# Patient Record
Sex: Female | Born: 1985 | Race: White | Hispanic: No | Smoking: Never smoker
Health system: Southern US, Community
[De-identification: ages and names within clinical notes are randomized; demographics above are authoritative.]

## PROBLEM LIST (undated history)

## (undated) DIAGNOSIS — M545 Low back pain, unspecified: Secondary | ICD-10-CM

## (undated) DIAGNOSIS — K5790 Diverticulosis of intestine, part unspecified, without perforation or abscess without bleeding: Secondary | ICD-10-CM

## (undated) DIAGNOSIS — F419 Anxiety disorder, unspecified: Secondary | ICD-10-CM

## (undated) DIAGNOSIS — E669 Obesity, unspecified: Secondary | ICD-10-CM

## (undated) DIAGNOSIS — F3281 Premenstrual dysphoric disorder: Secondary | ICD-10-CM

## (undated) DIAGNOSIS — G43909 Migraine, unspecified, not intractable, without status migrainosus: Secondary | ICD-10-CM

## (undated) DIAGNOSIS — F32A Depression, unspecified: Secondary | ICD-10-CM

## (undated) DIAGNOSIS — F329 Major depressive disorder, single episode, unspecified: Secondary | ICD-10-CM

## (undated) DIAGNOSIS — K5792 Diverticulitis of intestine, part unspecified, without perforation or abscess without bleeding: Secondary | ICD-10-CM

## (undated) HISTORY — DX: Diverticulitis of intestine, part unspecified, without perforation or abscess without bleeding: K57.92

## (undated) HISTORY — DX: Low back pain, unspecified: M54.50

## (undated) HISTORY — DX: Anxiety disorder, unspecified: F41.9

## (undated) HISTORY — DX: Premenstrual dysphoric disorder: F32.81

## (undated) HISTORY — DX: Migraine, unspecified, not intractable, without status migrainosus: G43.909

## (undated) HISTORY — DX: Low back pain: M54.5

## (undated) HISTORY — DX: Major depressive disorder, single episode, unspecified: F32.9

## (undated) HISTORY — DX: Depression, unspecified: F32.A

## (undated) HISTORY — DX: Diverticulosis of intestine, part unspecified, without perforation or abscess without bleeding: K57.90

## (undated) HISTORY — PX: OTHER SURGICAL HISTORY: SHX169

## (undated) HISTORY — DX: Obesity, unspecified: E66.9

---
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
## 2005-01-25 ENCOUNTER — Inpatient Hospital Stay (HOSPITAL_COMMUNITY): Admission: AD | Admit: 2005-01-25 | Discharge: 2005-01-27 | Payer: Self-pay | Admitting: Obstetrics and Gynecology

## 2005-01-26 ENCOUNTER — Encounter (INDEPENDENT_AMBULATORY_CARE_PROVIDER_SITE_OTHER): Payer: Self-pay | Admitting: Specialist

## 2005-05-10 ENCOUNTER — Other Ambulatory Visit: Admission: RE | Admit: 2005-05-10 | Discharge: 2005-05-10 | Payer: Self-pay | Admitting: Obstetrics and Gynecology

## 2005-05-31 ENCOUNTER — Inpatient Hospital Stay (HOSPITAL_COMMUNITY): Admission: AD | Admit: 2005-05-31 | Discharge: 2005-06-01 | Payer: Self-pay | Admitting: Obstetrics and Gynecology

## 2005-06-10 ENCOUNTER — Inpatient Hospital Stay (HOSPITAL_COMMUNITY): Admission: AD | Admit: 2005-06-10 | Discharge: 2005-06-10 | Payer: Self-pay | Admitting: Obstetrics and Gynecology

## 2006-11-15 ENCOUNTER — Emergency Department: Payer: Self-pay | Admitting: Emergency Medicine

## 2007-01-22 ENCOUNTER — Emergency Department: Payer: Self-pay | Admitting: Internal Medicine

## 2007-02-28 ENCOUNTER — Observation Stay: Payer: Self-pay | Admitting: Obstetrics & Gynecology

## 2007-05-25 ENCOUNTER — Observation Stay: Payer: Self-pay | Admitting: Emergency Medicine

## 2007-07-05 ENCOUNTER — Inpatient Hospital Stay: Payer: Self-pay

## 2007-07-05 ENCOUNTER — Observation Stay: Payer: Self-pay | Admitting: Obstetrics and Gynecology

## 2007-08-27 LAB — CONVERTED CEMR LAB: Pap Smear: NORMAL

## 2007-10-08 ENCOUNTER — Emergency Department: Payer: Self-pay | Admitting: Internal Medicine

## 2008-07-30 ENCOUNTER — Emergency Department: Payer: Self-pay | Admitting: Internal Medicine

## 2008-10-28 ENCOUNTER — Encounter: Payer: Self-pay | Admitting: Family Medicine

## 2008-10-28 LAB — CONVERTED CEMR LAB
Cholesterol: 145 mg/dL
Glucose, Bld: 90 mg/dL
HDL: 49 mg/dL
LDL Cholesterol: 81 mg/dL
Triglycerides: 79 mg/dL

## 2008-11-25 ENCOUNTER — Ambulatory Visit: Payer: Self-pay | Admitting: Family Medicine

## 2008-11-25 DIAGNOSIS — E669 Obesity, unspecified: Secondary | ICD-10-CM | POA: Insufficient documentation

## 2008-11-25 DIAGNOSIS — G43009 Migraine without aura, not intractable, without status migrainosus: Secondary | ICD-10-CM | POA: Insufficient documentation

## 2008-11-25 DIAGNOSIS — J309 Allergic rhinitis, unspecified: Secondary | ICD-10-CM | POA: Insufficient documentation

## 2008-11-25 DIAGNOSIS — L230 Allergic contact dermatitis due to metals: Secondary | ICD-10-CM | POA: Insufficient documentation

## 2009-02-01 ENCOUNTER — Encounter: Payer: Self-pay | Admitting: Family Medicine

## 2009-02-05 ENCOUNTER — Ambulatory Visit: Payer: Self-pay | Admitting: Family Medicine

## 2009-02-05 DIAGNOSIS — H8110 Benign paroxysmal vertigo, unspecified ear: Secondary | ICD-10-CM | POA: Insufficient documentation

## 2009-03-18 ENCOUNTER — Other Ambulatory Visit: Admission: RE | Admit: 2009-03-18 | Discharge: 2009-03-18 | Payer: Self-pay | Admitting: Family Medicine

## 2009-03-18 ENCOUNTER — Ambulatory Visit: Payer: Self-pay | Admitting: Family Medicine

## 2009-03-18 ENCOUNTER — Encounter: Payer: Self-pay | Admitting: Family Medicine

## 2009-03-18 LAB — CONVERTED CEMR LAB
Beta hcg, urine, semiquantitative: NEGATIVE
Bilirubin Urine: NEGATIVE
Glucose, Urine, Semiquant: NEGATIVE
Ketones, urine, test strip: NEGATIVE
Nitrite: NEGATIVE
Specific Gravity, Urine: 1.015
Urobilinogen, UA: 0.2
pH: 6

## 2009-03-22 ENCOUNTER — Encounter (INDEPENDENT_AMBULATORY_CARE_PROVIDER_SITE_OTHER): Payer: Self-pay | Admitting: *Deleted

## 2009-06-08 ENCOUNTER — Ambulatory Visit: Payer: Self-pay | Admitting: Family Medicine

## 2009-06-08 DIAGNOSIS — J069 Acute upper respiratory infection, unspecified: Secondary | ICD-10-CM | POA: Insufficient documentation

## 2009-06-08 DIAGNOSIS — H6980 Other specified disorders of Eustachian tube, unspecified ear: Secondary | ICD-10-CM | POA: Insufficient documentation

## 2009-09-21 ENCOUNTER — Ambulatory Visit: Payer: Self-pay | Admitting: Family Medicine

## 2009-09-21 DIAGNOSIS — M62838 Other muscle spasm: Secondary | ICD-10-CM | POA: Insufficient documentation

## 2009-09-22 LAB — CONVERTED CEMR LAB: TSH: 1.41 microintl units/mL (ref 0.35–5.50)

## 2010-05-12 ENCOUNTER — Ambulatory Visit: Payer: Self-pay | Admitting: Family Medicine

## 2010-05-12 DIAGNOSIS — J310 Chronic rhinitis: Secondary | ICD-10-CM | POA: Insufficient documentation

## 2010-05-24 ENCOUNTER — Ambulatory Visit: Payer: Self-pay | Admitting: Family Medicine

## 2010-05-24 ENCOUNTER — Telehealth (INDEPENDENT_AMBULATORY_CARE_PROVIDER_SITE_OTHER): Payer: Self-pay | Admitting: *Deleted

## 2010-05-27 ENCOUNTER — Ambulatory Visit: Payer: Self-pay | Admitting: Family Medicine

## 2010-05-27 LAB — CONVERTED CEMR LAB
ALT: 19 units/L (ref 0–35)
AST: 16 units/L (ref 0–37)
Albumin: 4.2 g/dL (ref 3.5–5.2)
Alkaline Phosphatase: 50 units/L (ref 39–117)
BUN: 14 mg/dL (ref 6–23)
Bilirubin, Direct: 0.1 mg/dL (ref 0.0–0.3)
CO2: 30 meq/L (ref 19–32)
Calcium: 9 mg/dL (ref 8.4–10.5)
Chloride: 104 meq/L (ref 96–112)
Cholesterol: 163 mg/dL (ref 0–200)
Creatinine, Ser: 0.6 mg/dL (ref 0.4–1.2)
GFR calc non Af Amer: 143.69 mL/min (ref >60)
Glucose, Bld: 69 mg/dL — ABNORMAL LOW (ref 70–99)
HDL: 56.1 mg/dL (ref >39.00)
LDL Cholesterol: 92 mg/dL (ref 0–99)
Potassium: 4.3 meq/L (ref 3.5–5.1)
Sodium: 141 meq/L (ref 135–145)
Total Bilirubin: 1.1 mg/dL (ref 0.3–1.2)
Total CHOL/HDL Ratio: 3
Total Protein: 6.8 g/dL (ref 6.0–8.3)
Triglycerides: 73 mg/dL (ref 0.0–149.0)
VLDL: 14.6 mg/dL (ref 0.0–40.0)

## 2010-08-25 NOTE — Assessment & Plan Note (Signed)
Summary: CPX/DLO   Vital Signs:  Patient profile:   25 year old female Height:      63 inches Weight:      203.38 pounds BMI:     36.16 Temp:     98.3 degrees F oral Pulse rate:   76 / minute Pulse rhythm:   regular BP sitting:   94 / 62  (left arm) Cuff size:   large  Vitals Entered By: Delilah Shan CMA Duncan Dull) (March 18, 2009 11:36 AM) CC: Check Up  -  Pap   History of Present Illness: Vertigo resolved. Stopped phentermine.  Migraines occuring rarely now.  Working on exercisie and healthy eating...weight watchers started at work. Working on diet...with supprot from friends.   Has not had period in 1.5 months. Sexually active. No breast achiness, no N/V, no moodyness. On mirena x 2 years  Not interested in full STD testing, monogomous.   Preventive Screening-Counseling & Management  Alcohol-Tobacco     Alcohol drinks/day: <1     Smoking Status: never  Caffeine-Diet-Exercise     Diet Counseling: to improve diet; diet is suboptimal     Does Patient Exercise: no     Exercise Counseling: not indicated; exercise is adequate  Current Medications (verified): 1)  Mirena 20 Mcg/24hr Iud (Levonorgestrel) 2)  Fluticasone Propionate 50 Mcg/act Susp (Fluticasone Propionate) .... 2 Sprays Per Nostril Daily As Needed 3)  Triamcinolone Acetonide 0.5 % Crea (Triamcinolone Acetonide) .... Aaa Two Times A Day X 2 Weeks 4)  Meclizine Hcl 25 Mg Tabs (Meclizine Hcl) .Marland Kitchen.. 1 Tab By Mouth Q 6 Hours As Needed Vertigo  Allergies (verified): No Known Drug Allergies  Past History:  Past medical, surgical, family and social histories (including risk factors) reviewed, and no changes noted (except as noted below).  Past Medical History: Reviewed history from 11/25/2008 and no changes required. Allergic rhinitis  Family History: Reviewed history from 11/25/2008 and no changes required. father: DM, kidney failure, HTN mother: healthy 2 sisters:healthy except one with migraines 1  brother: healthy PGM:CAD PGF:CAD no cancer in family  Social History: Reviewed history from 11/25/2008 and no changes required. 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  Review of Systems       no breast lumps, no vaginal discharge or itching, no dysuria.  General:  Denies fatigue and fever. CV:  Denies chest pain or discomfort. Resp:  Denies shortness of breath. GI:  Denies abdominal pain, bloody stools, constipation, and diarrhea.  Physical Exam  General:  overweight female in NAD Eyes:  No corneal or conjunctival inflammation noted. EOMI. Perrla. Funduscopic exam benign, without hemorrhages, exudates or papilledema. Vision grossly normal. Ears:  External ear exam shows no significant lesions or deformities.  Otoscopic examination reveals clear canals, tympanic membranes are intact bilaterally without bulging, retraction, inflammation or discharge. Hearing is grossly normal bilaterally. Nose:  External nasal examination shows no deformity or inflammation. Nasal mucosa are pink and moist without lesions or exudates. Mouth:  Oral mucosa and oropharynx without lesions or exudates.  Teeth in good repair. Neck:  no carotid bruit or thyromegaly no cervical or supraclavicular lymphadenopathy  Chest Wall:  No deformities, masses, or tenderness noted. Breasts:  No mass, nodules, thickening, tenderness, bulging, retraction, inflamation, nipple discharge or skin changes noted.   Lungs:  Normal respiratory effort, chest expands symmetrically. Lungs are clear to auscultation, no crackles  or wheezes. Heart:  Normal rate and regular rhythm. S1 and S2 normal without gallop, murmur, click, rub or other extra sounds. Genitalia:  Normal introitus for age, no external lesions, no vaginal discharge, mucosa pink and moist, no vaginal or  cervical lesions, no vaginal atrophy, no friaility or hemorrhage, normal uterus size and position, no adnexal masses or tenderness Msk:  No deformity or scoliosis noted of thoracic or lumbar spine.   Pulses:  R and L posterior tibial pulses are full and equal bilaterally  Extremities:  No clubbing, cyanosis, edema, or deformity noted with normal full range of motion of all joints.   Neurologic:  No cranial nerve deficits noted. Station and gait are normal. Plantar reflexes are down-going bilaterally. DTRs are symmetrical throughout. Sensory, motor and coordinative functions appear intact. Skin:  erythematous papules and excoriations central abdomen right at belt buckle...nowhere else..improved with steroids, but returned...never changed belt or jeans closure  Psych:  Cognition and judgment appear intact. Alert and cooperative with normal attention span and concentration. No apparent delusions, illusions, hallucinations   Impression & Recommendations:  Problem # 1:  Preventive Health Care (ICD-V70.0) Reviewed preventive care protocols, scheduled due services, and updated immunizations. Encouraged exercise, weight loss, healthy eating habits.   Problem # 2:  Gynecological examination-routine (ICD-V72.31) PAP pending. Routine GC/chlam.   Complete Medication List: 1)  Mirena 20 Mcg/24hr Iud (Levonorgestrel) 2)  Fluticasone Propionate 50 Mcg/act Susp (Fluticasone propionate) .... 2 sprays per nostril daily as needed 3)  Triamcinolone Acetonide 0.5 % Crea (Triamcinolone acetonide) .... Aaa two times a day x 2 weeks 4)  Meclizine Hcl 25 Mg Tabs (Meclizine hcl) .Marland Kitchen.. 1 tab by mouth q 6 hours as needed vertigo  Current Allergies (reviewed today): No known allergies     Past Medical History:    Reviewed history from 11/25/2008 and no changes required:       Allergic rhinitis   Laboratory Results   Urine Tests  Date/Time Received: March 18, 2009 12:14 PM   Routine Urinalysis   Color:  yellow Appearance: Hazy Glucose: negative   (Normal Range: Negative) Bilirubin: negative   (Normal Range: Negative) Ketone: negative   (Normal Range: Negative) Spec. Gravity: 1.015   (Normal Range: 1.003-1.035) Blood: trace-lysed   (Normal Range: Negative) pH: 6.0   (Normal Range: 5.0-8.0) Protein: trace   (Normal Range: Negative) Urobilinogen: 0.2   (Normal Range: 0-1) Nitrite: negative   (Normal Range: Negative) Leukocyte Esterace: moderate   (Normal Range: Negative)    Urine HCG: negative

## 2010-08-25 NOTE — Miscellaneous (Signed)
  Clinical Lists Changes  Observations: Added new observation of BG RANDOM: 90 mg/dL (16/04/9603 54:09) Added new observation of LDL: 81 mg/dL (81/19/1478 29:56) Added new observation of HDL: 49 mg/dL (21/30/8657 84:69) Added new observation of TRIGLYC TOT: 79 mg/dL (62/95/2841 32:44) Added new observation of CHOLESTEROL: 145 mg/dL (07/26/7251 66:44) Added new observation of BP DIASTOLIC: 70 mmHg (10/28/2008 03:47) Added new observation of BP SYSTOLIC: 104 mmHg (10/28/2008 14:25)

## 2010-08-25 NOTE — Progress Notes (Signed)
----   Converted from flag ---- ---- 05/24/2010 2:21 PM, Kerby Nora MD wrote: CMEt, lipids Dx v77.91  ---- 05/23/2010 4:06 PM, Mills Koller wrote: This patient is scheduled for CPX with you, I need lab orders with dx, please. Thanks, Terri ------------------------------

## 2010-08-25 NOTE — Assessment & Plan Note (Signed)
Summary: DIZZY/CLE   Vital Signs:  Patient profile:   25 year old female Height:      63 inches Weight:      201 pounds BMI:     35.73 Temp:     98.1 degrees F oral Pulse rate:   80 / minute Pulse rhythm:   regular BP sitting:   112 / 80  (left arm) Cuff size:   large  Vitals Entered By: Delilah Shan CMA (February 05, 2009 9:38 AM) CC: Dizziness, vomiting, diarrhea   History of Present Illness: Recently stopped HCG for weight loss...stopped in 7/1 Has been on phentermine x 2 months  Acute Visit History:      The patient complains of diarrhea, nausea, and vomiting.  These symptoms began one day ago.  She denies abdominal pain, earache, fever, headache, and sinus problems.  Other comments include: Initially began with dizzyness with moving, rolling over in bed...room spinning. This AM nausea and vomited once. One episode of loose BM, watery. No sick contacts. .        Problems Prior to Update: 1)  Contact Dermatitis Due To Metals  (EAV-409.81) 2)  Morbid Obesity  (ICD-278.01) 3)  Migraine, Common  (ICD-346.10) 4)  Allergic Rhinitis  (ICD-477.9)  Current Medications (verified): 1)  Mirena 20 Mcg/24hr Iud (Levonorgestrel) 2)  Fluticasone Propionate 50 Mcg/act Susp (Fluticasone Propionate) .... 2 Sprays Per Nostril Daily As Needed 3)  Triamcinolone Acetonide 0.5 % Crea (Triamcinolone Acetonide) .... Aaa Two Times A Day X 2 Weeks 4)  Phentermine Hcl 37.5 Mg Tabs (Phentermine Hcl) .... Take 1 Tablet By Mouth Once A Day 5)  Meclizine Hcl 25 Mg Tabs (Meclizine Hcl) .Marland Kitchen.. 1 Tab By Mouth Q 6 Hours As Needed Vertigo  Allergies (verified): No Known Drug Allergies  Past History:  Past medical, surgical, family and social histories (including risk factors) reviewed, and no changes noted (except as noted below).  Past Medical History: Reviewed history from 11/25/2008 and no changes required. Allergic rhinitis  Family History: Reviewed history from 11/25/2008 and no changes  required. father: DM, kidney failure, HTN mother: healthy 2 sisters:healthy except one with migraines 1 brother: healthy PGM:CAD PGF:CAD no cancer in family  Social History: Reviewed history from 11/25/2008 and no changes required. 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  Review of Systems General:  Denies fatigue, fever, and loss of appetite. CV:  Denies chest pain or discomfort. Resp:  Denies shortness of breath.  Physical Exam  General:  Well-developed,well-nourished,in no acute distress; alert,appropriate and cooperative throughout examination Head:  no maxillary sinus ttp Eyes:  No corneal or conjunctival inflammation noted. EOMI. Perrla. Funduscopic exam benign, without hemorrhages, exudates or papilledema. Vision grossly normal. Nystagmus horizontal noted with Gilberto Better.  Ears:  External ear exam shows no significant lesions or deformities.  Otoscopic examination reveals clear canals, tympanic membranes are intact bilaterally without bulging, retraction, inflammation or discharge. Hearing is grossly normal bilaterally. Nose:  External nasal examination shows no deformity or inflammation. Nasal mucosa are pink and moist without lesions or exudates. Mouth:  MMM Neck:  no carotid bruit or thyromegaly no cervical or supraclavicular lymphadenopathy  Lungs:  Normal respiratory effort, chest expands symmetrically. Lungs are clear to auscultation, no crackles or wheezes. Heart:  Normal rate and regular rhythm. S1 and S2 normal without gallop, murmur, click, rub or other extra sounds. Abdomen:  Bowel sounds positive,abdomen soft and non-tender without masses, organomegaly or hernias noted.   Impression & Recommendations:  Problem # 1:  BENIGN POSITIONAL VERTIGO (ICD-386.11) Discussed likely diagnosis in detail.  Possibly due to phentermine...recommend stopping. ? if diarrhea from phentermine as well. Info on moveemnt exercises, use meclizine as needed. Call if not improving in 2 weeks.  The following medications were removed from the medication list:    Claritin 10 Mg Caps (Loratadine) .Marland Kitchen... Take 1 tablet by mouth once a day Her updated medication list for this problem includes:    Meclizine Hcl 25 Mg Tabs (Meclizine hcl) .Marland Kitchen... 1 tab by mouth q 6 hours as needed vertigo  Complete Medication List: 1)  Mirena 20 Mcg/24hr Iud (Levonorgestrel) 2)  Fluticasone Propionate 50 Mcg/act Susp (Fluticasone propionate) .... 2 sprays per nostril daily as needed 3)  Triamcinolone Acetonide 0.5 % Crea (Triamcinolone acetonide) .... Aaa two times a day x 2 weeks 4)  Phentermine Hcl 37.5 Mg Tabs (Phentermine hcl) .... Take 1 tablet by mouth once a day 5)  Meclizine Hcl 25 Mg Tabs (Meclizine hcl) .Marland Kitchen.. 1 tab by mouth q 6 hours as needed vertigo  Patient Instructions: 1)  Stop phentermine. 2)  Use meclize as needed severe symptoms..willl be sedating. 3)  Info on movement exercsies given. Call if not improving in 2 weeks.  Prescriptions: MECLIZINE HCL 25 MG TABS (MECLIZINE HCL) 1 tab by mouth q 6 hours as needed vertigo  #20 x 0   Entered and Authorized by:   Kerby Nora MD   Signed by:   Kerby Nora MD on 02/05/2009   Method used:   [facility] to        [facility]  [address] (retail)       [address]       [place], [place]  [address]       Ph: [phone] or [phone]       Fax: [phone]   RxID:   [id number]   Current Allergies (reviewed today): No known allergies

## 2010-08-25 NOTE — Assessment & Plan Note (Signed)
Summary: NEW PATIENT / LFW   Vital Signs:  Patient profile:   25 year old female Height:      63 inches Weight:      224.13 pounds BMI:     39.85 Temp:     98.4 degrees F oral Pulse rate:   76 / minute Pulse rhythm:   regular BP sitting:   102 / 70  (left arm) Cuff size:   large  Vitals Entered By: Delilah Shan (Nov 25, 2008 10:00 AM)  History of Present Illness: Chief Complaint:  New Patient to Establish   Here to establish, but has the following concerns:  Increasing frequency of headaches as well as poorly controlled allergies. She has not tried any antihistamines.  headaches x years, but more frequewnt in alst month daily to weekly. Feels pain behind eyes, B and tightness in neck. Occ nausea, has photophonophobia. Has recently gotten new glasses prescription. Skips meals. Has frequent congestion, itchy throat, sneezing ...triggers headache.    Mirena placed 08/2007  Health screening at work last month...cholesterol normal, neg DM screen. Has appt with bariatric clinic.  Preventive Screening-Counseling & Management     Smoking Status: never     Does Patient Exercise: no      Drug Use:  no.    Allergies (verified): No Known Drug Allergies  Past History:  Past Medical History:    Allergic rhinitis  Family History:    father: DM, kidney failure, HTN    mother: healthy    2 sisters:healthy except one with migraines    1 brother: healthy    PGM:CAD    PGF:CAD    no cancer in family  Social History:    Occupation: Clinical biochemist    Married    2 children: healthy    Never Smoked    Alcohol use-yes, 1-2 drink every other week    Drug use-no    Regular exercise-no    Diet; fruits and veggies..but just eats 1 meal a day.    Occupation:  employed    Smoking Status:  never    Drug Use:  no    Does Patient Exercise:  no  Review of Systems       rash on stomach, intermitant puritis General:  Denies fatigue and fever. CV:  Denies chest pain or  discomfort, fatigue, leg cramps with exertion, lightheadness, palpitations, and swelling of feet; occ has swelling in hand..with walking during walking x 4 months, intermittant. Resp:  Denies shortness of breath. Derm:  Denies rash.  Physical Exam  General:  obese appearin female in NAD Eyes:  No corneal or conjunctival inflammation noted. EOMI. Perrla. Funduscopic exam benign, without hemorrhages, exudates or papilledema. Vision grossly normal. Ears:  External ear exam shows no significant lesions or deformities.  Otoscopic examination reveals clear canals, tympanic membranes are intact bilaterally without bulging, retraction, inflammation or discharge. Hearing is grossly normal bilaterally. Nose:  nasal dischargemucosal pallor.   Mouth:  Oral mucosa and oropharynx without lesions or exudates.  Teeth in good repair. Neck:  no carotid bruit or thyromegaly  no cervical or supraclavicular lymphadenopathy  Lungs:  Normal respiratory effort, chest expands symmetrically. Lungs are clear to auscultation, no crackles or wheezes. Heart:  Normal rate and regular rhythm. S1 and S2 normal without gallop, murmur, click, rub or other extra sounds. Abdomen:  Bowel sounds positive,abdomen soft and non-tender without masses, organomegaly or hernias noted. Pulses:  R and L posterior tibial pulses are full and equal bilaterally  Extremities:  no edema Neurologic:  No cranial nerve deficits noted. Station and gait are  Plantar reflexes are down-going bilaterally. DTRs are symmetrical throughout. Sensory, motor and coordinative functions appear intact. Skin:  erythematous papules and excoriations central abdomen right at belt buckle...nowhere else   Impression & Recommendations:  Problem # 1:  ALLERGIC RHINITIS (ICD-477.9) Start OTC antihoistamine ans nasal steroid.  Her updated medication list for this problem includes:    Fluticasone Propionate 50 Mcg/act Susp (Fluticasone propionate) .Marland Kitchen... 2 sprays per  nostril daily    Claritin 10 Mg Caps (Loratadine) .Marland Kitchen... Take 1 tablet by mouth once a day  Problem # 2:  MIGRAINE, COMMON (ICD-346.10) Info given on migrianes and triggers. Encouraged regular meals, increasing water intake, good sleep. Will keep headache diary. Treat allergy trigger as above. Follow up in 3-4 weeks.   Problem # 3:  CONTACT DERMATITIS DUE TO METALS (VHQ-469.62) Treat with topical steroid and removal of allergen.  Her updated medication list for this problem includes:    Claritin 10 Mg Caps (Loratadine) .Marland Kitchen... Take 1 tablet by mouth once a day    Triamcinolone Acetonide 0.5 % Crea (Triamcinolone acetonide) .Marland Kitchen... Aaa two times a day x 2 weeks  Complete Medication List: 1)  Mirena 20 Mcg/24hr Iud (Levonorgestrel) 2)  Fluticasone Propionate 50 Mcg/act Susp (Fluticasone propionate) .... 2 sprays per nostril daily 3)  Claritin 10 Mg Caps (Loratadine) .... Take 1 tablet by mouth once a day 4)  Triamcinolone Acetonide 0.5 % Crea (Triamcinolone acetonide) .... Aaa two times a day x 2 weeks  Patient Instructions: 1)  Schedule CPE with pap. 2)  Stop wearing belt buckle if possible, cover metal on jeans. 3)  Use steroid creasm two times a day x 2 weeks, call if not improving. 4)  Eat 3 meals a day. 5)  keep headache diary, review triggers. 6)  Start claritin or zyrtec and nasal steroid for allergies. Follow up in 3-4 weeks.  Prescriptions: TRIAMCINOLONE ACETONIDE 0.5 % CREA (TRIAMCINOLONE ACETONIDE) AAA two times a day x 2 weeks  #15 gm x 1   Entered and Authorized by:   Kerby Nora MD   Signed by:   Kerby Nora MD on 11/25/2008   Method used:   [facility]        [id number]       [address]       [place], [place]  [address]       Ph: [phone] or [phone]       Fax: [phone]   RxID:   [id number] FLUTICASONE PROPIONATE 50 MCG/ACT SUSP (FLUTICASONE PROPIONATE) 2 sprays per nostril daily  #1 x 3   Entered and Authorized by:    Kerby Nora MD   Signed by:   Kerby Nora MD on 11/25/2008   Method used:   [facility]        [facility] [id number]       [address]       [place], [place]  [address]       Ph: [phone] or [phone]       Fax: [phone]   RxID:   [id number]     Prior Medications (reviewed today): None Current Allergies (reviewed today): No known allergies  TD Result Date:  01/21/2005 TD Result:  given TD Next Due:  10 yr PAP Result Date:  08/27/2007 PAP Result:  normal PAP Next Due:  1 yr Chlamydia Result Date:  08/27/2007 Chlamydia Result:  normal Chlamydia Next Due:  1 yr

## 2010-08-25 NOTE — Assessment & Plan Note (Signed)
Summary: FEELING LIGHT HEADED & MIGRANES / LFW   Vital Signs:  Patient profile:   25 year old female Height:      63 inches Weight:      203.4 pounds BMI:     36.16 Temp:     97.9 degrees F oral Pulse rate:   76 / minute Pulse rhythm:   regular BP sitting:   110 / 70  (left arm) Cuff size:   large  Vitals Entered By: Benny Lennert CMA Duncan Dull) (June 08, 2009 3:40 PM)  History of Present Illness: Chief complaint feeling light headed and migraines  3 days ago migraine. Has some URI symptoms...nasal congestion, deep cough, productive (keeping her up at night), no ear pain, no fever, no SOB. Daughters are sick.  3 days of lightheadeness.. does ot feel likel room spinning..like in past with vertigo (this is not as bad). Has not been very hungry ..drinking liquids.   No heavy periods. Prior to above symtpoms she had been feeling well, no fatigue. Has taken some Nyquil and cold medicine.Marland Kitchenanalgesic, decongestant cough suppressant combo.   No sneeze, no itchy eyes.    Problems Prior to Update: 1)  Routine Gynecological Examination  (ICD-V72.31) 2)  Physical Examination  (ICD-V70.0) 3)  Benign Positional Vertigo  (ICD-386.11) 4)  Contact Dermatitis Due To Metals  (AOZ-308.65) 5)  Morbid Obesity  (ICD-278.01) 6)  Migraine, Common  (ICD-346.10) 7)  Allergic Rhinitis  (ICD-477.9)  Current Medications (verified): 1)  Mirena 20 Mcg/24hr Iud (Levonorgestrel)  Allergies (verified): No Known Drug Allergies  Past History:  Past medical, surgical, family and social histories (including risk factors) reviewed for relevance to current acute and chronic problems.  Past Medical History: Reviewed history from 11/25/2008 and no changes required. Allergic rhinitis  Family History: Reviewed history from 11/25/2008 and no changes required. father: DM, kidney failure, HTN mother: healthy 2 sisters:healthy except one with migraines 1 brother: healthy PGM:CAD PGF:CAD no cancer in  family  Social History: Reviewed history from 11/25/2008 and no changes required. 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  Review of Systems      See HPI CV:  Denies chest pain or discomfort. GI:  Denies abdominal pain and bloody stools.  Physical Exam  General:  Well-developed,well-nourished,in no acute distress; alert,appropriate and cooperative throughout examination Head:  no maxillary ttp Ears:  clear yellowish fluid B TMs, no erythema, no bulging Nose:  nasal discharge, no mucosal pallor.   Mouth:  Oral mucosa and oropharynx without lesions or exudates.  Teeth in good repair. MMM Neck:  no carotid bruit or thyromegaly no cervical or supraclavicular lymphadenopathy  Lungs:  Normal respiratory effort, chest expands symmetrically. Lungs are clear to auscultation, no crackles or wheezes. Heart:  Normal rate and regular rhythm. S1 and S2 normal without gallop, murmur, click, rub or other extra sounds. Pulses:  R and L posterior tibial pulses are full and equal bilaterally  Extremities:  no edema   Impression & Recommendations:  Problem # 1:  URI (ICD-465.9) Treat symptomatically. No clear sign of bactrerial infection.   Problem # 2:  EUSTACHIAN TUBE DYSFUNCTION, BILATERAL (ICD-381.81) Likely cause of equilibrium issues... treatr with decongestant, mucolytic and nasal saline. Stop Nyquil, will not prescribe prescription cough suppressant to avoid ? SE.   No clearly BPPV as she has had in past.   Complete Medication List: 1)  Mirena 20 Mcg/24hr Iud (Levonorgestrel)  Patient Instructions: 1)  Mucinex D two times a day . 2)  Nasal saline 2-3 times a day. 3)  Call if not improving in 7-10 days or if symptoms worsening.   Current Allergies (reviewed today): No known allergies

## 2010-08-25 NOTE — Letter (Signed)
Summary: Results Follow up Letter  [facility]  [address] [place], [place] [address]   Phone: [phone]  Fax: [phone]    03/22/2009 MRN: [id number]    Tiffany Donovan [address] [place], [place]  [address]    Dear Ms. SHAW,  The following are the results of your recent test(s):  Test         Result    Pap Smear:        Normal __X___  Not Normal _____ Comments:  Yearly follow up is recommended. ______________________________________________________ Cholesterol: LDL(Bad cholesterol):         Your goal is less than:         HDL (Good cholesterol):       Your goal is more than: Comments:  ______________________________________________________ Mammogram:        Normal _____  Not Normal _____ Comments:  ___________________________________________________________________ Hemoccult:        Normal _____  Not normal _______ Comments:    _____________________________________________________________________ Other Tests:  GC/Chlamydia tests are negative as well.    We routinely do not discuss normal results over the telephone.  If you desire a copy of the results, or you have any questions about this information we can discuss them at your next office visit.   Sincerely,   Excell Seltzer, M.D.  AEB:lsf

## 2010-08-25 NOTE — Assessment & Plan Note (Signed)
Summary: LEFT SIDE OF FACE SWOLLEN/DLO   Vital Signs:  Patient profile:   25 year old female Height:      63 inches Weight:      237.25 pounds BMI:     42.18 Temp:     97.8 degrees F oral Pulse rate:   80 / minute Pulse rhythm:   regular BP sitting:   98 / 70  (left arm) Cuff size:   large  Vitals Entered By: Delilah Shan CMA Valente Fosberg Dull) (May 12, 2010 4:25 PM) CC: Left side of face swollen x 3 days, progressively getting worse.   History of Present Illness: L side of face puffy.  Under L eye, L lateral portion of nose. Also slightly ttp under L angle of mandible.  No FCNAV. No ear pain.  +rhinorrhea, no ST.  No change in tongue.  No cough.  L eye watering.  No trouble swallowing.  Last night the L side of the nose felt warm.   no increase in wob. No vision change.  No change in eye movement.   No similar symptoms prev.  Rare ibuprofen use.  No new meds.  No known triggers.    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  Review of Systems       See HPI.  Otherwise negative.    Physical Exam  General:  GEN: nad, alert and oriented HEENT: mucous membranes moist, TM wnl bilateral, EOMI bilateral, fundus wnl bilateral, L lateral portion of nostril slightly edematous but not erythematous.  No fluctuance.  sinuses not tender to percussion. OP w/o exudates.   NECK: supple w/o LA.  She is slightly tender to palpation on neck inf to L angle of jaw.  No mass appreciated.  no stidor CV: rrr.  no murmur PULM: ctab, no inc wob ABD: soft, +bs EXT: no edema SKIN: no acute rash  CN 2-12 wnl B   Impression & Recommendations:  Problem # 1:  RHINITIS (ICD-472.0) Unclear source.  I think this is likely a viral URI with rhinitis and congestion of nasal mucosa.  This has  probably compressed the tear duct and caused the watering in the eye.  She may have a tender node on the L side of the neck that I can't fully palpate.  I would observe as patient is nontoxic.  Call back Monday.  If not improving, consider ENT eval.  If resolving, no other work up.  If fever, SOB, erythema, change in vision over the next few days, then proceed to ER. She understands.    Complete Medication List: 1)  Mirena 20 Mcg/24hr Iud (Levonorgestrel) 2)  Ibuprofen 800 Mg Tabs (Ibuprofen) .Marland Kitchen.. 1 tab by mouth up to  three times daily as needed neck pain.  Patient Instructions: 1)  Call me with an update on Monday.  If you have any fever, redness, change in swallowing/breathing/vision over the weekend then go to the ER.  I think this will likely resolve on its own.  Take care.    Orders Added: 1)  Est. Patient Level III [98119]    Current Allergies (reviewed today): No known allergies

## 2010-08-25 NOTE — Assessment & Plan Note (Signed)
Summary: pain in neck and shoulders/ alc   Vital Signs:  Patient profile:   25 year old female Height:      63 inches Weight:      221.0 pounds BMI:     39.29 Temp:     98.1 degrees F oral Pulse rate:   76 / minute Pulse rhythm:   regular BP sitting:   100 / 70  (left arm) Cuff size:   large  Vitals Entered By: Benny Lennert CMA Duncan Dull) (September 21, 2009 9:29 AM)  History of Present Illness: Chief complaint pain in neck and shoulders for 1 month  Work at computer every day all day. In last month: Has central upper back pain and pain in B uppershoulders. No fall, no injury.  Occ use excedrine, massage..helps some, but then it comes back. Worse at nightand at work.  No headache. No numbness, no tingling in arms. No weakness in arms. No fever, no incontinence. no N/V.   Worried about weight ...walks at lunch, plays with kids outside.   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  Past Medical History: Reviewed history from 11/25/2008 and no changes required. Allergic rhinitis  Family History: Reviewed history from 11/25/2008 and no changes required. father: DM, kidney failure, HTN mother: healthy 2 sisters:healthy except one with migraines 1 brother: healthy PGM:CAD PGF:CAD no cancer in family  Social History: Reviewed history from  11/25/2008 and no changes required. 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  Review of Systems General:  Complains of fatigue; denies fever. CV:  Denies chest pain or discomfort. Resp:  Denies shortness of breath. GI:  Denies abdominal pain. Neuro:  Denies difficulty with concentration, falling down, headaches, and poor balance. Endo:  Complains of weight change; denies cold intolerance and heat intolerance.  Physical Exam  General:  morbidly opbese appearing female iN NAD Mouth:  MMM Neck:  no carotid bruit or thyromegaly no cervical or supraclavicular lymphadenopathy  Lungs:  Normal respiratory effort, chest expands symmetrically. Lungs are clear to auscultation, no crackles or wheezes. Heart:  Normal rate and regular rhythm. S1 and S2 normal without gallop, murmur, click, rub or other extra sounds. Msk:  ttp B trapeziu, no vertebral tenderness.  neg [place] Neurologic:  No cranial nerve deficits noted. Station and gait are normal. DTRs are symmetrical throughout. Sensory, motor and coordinative functions appear intact.   Impression & Recommendations:  Problem # 1:  MUSCLE SPASM, TRAPEZIUS (ICD-728.85) No evidence of radiuculopathy. Likely caused by poor posture and ergonomics at work.  NSAID, heat, massage and stretching exercsies. Follow up if not improving in 2 weeks.   Problem # 2:  MORBID OBESITY (ICD-278.01) Spent 30 min face to face with pt conunseling on diet and wexecise changes. Cautioned her about stimulant use. Consider alli. Return  for weight check in 2 months.  Orders: TLB-TSH (Thyroid Stimulating Hormone) (84443-TSH)  Complete Medication List: 1)  Mirena 20 Mcg/24hr Iud (Levonorgestrel) 2)  Ibuprofen 800 Mg Tabs (Ibuprofen) .Marland Kitchen.. 1 tab by mouth up to  three times daily as needed neck pain.  Patient  Instructions: 1)  Follow up in 2 weeks if no improvement. 2)  Heat, massage, ibuprofen as needed pain. 3)  Start stretching exercsies. Look into correct ergonomics at works.  4)  Sit up straight. 5)  Increase exercsie.  6)  Try Alli.  7)  Follow upin 2 months for appt with Alexina Niccoli weight check. Prescriptions: IBUPROFEN 800 MG TABS (IBUPROFEN) 1 tab by mouth up to  three times daily as needed neck pain.  #30 x 0   Entered and Authorized by:   Kerby Nora MD   Signed by:   Kerby Nora MD on 09/21/2009   Method used:   Elect[facility] to        [facility]  [address] (retail)       [address]       [place], [place]  [address]       Ph: [phone] or [phone]       Fax: [phone]   RxID:   [id number]   Current Allergies (reviewed today): No known allergies

## 2010-09-03 ENCOUNTER — Inpatient Hospital Stay (INDEPENDENT_AMBULATORY_CARE_PROVIDER_SITE_OTHER)
Admission: RE | Admit: 2010-09-03 | Discharge: 2010-09-03 | Disposition: A | Discharge status: Home / Self Care | Payer: BC Managed Care – PPO | Source: Ambulatory Visit | Attending: Emergency Medicine | Admitting: Emergency Medicine

## 2010-09-03 DIAGNOSIS — J029 Acute pharyngitis, unspecified: Secondary | ICD-10-CM

## 2010-09-03 DIAGNOSIS — B9789 Other viral agents as the cause of diseases classified elsewhere: Secondary | ICD-10-CM

## 2010-09-03 LAB — POCT INFECTIOUS MONO SCREEN: Mono Screen: NEGATIVE

## 2010-09-03 LAB — POCT RAPID STREP A (OFFICE): Streptococcus, Group A Screen (Direct): NEGATIVE

## 2010-12-09 NOTE — H&P (Signed)
NAME:  Tiffany Donovan, SLADEK                  ACCOUNT NO.:  [id number]   MEDICAL RECORD NO.:  [id number]          PATIENT TYPE:  INP   LOCATION:  9168                          FACILITY:  [facility]   PHYSICIAN:  Naima A. Dillard, M.D. DATE OF BIRTH:  27-Aug-1985   DATE OF ADMISSION:  01/25/2005  DATE OF DISCHARGE:                                HISTORY & PHYSICAL   This is a 25 year old gravida 1, para 0 at 39-1/7 weeks who presents with  pregnancy induced hypertension.  Patient was seen in the office with blood  pressure 130/90 with 1+ proteinuria.  She denies any headache or blurred  vision.  Pregnancy has been followed by Dr. Normand Sloop and remarkable for  unsure LMP, first trimester spotting, increased risk Down syndrome.   OBSTETRICAL HISTORY:  Patient is primigravida.   MEDICAL HISTORY:  Remarkable for a bladder infection 2001, otherwise  healthy.   FAMILY HISTORY:  Remarkable for grandmother and grandfather with deep venous  thrombosis, father and mother with diabetes.   GENETIC HISTORY:  Unremarkable other than current pregnancy findings.   SOCIAL HISTORY:  Patient is recently married to Jeanella Cara who is involved  and supportive.  She is of the [place] faith.  She works as a Conservation officer, nature.  She denies any alcohol, tobacco, or drug use.   PRENATAL LABORATORIES:  Hemoglobin 11.3, platelets 257.  Blood type O+.  Antibody screen negative.  RPR nonreactive.  Rubella equivocal.  Hepatitis  negative.  HIV negative.  Pap test normal.  Gonorrhea negative.  Chlamydia  negative.  Varicella positive.   HISTORY OF CURRENT PREGNANCY:  Patient entered care at [redacted] weeks gestation.  She had an abnormal AFP showing increased Down syndrome.  She had an  ultrasound at 17 weeks showing echogenic intracaradiac focus bringing her  risk 1 to 20 for Down syndrome.  She was seen by Dr. Su Hilt and counseled  on amniocentesis which she declined.  She had a fetal echocardiogram at 21  weeks showing structurally normal  fetal heart with echogenic foci in left  ventricle.  She had some brown mucus discharge at 25 weeks with a negative  work-up.  She had a Glucola at 28 weeks which was normal.  Ultrasound at 28  weeks showed EIF was less prominent with lateral ventricles now upper limit  of normal.  Growth was 92-93%.  She was counseled by Dr. Normand Sloop and offered  a perinatal consult.  She presents today with elevated blood pressure and is  also group B Strep positive.   OBJECTIVE DATA:  VITAL SIGNS:  Stable.  Blood pressure here 135/66.  HEENT:  Within normal limits.  NECK:  Thyroid normal, not enlarged.  CHEST:  Clear to auscultation.  HEART:  Regular rate and rhythm.  ABDOMEN:  Gravid, 39 cm.  Vertex to Leopold's.  Fetal heart tones 135-140  with accelerations.  Uterine contractions every two to three minutes.  Cervix is 3 cm, 70% effaced, and -2 station.  Amniotomy was performed by Dr.  Normand Sloop for clear fluid.  EXTREMITIES:  2+ edema with 2+ DTRs bilaterally.  ASSESSMENT:  1.  Intrauterine pregnancy at 39-1/7 weeks.  2.  Pregnancy induced hypertension.   PLAN:  1.  Admit to birthing suites per Dr. Normand Sloop.  PCN G for GBS prophlaxsis  2.  Check pih labs and ua if consistent with preeclampsia will begin      Magnesium sulfate.  3.  Induction of labor with Pitocin.  Further is to follow.       MLW/MEDQ  D:  01/25/2005  T:  01/25/2005  Job:  [id number]

## 2011-02-13 ENCOUNTER — Telehealth: Payer: Self-pay | Admitting: Family Medicine

## 2011-02-13 DIAGNOSIS — R5381 Other malaise: Secondary | ICD-10-CM

## 2011-02-13 DIAGNOSIS — Z13220 Encounter for screening for lipoid disorders: Secondary | ICD-10-CM

## 2011-02-13 NOTE — Telephone Encounter (Signed)
Message copied by Excell Seltzer on Mon Feb 13, 2011  7:58 AM ------      Message from: Baldomero Lamy      Created: Tue Feb 07, 2011  1:12 PM      Regarding: Cpx labs tues       Please order  future cpx labs for pt's upcomming lab appt.      Thanks      Rodney Booze

## 2011-02-13 NOTE — Telephone Encounter (Signed)
ASk pt if she is interested in STD testing.. We can ad if needed.

## 2011-02-14 ENCOUNTER — Other Ambulatory Visit (INDEPENDENT_AMBULATORY_CARE_PROVIDER_SITE_OTHER): Payer: BC Managed Care – PPO | Admitting: Family Medicine

## 2011-02-14 DIAGNOSIS — Z113 Encounter for screening for infections with a predominantly sexual mode of transmission: Secondary | ICD-10-CM

## 2011-02-14 DIAGNOSIS — Z13220 Encounter for screening for lipoid disorders: Secondary | ICD-10-CM

## 2011-02-14 DIAGNOSIS — R5381 Other malaise: Secondary | ICD-10-CM

## 2011-02-14 DIAGNOSIS — R5383 Other fatigue: Secondary | ICD-10-CM

## 2011-02-14 LAB — CBC WITH DIFFERENTIAL/PLATELET
Basophils Absolute: 0 10*3/uL (ref 0.0–0.1)
Basophils Relative: 0.6 % (ref 0.0–3.0)
Eosinophils Absolute: 0.1 10*3/uL (ref 0.0–0.7)
Eosinophils Relative: 3 % (ref 0.0–5.0)
HCT: 36.6 % (ref 36.0–46.0)
Hemoglobin: 12.7 g/dL (ref 12.0–15.0)
Lymphocytes Relative: 31.7 % (ref 12.0–46.0)
Lymphs Abs: 1.4 10*3/uL (ref 0.7–4.0)
MCHC: 34.8 g/dL (ref 30.0–36.0)
MCV: 90.5 fl (ref 78.0–100.0)
Monocytes Absolute: 0.2 10*3/uL (ref 0.1–1.0)
Monocytes Relative: 5.3 % (ref 3.0–12.0)
Neutro Abs: 2.7 10*3/uL (ref 1.4–7.7)
Neutrophils Relative %: 59.4 % (ref 43.0–77.0)
Platelets: 219 10*3/uL (ref 150.0–400.0)
RBC: 4.04 Mil/uL (ref 3.87–5.11)
RDW: 13.9 % (ref 11.5–14.6)
WBC: 4.5 10*3/uL (ref 4.5–10.5)

## 2011-02-14 LAB — LIPID PANEL
Cholesterol: 156 mg/dL (ref 0–200)
HDL: 62.6 mg/dL (ref >39.00)
LDL Cholesterol: 81 mg/dL (ref 0–99)
Total CHOL/HDL Ratio: 2
Triglycerides: 60 mg/dL (ref 0.0–149.0)
VLDL: 12 mg/dL (ref 0.0–40.0)

## 2011-02-14 LAB — COMPREHENSIVE METABOLIC PANEL
ALT: 11 U/L (ref 0–35)
AST: 12 U/L (ref 0–37)
Albumin: 4.4 g/dL (ref 3.5–5.2)
Alkaline Phosphatase: 38 U/L — ABNORMAL LOW (ref 39–117)
BUN: 11 mg/dL (ref 6–23)
CO2: 26 mEq/L (ref 19–32)
Calcium: 8.7 mg/dL (ref 8.4–10.5)
Chloride: 111 mEq/L (ref 96–112)
Creatinine, Ser: 0.6 mg/dL (ref 0.4–1.2)
GFR: 126.76 mL/min (ref >60.00)
Glucose, Bld: 80 mg/dL (ref 70–99)
Potassium: 3.9 mEq/L (ref 3.5–5.1)
Sodium: 142 mEq/L (ref 135–145)
Total Bilirubin: 1 mg/dL (ref 0.3–1.2)
Total Protein: 7.1 g/dL (ref 6.0–8.3)

## 2011-02-14 NOTE — Progress Notes (Signed)
Addended by: Alvina Chou on: 02/14/2011 12:18 PM   Modules accepted: Orders

## 2011-02-15 LAB — RPR

## 2011-02-15 LAB — HEPATITIS PANEL, ACUTE
HCV Ab: NEGATIVE
Hep A IgM: NEGATIVE
Hep B C IgM: NEGATIVE
Hepatitis B Surface Ag: NEGATIVE

## 2011-02-15 LAB — HIV ANTIBODY (ROUTINE TESTING W REFLEX): HIV: NONREACTIVE

## 2011-02-17 ENCOUNTER — Encounter: Payer: Self-pay | Admitting: Family Medicine

## 2011-02-21 ENCOUNTER — Ambulatory Visit (INDEPENDENT_AMBULATORY_CARE_PROVIDER_SITE_OTHER): Payer: BC Managed Care – PPO | Admitting: Family Medicine

## 2011-02-21 ENCOUNTER — Encounter: Payer: Self-pay | Admitting: Family Medicine

## 2011-02-21 ENCOUNTER — Other Ambulatory Visit (HOSPITAL_COMMUNITY)
Admission: RE | Admit: 2011-02-21 | Discharge: 2011-02-21 | Disposition: A | Discharge status: Home / Self Care | Payer: BC Managed Care – PPO | Source: Ambulatory Visit | Attending: Family Medicine | Admitting: Family Medicine

## 2011-02-21 DIAGNOSIS — N926 Irregular menstruation, unspecified: Secondary | ICD-10-CM | POA: Insufficient documentation

## 2011-02-21 DIAGNOSIS — F329 Major depressive disorder, single episode, unspecified: Secondary | ICD-10-CM

## 2011-02-21 DIAGNOSIS — Z0000 Encounter for general adult medical examination without abnormal findings: Secondary | ICD-10-CM | POA: Insufficient documentation

## 2011-02-21 DIAGNOSIS — R5381 Other malaise: Secondary | ICD-10-CM

## 2011-02-21 DIAGNOSIS — R5383 Other fatigue: Secondary | ICD-10-CM

## 2011-02-21 DIAGNOSIS — F320 Major depressive disorder, single episode, mild: Secondary | ICD-10-CM

## 2011-02-21 DIAGNOSIS — Z01419 Encounter for gynecological examination (general) (routine) without abnormal findings: Secondary | ICD-10-CM

## 2011-02-21 DIAGNOSIS — F3281 Premenstrual dysphoric disorder: Secondary | ICD-10-CM | POA: Insufficient documentation

## 2011-02-21 NOTE — Progress Notes (Signed)
Subjective:    Patient ID: Tiffany Donovan, female    DOB: 1986/06/14, 25 y.o.   MRN: [id number]  HPI  The patient is here for annual wellness exam and preventative care.    She has had a mirena IUD in place for about 4 years. She wishes to have this removed.  No specific concern except it makes her menses irregular with the mirena. Menses lasting longer and occ cramping with last menses. Had no period until 10/2010 Mirena was placed for birth control not other reasons. Not currently sexually active.  Difficulty losing weight: has cut out fast food, increase fruit and veggies. Occ exercise, was doing this 3 days a week until recently.       Review of Systems  Constitutional: Positive for fatigue and unexpected weight change. Negative for fever.       Difficulty losing weight in past few years.  HENT: Negative for ear pain.   Respiratory: Negative for shortness of breath.   Cardiovascular: Negative for chest pain.  Gastrointestinal: Negative for nausea, abdominal pain, diarrhea and constipation.  Genitourinary: Positive for menstrual problem. Negative for dysuria, vaginal pain and pelvic pain.  Skin:       No dry skin  Neurological:       No hot cold intolerance  Psychiatric/Behavioral: Negative for suicidal ideas and sleep disturbance.       Has been more emotional, moody, irritable. Has been under more stress. More sensitive to thing recently.        Objective:   Physical Exam  Constitutional: Vital signs are normal. She appears well-developed and well-nourished. She is cooperative.  Non-toxic appearance. She does not appear ill. No distress.  HENT:  Head: Normocephalic.  Right Ear: Hearing, tympanic membrane, external ear and ear canal normal.  Left Ear: Hearing, tympanic membrane, external ear and ear canal normal.  Nose: Nose normal.  Eyes: Conjunctivae, EOM and lids are normal. Pupils are equal, round, and reactive to light. No foreign bodies found.  Neck: Trachea  normal and normal range of motion. Neck supple. Carotid bruit is not present. No mass and no thyromegaly present.  Cardiovascular: Normal rate, regular rhythm, S1 normal, S2 normal, normal heart sounds and intact distal pulses.  Exam reveals no gallop.   No murmur heard. Pulmonary/Chest: Effort normal and breath sounds normal. No respiratory distress. She has no wheezes. She has no rhonchi. She has no rales.  Abdominal: Soft. Normal appearance and bowel sounds are normal. She exhibits no distension, no fluid wave, no abdominal bruit and no mass. There is no hepatosplenomegaly. There is no tenderness. There is no rebound, no guarding and no CVA tenderness. No hernia.  Genitourinary: Vagina normal and uterus normal. No breast swelling, tenderness, discharge or bleeding. Pelvic exam was performed with patient prone. There is no rash, tenderness or lesion on the right labia. There is no rash, tenderness or lesion on the left labia. Uterus is not enlarged and not tender. Cervix exhibits no motion tenderness, no discharge and no friability. Right adnexum displays no mass, no tenderness and no fullness. Left adnexum displays no mass, no tenderness and no fullness.  Lymphadenopathy:    She has no cervical adenopathy.    She has no axillary adenopathy.  Neurological: She is alert. She has normal strength. No cranial nerve deficit or sensory deficit.  Skin: Skin is warm, dry and intact. No rash noted.  Psychiatric: Her speech is normal and behavior is normal. Judgment normal. Her mood appears not anxious. Cognition  and memory are normal. She does not exhibit a depressed mood.          Assessment & Plan:   No problem-specific assessment & plan notes found for this encounter.

## 2011-02-21 NOTE — Assessment & Plan Note (Addendum)
Eval thyroid. Possibly will improve with mirena removed. She will follow over the next few months.. Follow u[p then or sooner if symptoms not improving.

## 2011-02-21 NOTE — Patient Instructions (Signed)
Continue work on healthy eating and increase back up exercise. Consider lower carb diet, stop all soda juice, sweet tea. Increase water.  We will call with lab results.

## 2011-02-21 NOTE — Progress Notes (Signed)
  Subjective:    Patient ID: Tiffany Donovan, female    DOB: 1986/04/25, 25 y.o.   MRN: [id number]  HPI    Review of Systems     Objective:   Physical Exam        Assessment & Plan:   Complete pHysical Exam: The patient's preventative maintenance and recommended screening tests for an annual wellness exam were reviewed in full today. Brought up to date unless services declined.  Counselled on the importance of diet, exercise, and its role in overall health and mortality. The patient's FH and SH was reviewed, including their home life, tobacco status, and drug and alcohol status.   IYUD removed with minimal cramping and minimal apin. UTD vaccines PAP yearly Nm breast exam.

## 2011-02-21 NOTE — Assessment & Plan Note (Signed)
Likely due to minrena. Now removed. Follow up in 2 months after removal to see if imprved or worsened symptoms.

## 2011-02-24 ENCOUNTER — Encounter: Payer: Self-pay | Admitting: *Deleted

## 2011-02-27 ENCOUNTER — Other Ambulatory Visit: Payer: Self-pay | Admitting: Family Medicine

## 2011-02-27 DIAGNOSIS — Z0000 Encounter for general adult medical examination without abnormal findings: Secondary | ICD-10-CM

## 2011-02-27 DIAGNOSIS — F320 Major depressive disorder, single episode, mild: Secondary | ICD-10-CM

## 2011-03-29 ENCOUNTER — Ambulatory Visit: Payer: BC Managed Care – PPO | Admitting: Family Medicine

## 2011-03-29 DIAGNOSIS — Z0289 Encounter for other administrative examinations: Secondary | ICD-10-CM

## 2011-03-31 ENCOUNTER — Ambulatory Visit: Payer: BC Managed Care – PPO | Admitting: Family Medicine

## 2011-05-11 ENCOUNTER — Ambulatory Visit (INDEPENDENT_AMBULATORY_CARE_PROVIDER_SITE_OTHER): Payer: BC Managed Care – PPO | Admitting: Family Medicine

## 2011-05-11 ENCOUNTER — Encounter: Payer: Self-pay | Admitting: Family Medicine

## 2011-05-11 DIAGNOSIS — F320 Major depressive disorder, single episode, mild: Secondary | ICD-10-CM

## 2011-05-11 DIAGNOSIS — N938 Other specified abnormal uterine and vaginal bleeding: Secondary | ICD-10-CM | POA: Insufficient documentation

## 2011-05-11 DIAGNOSIS — N925 Other specified irregular menstruation: Secondary | ICD-10-CM

## 2011-05-11 DIAGNOSIS — Z0000 Encounter for general adult medical examination without abnormal findings: Secondary | ICD-10-CM

## 2011-05-11 DIAGNOSIS — N926 Irregular menstruation, unspecified: Secondary | ICD-10-CM

## 2011-05-11 DIAGNOSIS — R21 Rash and other nonspecific skin eruption: Secondary | ICD-10-CM | POA: Insufficient documentation

## 2011-05-11 DIAGNOSIS — N949 Unspecified condition associated with female genital organs and menstrual cycle: Secondary | ICD-10-CM

## 2011-05-11 DIAGNOSIS — F3281 Premenstrual dysphoric disorder: Secondary | ICD-10-CM

## 2011-05-11 DIAGNOSIS — N943 Premenstrual tension syndrome: Secondary | ICD-10-CM

## 2011-05-11 DIAGNOSIS — F329 Major depressive disorder, single episode, unspecified: Secondary | ICD-10-CM

## 2011-05-11 MED ORDER — TRIAMCINOLONE ACETONIDE 0.5 % EX CREA: TOPICAL_CREAM | Freq: Two times a day (BID) | CUTANEOUS | Status: DC

## 2011-05-11 MED ORDER — NORGESTIMATE-ETH ESTRADIOL 0.25-35 MG-MCG PO TABS: 1.0000 | ORAL_TABLET | Freq: Every day | ORAL | Status: DC

## 2011-05-11 NOTE — Patient Instructions (Addendum)
Apply steroid cream to rash twice a day for two weeks.  Change to hypoallergenic detergent, soap, avoid hot showers, do not scratch. Use moisturizing cream like cetaphil daily. Stop at lab for thyroid test before you leave.  Start sprintec daily. Follow up in 2-3 month on mood and menses.

## 2011-05-11 NOTE — Progress Notes (Signed)
  Subjective:    Patient ID: EMRI SAMPLE, female    DOB: 10-23-1985, 25 y.o.   MRN: [id number]  HPI  25 year old female here for follow up mild depression and irregular menses...after removal of IUD in 01/2011.  She reports regular menses once a month, but fairly severe cramping and heavier flow (using 4 tampon on heaviest day, previously was using about 2 on heaviest day).  In early 10/201 1 week before menses she had episode of 2 min of abdominal pain across low abdomen. Associated with dizziness, woozy feeling. Has not happed since.  She has been doing fairly well with mood, but much worse 1 week prior to menses. Much more tearful and sadness, no anxiety, no  Insomnia, no SI, no HI.  In past she was on OCP, patch, did have some issues forgetting to take but no SE.  Has noted rash on abdomen  and neck... In past steroid med had helped.    Review of Systems  Constitutional: Negative for fever and fatigue.  HENT: Negative for ear pain.   Eyes: Negative for pain.  Respiratory: Negative for chest tightness and shortness of breath.   Cardiovascular: Negative for chest pain, palpitations and leg swelling.  Gastrointestinal: Negative for abdominal pain.  Genitourinary: Negative for dysuria.       Objective:   Physical Exam  Constitutional: Vital signs are normal. She appears well-developed and well-nourished. She is cooperative.  Non-toxic appearance. She does not appear ill. No distress.       Morbidly obese  HENT:  Head: Normocephalic.  Right Ear: Hearing, tympanic membrane, external ear and ear canal normal. Tympanic membrane is not erythematous, not retracted and not bulging.  Left Ear: Hearing, tympanic membrane, external ear and ear canal normal. Tympanic membrane is not erythematous, not retracted and not bulging.  Nose: No mucosal edema or rhinorrhea. Right sinus exhibits no maxillary sinus tenderness and no frontal sinus tenderness. Left sinus exhibits no maxillary sinus  tenderness and no frontal sinus tenderness.  Mouth/Throat: Uvula is midline, oropharynx is clear and moist and mucous membranes are normal.  Eyes: Conjunctivae, EOM and lids are normal. Pupils are equal, round, and reactive to light. No foreign bodies found.  Neck: Trachea normal and normal range of motion. Neck supple. Carotid bruit is not present. No mass and no thyromegaly present.  Cardiovascular: Normal rate, regular rhythm, S1 normal, S2 normal, normal heart sounds, intact distal pulses and normal pulses.  Exam reveals no gallop and no friction rub.   No murmur heard. Pulmonary/Chest: Effort normal and breath sounds normal. Not tachypneic. No respiratory distress. She has no decreased breath sounds. She has no wheezes. She has no rhonchi. She has no rales.  Abdominal: Soft. Normal appearance and bowel sounds are normal. There is no tenderness.  Neurological: She is alert.  Skin: Skin is warm, dry and intact. No rash noted.       Scabs and excoriations, red patches, dry flake on abdomen and left neck.  Psychiatric: Her speech is normal and behavior is normal. Judgment and thought content normal. Her mood appears not anxious. Cognition and memory are normal. She does not exhibit a depressed mood.          Assessment & Plan:

## 2011-05-11 NOTE — Assessment & Plan Note (Signed)
Appears exzematous.Marland Kitchen Has had past reaction to metals.  Will treat with moisturization, changing to hypoallergenic items and topical steroid cream BID x 2 weeks. Follow up if not improving.

## 2011-05-11 NOTE — Assessment & Plan Note (Signed)
Will try trial of OCPs for hormonal regulation. If not effective consider SSRI. Pt will call if worening symptoms, otherwise follow up in 2 -3 months after starting OCP.

## 2011-05-11 NOTE — Assessment & Plan Note (Signed)
Resolved

## 2011-05-11 NOTE — Assessment & Plan Note (Addendum)
Off mirena she now has regular menses but heavy bleeding and dysmenorrha. We will check TSH. Trial of OCP for hormomonal regulation with both progesterone and estrogen for uterine stabilization.  Hormonal issues may be related to obesity.. Pt encouraged to exercise and lose weight.

## 2011-05-12 LAB — TSH: TSH: 0.87 u[IU]/mL (ref 0.35–5.50)

## 2011-08-23 ENCOUNTER — Ambulatory Visit (INDEPENDENT_AMBULATORY_CARE_PROVIDER_SITE_OTHER): Payer: BC Managed Care – PPO | Admitting: Family Medicine

## 2011-08-23 ENCOUNTER — Encounter: Payer: Self-pay | Admitting: Family Medicine

## 2011-08-23 DIAGNOSIS — M6740 Ganglion, unspecified site: Secondary | ICD-10-CM

## 2011-08-23 DIAGNOSIS — M67439 Ganglion, unspecified wrist: Secondary | ICD-10-CM

## 2011-08-23 NOTE — Progress Notes (Signed)
  Patient Name: Tiffany Donovan Date of Birth: Feb 12, 1986 Age: 26 y.o. Medical Record Number: [id number] Gender: female Date of Encounter: 08/23/2011  History of Present Illness:  Tiffany Donovan is a 26 y.o. very pleasant female patient who presents with the following:  Left wrist tingling and numbness on the Left wrist  Ferne Coe her wrist pain had a palpable nodular area in the carpal region as well and some median nerve distribution paresthesias. No numbness. No weakness. No trauma or accident. She also is having some fullness and swelling appreciably by her in her right wrist. She has good motion.  Past Medical History, Surgical History, Social History, Family History, Problem List, Medications, and Allergies have been reviewed and updated if relevant.  Review of Systems:  GEN: No fevers, chills. Nontoxic. Primarily MSK c/o today. MSK: Detailed in the HPI GI: tolerating PO intake without difficulty Neuro: detailed above Otherwise the pertinent positives of the ROS are noted above.   Physical Examination: Filed Vitals:   08/23/11 0921  BP: 102/70  Pulse: 62  Temp: 98.3 F (36.8 C)  TempSrc: Oral  Height: 5\' 3"  (1.6 m)  Weight: 206 lb 6.4 oz (93.622 kg)  SpO2: 99%    Body mass index is 36.56 kg/(m^2).   GEN: WDWN, NAD, Non-toxic, Alert & Oriented x 3 HEENT: Atraumatic, Normocephalic.  Ears and Nose: No external deformity. EXTR: No clubbing/cyanosis/edema NEURO: Normal gait.  PSYCH: Normally interactive. Conversant. Not depressed or anxious appearing.  Calm demeanor.   Hand: B Ecchymosis or edema: neg ROM wrist/hand/digits/elbow: full  Carpals, MCP's, digits: NT Distal Ulna and Radius: NT Ecchymosis or edema: neg Cysts/nodules:  Radial aspect of the distal pole or wrist with a palpable cystlike structure, very small Snuffbox tenderness: neg Scaphoid tubercle: NT Hook of Hamate: NT Resisted supination: NT Full composite fist Grip, all digits: 5/5 str No  tenosynovitis Axial load test: neg Phalen's: neg Tinel's: neg Atrophy: neg  Hand sensation: intact   Assessment and Plan: Ganglion cyst. I suspect this is most likely a small ganglion cyst, and may have some median nerve involvement. For now, she is to use cockup wrist splint for the next 3-4 weeks, and also do so on her right wrist at night time he has intermittent swelling. Motrin 800 mg 3 times a day. Follow up in one month if still having difficulty. We discussed other options and potential imaging, MRIs as well as surgical releases nerve entrapment, and she wants to be conservative at this point

## 2011-12-04 ENCOUNTER — Ambulatory Visit: Payer: BC Managed Care – PPO | Admitting: Family Medicine

## 2011-12-04 DIAGNOSIS — Z0289 Encounter for other administrative examinations: Secondary | ICD-10-CM

## 2012-01-26 ENCOUNTER — Ambulatory Visit: Payer: BC Managed Care – PPO | Admitting: Family Medicine

## 2012-03-21 ENCOUNTER — Encounter: Payer: BC Managed Care – PPO | Admitting: Family Medicine

## 2012-04-02 ENCOUNTER — Encounter: Payer: Self-pay | Admitting: Family Medicine

## 2012-04-02 ENCOUNTER — Encounter: Payer: Self-pay | Admitting: *Deleted

## 2012-04-02 ENCOUNTER — Ambulatory Visit (INDEPENDENT_AMBULATORY_CARE_PROVIDER_SITE_OTHER): Payer: BC Managed Care – PPO | Admitting: Family Medicine

## 2012-04-02 VITALS — BP 118/78 | HR 65 | Temp 98.4°F | Wt 209.5 lb

## 2012-04-02 DIAGNOSIS — N644 Mastodynia: Secondary | ICD-10-CM

## 2012-04-02 DIAGNOSIS — Z13220 Encounter for screening for lipoid disorders: Secondary | ICD-10-CM

## 2012-04-02 DIAGNOSIS — R5383 Other fatigue: Secondary | ICD-10-CM

## 2012-04-02 DIAGNOSIS — K219 Gastro-esophageal reflux disease without esophagitis: Secondary | ICD-10-CM | POA: Insufficient documentation

## 2012-04-02 DIAGNOSIS — R5381 Other malaise: Secondary | ICD-10-CM

## 2012-04-02 DIAGNOSIS — F329 Major depressive disorder, single episode, unspecified: Secondary | ICD-10-CM

## 2012-04-02 DIAGNOSIS — F321 Major depressive disorder, single episode, moderate: Secondary | ICD-10-CM | POA: Insufficient documentation

## 2012-04-02 DIAGNOSIS — F320 Major depressive disorder, single episode, mild: Secondary | ICD-10-CM

## 2012-04-02 DIAGNOSIS — Z0000 Encounter for general adult medical examination without abnormal findings: Secondary | ICD-10-CM

## 2012-04-02 LAB — LIPID PANEL
Cholesterol: 142 mg/dL (ref 0–200)
HDL: 61.1 mg/dL
LDL Cholesterol: 74 mg/dL (ref 0–99)
Total CHOL/HDL Ratio: 2
Triglycerides: 34 mg/dL (ref 0.0–149.0)
VLDL: 6.8 mg/dL (ref 0.0–40.0)

## 2012-04-02 LAB — CBC WITH DIFFERENTIAL/PLATELET
Basophils Absolute: 0 10*3/uL (ref 0.0–0.1)
Basophils Relative: 0.4 % (ref 0.0–3.0)
Eosinophils Absolute: 0.1 10*3/uL (ref 0.0–0.7)
Eosinophils Relative: 1.4 % (ref 0.0–5.0)
HCT: 36.8 % (ref 36.0–46.0)
Hemoglobin: 12.3 g/dL (ref 12.0–15.0)
Lymphocytes Relative: 15.6 % (ref 12.0–46.0)
Lymphs Abs: 1 10*3/uL (ref 0.7–4.0)
MCHC: 33.4 g/dL (ref 30.0–36.0)
MCV: 90.1 fl (ref 78.0–100.0)
Monocytes Absolute: 0.3 10*3/uL (ref 0.1–1.0)
Monocytes Relative: 5.5 % (ref 3.0–12.0)
Neutro Abs: 4.7 10*3/uL (ref 1.4–7.7)
Neutrophils Relative %: 77.1 % — ABNORMAL HIGH (ref 43.0–77.0)
Platelets: 218 10*3/uL (ref 150.0–400.0)
RBC: 4.08 Mil/uL (ref 3.87–5.11)
RDW: 12.3 % (ref 11.5–14.6)
WBC: 6.1 10*3/uL (ref 4.5–10.5)

## 2012-04-02 LAB — COMPREHENSIVE METABOLIC PANEL
ALT: 16 U/L (ref 0–35)
AST: 16 U/L (ref 0–37)
Albumin: 3.9 g/dL (ref 3.5–5.2)
Alkaline Phosphatase: 34 U/L — ABNORMAL LOW (ref 39–117)
BUN: 12 mg/dL (ref 6–23)
CO2: 26 mEq/L (ref 19–32)
Calcium: 8.5 mg/dL (ref 8.4–10.5)
Chloride: 108 mEq/L (ref 96–112)
Creatinine, Ser: 0.5 mg/dL (ref 0.4–1.2)
GFR: 154.47 mL/min (ref >60.00)
Glucose, Bld: 77 mg/dL (ref 70–99)
Potassium: 4 mEq/L (ref 3.5–5.1)
Sodium: 139 mEq/L (ref 135–145)
Total Bilirubin: 0.7 mg/dL (ref 0.3–1.2)
Total Protein: 6.7 g/dL (ref 6.0–8.3)

## 2012-04-02 LAB — VITAMIN B12: Vitamin B-12: 710 pg/mL (ref 211–911)

## 2012-04-02 LAB — TSH: TSH: 0.87 u[IU]/mL (ref 0.35–5.50)

## 2012-04-02 NOTE — Assessment & Plan Note (Signed)
Likely due to mood, but will eval with labs.

## 2012-04-02 NOTE — Progress Notes (Signed)
Subjective:    Patient ID: ICESS BERTONI, female    DOB: 1986-05-23, 26 y.o.   MRN: [id number]  HPI  The patient is here for annual wellness exam and preventative care.    Due for pap/breast, fairly heavy... DUB:  Having monthly menses but periods heavy and some cramping.   In last week.. Pain in B breasts upper distal tissue, nipple sensitive. Noted over weekend sharp pain in left breast when it was pressed against leg pulling weeds. No nipple discharge. No fever. No redness, no rash No masses noted. Drinks one soda a day.. Drinks mainly water. Menses started today.  Burning in chest after meals x 1 month. Some increase in stress, no diet changes. In past she was on nexium during pregnancy.  Using tums daily.   She has been having trouble losing weight. She is scared because father was obese and had a lot of health issues. She is tearful about this.  Cannot get under two hundred mark. 5 lb weight gain in last year. Wt Readings from Last 3 Encounters:  04/02/12 209 lb 8 oz (95.029 kg)  08/23/11 206 lb 6.4 oz (93.622 kg)  05/11/11 204 lb 12.8 oz (92.897 kg)  Went to weight loss  Clinic last year...was on phentermine.Marland Kitchen Helped temporarily. Exercising: daily.. 3 times a week, weights at home, elliptical 10 min. Nml sleep at night. She is not comfortable in her own body... She is irritable. She has never been on antidepressant in past.  There is family history of depression.       Review of Systems  Constitutional: Positive for fatigue. Negative for fever.  HENT: Negative for ear pain.   Eyes: Negative for pain.  Respiratory: Negative for shortness of breath and wheezing.   Cardiovascular: Negative for chest pain, palpitations and leg swelling.  Gastrointestinal: Negative for abdominal pain.  Psychiatric/Behavioral: Positive for dysphoric mood. Negative for suicidal ideas. The patient is not nervous/anxious.        Objective:   Physical Exam  Constitutional: Vital  signs are normal. She appears well-developed and well-nourished. She is cooperative.  Non-toxic appearance. She does not appear ill. No distress.  HENT:  Head: Normocephalic.  Right Ear: Hearing, tympanic membrane, external ear and ear canal normal.  Left Ear: Hearing, tympanic membrane, external ear and ear canal normal.  Nose: Nose normal.  Eyes: Conjunctivae, EOM and lids are normal. Pupils are equal, round, and reactive to light. No foreign bodies found.  Neck: Trachea normal and normal range of motion. Neck supple. Carotid bruit is not present. No mass and no thyromegaly present.  Cardiovascular: Normal rate, regular rhythm, S1 normal, S2 normal, normal heart sounds and intact distal pulses.  Exam reveals no gallop.   No murmur heard. Pulmonary/Chest: Effort normal and breath sounds normal. No respiratory distress. She has no wheezes. She has no rhonchi. She has no rales.  Abdominal: Soft. Normal appearance and bowel sounds are normal. She exhibits no distension, no fluid wave, no abdominal bruit and no mass. There is no hepatosplenomegaly. There is no tenderness. There is no rebound, no guarding and no CVA tenderness. No hernia.  Genitourinary: No breast swelling, tenderness, discharge or bleeding. Pelvic exam was performed with patient supine.  Lymphadenopathy:    She has no cervical adenopathy.    She has no axillary adenopathy.  Neurological: She is alert. She has normal strength. No cranial nerve deficit or sensory deficit.  Skin: Skin is warm, dry and intact. No rash noted.  Psychiatric: Her  speech is normal and behavior is normal. Judgment normal. Her mood appears not anxious. Cognition and memory are normal. She does not exhibit a depressed mood.          Assessment & Plan:  The patient's preventative maintenance and recommended screening tests for an annual wellness exam were reviewed in full today. Brought up to date unless services declined.  Counselled on the importance  of diet, exercise, and its role in overall health and mortality. The patient's FH and SH was reviewed, including their home life, tobacco status, and drug and alcohol status.   Vaccines:Uptodate  PAP and DVE: Due.Shanon Rosser perform today given on menses.  Nonsmoker

## 2012-04-02 NOTE — Assessment & Plan Note (Signed)
No abnormalitites on exam... Given menses likely pain related to hormaonal fluctuations. Eval further only if not improving.

## 2012-04-02 NOTE — Patient Instructions (Addendum)
Call if interested in counsling. Follow up in 1 month...Pap. We will call with lab results. Work on gradually increasing exercise... play out plans. Decrease eating out fast food..no more than once a week. Pack lunches. Try prilosec 20 mg daily x 4-6 weeks. If not improving with one tablet daily.. Increase to 2 tabs daily. Stop all caffeine.. If breast pain not improving in 1-2 weeks.. Call for mammogram/US to evaluate further.

## 2012-04-02 NOTE — Assessment & Plan Note (Signed)
Total visit time 30 minutes, > 50% spent counseling and cordinating patients care. Counseled on setting plan for diet changes and weight loss. She will follow up closely to help keep her motivated.

## 2012-04-02 NOTE — Assessment & Plan Note (Signed)
Avoid triggers. Prilosec 1-2 tabs po daily.

## 2012-04-02 NOTE — Assessment & Plan Note (Signed)
Related to weight concerns and poor body image. Will target motivation for weight loss, consider counsleing for help with motivation... If not improving consider venlafaxine.

## 2012-04-19 ENCOUNTER — Ambulatory Visit (INDEPENDENT_AMBULATORY_CARE_PROVIDER_SITE_OTHER): Payer: BC Managed Care – PPO | Admitting: Family Medicine

## 2012-04-19 ENCOUNTER — Encounter: Payer: Self-pay | Admitting: Family Medicine

## 2012-04-19 VITALS — BP 93/73 | HR 80 | Temp 98.2°F | Ht 63.5 in | Wt 210.5 lb

## 2012-04-19 DIAGNOSIS — F321 Major depressive disorder, single episode, moderate: Secondary | ICD-10-CM

## 2012-04-19 MED ORDER — VENLAFAXINE HCL ER 37.5 MG PO CP24: 37.5000 mg | ORAL_CAPSULE | Freq: Every day | ORAL | Status: DC

## 2012-04-19 NOTE — Assessment & Plan Note (Signed)
Refuses counseling. No SI. Start venlafaxine. Close follow up in 1 month.

## 2012-04-19 NOTE — Progress Notes (Signed)
  Subjective:    Patient ID: Tiffany Donovan, female    DOB: 12-26-1985, 26 y.o.   MRN: [id number]  HPI  26 year old female presents 2 weeks EARLY for follow up of obesity and weight loss measures, mild depression, fatigue and GERD.    Today she reports  She is much worse with her mood. He brother comitted suicide in last 5 days. She denies SI, HI.  she cannot think clearly, cotton in head, tearful al the time, sleeping fairly well.  She is not open to a counselor but is interested in medication such as venlafaxine. She has not been able to work on weight loss or other measures.   Wt Readings from Last 3 Encounters:  04/19/12 210 lb 8 oz (95.482 kg)  04/02/12 209 lb 8 oz (95.029 kg)  08/23/11 206 lb 6.4 oz (93.622 kg)   Lab eval of fatigue: Normal kidney function , normal liver function and no diabetes. No anemia, nml B12, nml thyroid.     Review of Systems  Constitutional: Positive for fatigue. Negative for fever.  HENT: Negative for congestion.   Eyes: Negative for pain.  Respiratory: Negative for cough.   Cardiovascular: Negative for chest pain.  Gastrointestinal: Negative for abdominal pain.  Neurological: Positive for headaches.       Objective:   Physical Exam  Constitutional: Vital signs are normal. She appears well-developed and well-nourished. She is cooperative.  Non-toxic appearance. She does not appear ill. No distress.  HENT:  Head: Normocephalic.  Right Ear: Hearing, tympanic membrane, external ear and ear canal normal. Tympanic membrane is not erythematous, not retracted and not bulging.  Left Ear: Hearing, tympanic membrane, external ear and ear canal normal. Tympanic membrane is not erythematous, not retracted and not bulging.  Nose: No mucosal edema or rhinorrhea. Right sinus exhibits no maxillary sinus tenderness and no frontal sinus tenderness. Left sinus exhibits no maxillary sinus tenderness and no frontal sinus tenderness.  Mouth/Throat: Uvula is midline,  oropharynx is clear and moist and mucous membranes are normal.  Eyes: Conjunctivae normal, EOM and lids are normal. Pupils are equal, round, and reactive to light. No foreign bodies found.  Neck: Trachea normal and normal range of motion. Neck supple. Carotid bruit is not present. No mass and no thyromegaly present.  Cardiovascular: Normal rate, regular rhythm, S1 normal, S2 normal, normal heart sounds, intact distal pulses and normal pulses.  Exam reveals no gallop and no friction rub.   No murmur heard. Pulmonary/Chest: Effort normal and breath sounds normal. Not tachypneic. No respiratory distress. She has no decreased breath sounds. She has no wheezes. She has no rhonchi. She has no rales.  Abdominal: Soft. Normal appearance and bowel sounds are normal. There is no tenderness.  Neurological: She is alert.  Skin: Skin is warm, dry and intact. No rash noted.  Psychiatric: Her speech is normal. Judgment and thought content normal. Her mood appears not anxious. She is withdrawn. Cognition and memory are normal. She exhibits a depressed mood. She expresses no homicidal and no suicidal ideation. She expresses no suicidal plans and no homicidal plans.          Assessment & Plan:

## 2012-04-19 NOTE — Patient Instructions (Addendum)
Start venlafaxine 37.5 mg daily, increase after 1 week to 75 mg daily. Call if any severe SE. Given medication 4 weeks to become effective.  Call if headaches are not improving some in next few weeks.

## 2012-05-14 ENCOUNTER — Encounter: Payer: Self-pay | Admitting: Family Medicine

## 2012-05-14 ENCOUNTER — Ambulatory Visit (INDEPENDENT_AMBULATORY_CARE_PROVIDER_SITE_OTHER): Payer: BC Managed Care – PPO | Admitting: Family Medicine

## 2012-05-14 VITALS — BP 104/68 | HR 72 | Temp 98.5°F | Resp 20 | Ht 63.5 in | Wt 210.5 lb

## 2012-05-14 DIAGNOSIS — F321 Major depressive disorder, single episode, moderate: Secondary | ICD-10-CM

## 2012-05-14 MED ORDER — TRIAMCINOLONE ACETONIDE 0.5 % EX CREA: TOPICAL_CREAM | Freq: Two times a day (BID) | CUTANEOUS | Status: AC

## 2012-05-14 NOTE — Progress Notes (Signed)
  Subjective:    Patient ID: Tiffany Donovan, female    DOB: 1986-03-27, 26 y.o.   MRN: [id number]  HPI  Moderate Major Depression, 1 month follow up: On venlafaxine 75 mg daily. She reports she has had 75 % improvement in her mood. She is not as emotionally around menses as well. She fall asleep very easily which she doesn't like, she is having early morning waking. No SE to the medication.  No SI, No HI.  She has not started with weight loss and exercise. Wt Readings from Last 3 Encounters:  05/14/12 210 lb 8 oz (95.482 kg)  04/19/12 210 lb 8 oz (95.482 kg)  04/02/12 209 lb 8 oz (95.029 kg)     Review of Systems  Constitutional: Negative for fever and fatigue.  HENT: Negative for ear pain.   Eyes: Negative for pain.  Respiratory: Negative for chest tightness and shortness of breath.   Cardiovascular: Negative for chest pain, palpitations and leg swelling.  Gastrointestinal: Negative for abdominal pain.  Genitourinary: Negative for dysuria.       Objective:   Physical Exam  Constitutional: Vital signs are normal. She appears well-developed and well-nourished. She is cooperative.  Non-toxic appearance. She does not appear ill. No distress.  HENT:  Head: Normocephalic.  Right Ear: Hearing, tympanic membrane and ear canal normal. Tympanic membrane is not erythematous, not retracted and not bulging.  Left Ear: Hearing, tympanic membrane and ear canal normal. Tympanic membrane is not erythematous, not retracted and not bulging.  Nose: No mucosal edema or rhinorrhea. Right sinus exhibits no maxillary sinus tenderness and no frontal sinus tenderness. Left sinus exhibits no maxillary sinus tenderness and no frontal sinus tenderness.  Mouth/Throat: Uvula is midline, oropharynx is clear and moist and mucous membranes are normal.  Eyes: Conjunctivae normal, EOM and lids are normal. Pupils are equal, round, and reactive to light. No foreign bodies found.  Neck: Trachea normal and normal range  of motion. Neck supple. Carotid bruit is not present. No mass and no thyromegaly present.  Cardiovascular: Normal rate, regular rhythm, S1 normal, S2 normal, normal heart sounds, intact distal pulses and normal pulses.  Exam reveals no gallop and no friction rub.   No murmur heard. Pulmonary/Chest: Effort normal and breath sounds normal. Not tachypneic. No respiratory distress. She has no decreased breath sounds. She has no wheezes. She has no rhonchi. She has no rales.  Abdominal: Normal appearance.  Neurological: She is alert.  Skin: Skin is warm, dry and intact. No rash noted.  Psychiatric: Her speech is normal and behavior is normal. Judgment and thought content normal. Her mood appears not anxious. Cognition and memory are normal. She does not exhibit a depressed mood.          Assessment & Plan:

## 2012-05-14 NOTE — Patient Instructions (Addendum)
When feeling better , work on regular exercise, healthy eating and weight loss.  Continue venlafaxine at current dose.

## 2012-05-14 NOTE — Assessment & Plan Note (Signed)
Improving. Continue venlafaxine and follow up in 3 month.

## 2012-07-13 ENCOUNTER — Other Ambulatory Visit: Payer: Self-pay | Admitting: Family Medicine

## 2012-07-19 ENCOUNTER — Other Ambulatory Visit: Payer: Self-pay | Admitting: Family Medicine

## 2012-07-25 ENCOUNTER — Ambulatory Visit: Payer: BC Managed Care – PPO | Admitting: Family Medicine

## 2012-10-11 ENCOUNTER — Telehealth: Payer: Self-pay | Admitting: *Deleted

## 2012-10-11 NOTE — Telephone Encounter (Signed)
Tiffany Donovan dropped off an initial employment form to be completed by Dr. Ermalene Searing.    Best number for [employer] [phone]

## 2012-10-14 DIAGNOSIS — Z0279 Encounter for issue of other medical certificate: Secondary | ICD-10-CM

## 2015-06-02 ENCOUNTER — Ambulatory Visit (INDEPENDENT_AMBULATORY_CARE_PROVIDER_SITE_OTHER): Payer: BLUE CROSS/BLUE SHIELD | Admitting: Family Medicine

## 2015-06-02 ENCOUNTER — Encounter: Payer: Self-pay | Admitting: Family Medicine

## 2015-06-02 VITALS — BP 135/80 | HR 75 | Temp 98.8°F | Resp 16 | Ht 63.0 in | Wt 201.6 lb

## 2015-06-02 DIAGNOSIS — M545 Low back pain, unspecified: Secondary | ICD-10-CM

## 2015-06-02 DIAGNOSIS — F3342 Major depressive disorder, recurrent, in full remission: Secondary | ICD-10-CM | POA: Diagnosis not present

## 2015-06-02 DIAGNOSIS — F419 Anxiety disorder, unspecified: Secondary | ICD-10-CM

## 2015-06-02 DIAGNOSIS — G8929 Other chronic pain: Secondary | ICD-10-CM

## 2015-06-02 DIAGNOSIS — G43109 Migraine with aura, not intractable, without status migrainosus: Secondary | ICD-10-CM

## 2015-06-02 DIAGNOSIS — F411 Generalized anxiety disorder: Secondary | ICD-10-CM | POA: Insufficient documentation

## 2015-06-02 DIAGNOSIS — F325 Major depressive disorder, single episode, in full remission: Secondary | ICD-10-CM | POA: Insufficient documentation

## 2015-06-02 MED ORDER — DULOXETINE HCL 30 MG PO CPEP: 30.0000 mg | ORAL_CAPSULE | Freq: Every day | ORAL | Status: DC

## 2015-06-02 MED ORDER — CLONAZEPAM 0.5 MG PO TABS: 0.5000 mg | ORAL_TABLET | Freq: Every day | ORAL | Status: DC | PRN

## 2015-06-02 MED ORDER — PAROXETINE HCL 10 MG PO TABS: 10.0000 mg | ORAL_TABLET | Freq: Every day | ORAL | Status: DC

## 2015-06-02 MED ORDER — TOPIRAMATE 50 MG PO TABS: 50.0000 mg | ORAL_TABLET | Freq: Two times a day (BID) | ORAL | Status: DC

## 2015-06-02 NOTE — Progress Notes (Signed)
Name: Tiffany Donovan   MRN: [id number]    DOB: 1986-06-12   Date:06/02/2015       Progress Note  Subjective  Chief Complaint  Chief Complaint  Patient presents with  . Establish Care    Depression      The patient presents with depression.  This is a chronic problem.  The onset quality is gradual. The problem is unchanged.  Associated symptoms include fatigue, helplessness, hopelessness, insomnia, headaches and sad.  Associated symptoms include no suicidal ideas.  Past treatments include SSRIs - Selective serotonin reuptake inhibitors and SNRIs - Serotonin and norepinephrine reuptake inhibitors.  Compliance with treatment is good.  Risk factors include family history of mental illness, major life event and suicide in immediate family (Brother and his wife committed suicide one year apart).   Past medical history includes chronic pain, anxiety and depression.     Pertinent negatives include no suicide attempts. Anxiety Presents for follow-up visit. Symptoms include chest pain, depressed mood, excessive worry, insomnia, nervous/anxious behavior and panic. Patient reports no suicidal ideas. The quality of sleep is poor. Nighttime awakenings: several.   Her past medical history is significant for anxiety/panic attacks and depression. There is no history of suicide attempts. Past treatments include benzodiazephines and SSRIs.  Migraine  This is a chronic problem. The current episode started more than 1 year ago. Associated symptoms include back pain, insomnia, numbness (occasional numbness and tingling in the left foot.) and photophobia. Pertinent negatives include no weakness. The symptoms are aggravated by food. She has tried triptans and antidepressants for the symptoms.  Back Pain This is a chronic problem. The problem has been gradually worsening since onset. The pain is present in the sacro-iliac. The pain does not radiate. The pain is at a severity of 8/10. The symptoms are aggravated by  standing and position. Associated symptoms include chest pain, headaches and numbness (occasional numbness and tingling in the left foot.). Pertinent negatives include no bladder incontinence, bowel incontinence, leg pain or weakness. Risk factors include obesity. She has tried analgesics (Cymbalta) for the symptoms.    Pt. Presents to establish care. Last PCP at Dr. Gala Murdoch office.   Past Medical History  Diagnosis Date  . Migraine headache   . Low back pain   . Premenstrual dysphoric disorder   . Anxiety     History reviewed. No pertinent past surgical history.  Family History  Problem Relation Age of Onset  . Diabetes Father   . Kidney disease Father   . Hypertension Father   . Migraines Sister   . Coronary artery disease Paternal Grandmother   . Coronary artery disease Paternal Grandfather   . Depression Mother     Social History   Social History  . Marital Status: Married    Spouse Name: N/A  . Number of Children: 2  . Years of Education: N/A   Occupational History  . customer service    Social History Main Topics  . Smoking status: Never Smoker   . Smokeless tobacco: Not on file  . Alcohol Use: 0.0 oz/week    0 Standard drinks or equivalent per week     Comment: occasionally  . Drug Use: No  . Sexual Activity:    Partners: Female   Other Topics Concern  . Not on file   Social History Narrative   Regular exercise- no      Diet: fruits and veggies..but, just eats 1 meal a day    Current outpatient prescriptions:  .  clonazePAM (KLONOPIN) 0.5 MG tablet, Take 0.5 mg by mouth daily as needed for anxiety., Disp: , Rfl:  .  DULoxetine (CYMBALTA) 30 MG capsule, , Disp: , Rfl:  .  PARoxetine (PAXIL) 10 MG tablet, , Disp: , Rfl:  .  rizatriptan (MAXALT) 10 MG tablet, , Disp: , Rfl:  .  topiramate (TOPAMAX) 50 MG tablet, Take 50 mg by mouth every 12 (twelve) hours., Disp: , Rfl: 2  No Known Allergies   Review of Systems  Constitutional: Positive for  fatigue.  Eyes: Positive for photophobia.  Cardiovascular: Positive for chest pain.  Gastrointestinal: Negative for bowel incontinence.  Genitourinary: Negative for bladder incontinence.  Musculoskeletal: Positive for back pain.  Neurological: Positive for numbness (occasional numbness and tingling in the left foot.) and headaches. Negative for weakness.  Psychiatric/Behavioral: Positive for depression. Negative for suicidal ideas. The patient is nervous/anxious and has insomnia.    Objective  Filed Vitals:   06/02/15 0905  BP: 135/80  Pulse: 75  Temp: 98.8 F (37.1 C)  TempSrc: Oral  Resp: 16  Height: 5\' 3"  (1.6 m)  Weight: 201 lb 9.6 oz (91.445 kg)  SpO2: 97%    Physical Exam  Constitutional: She is oriented to person, place, and time and well-developed, well-nourished, and in no distress.  Cardiovascular: Normal rate, regular rhythm and normal heart sounds.   Pulmonary/Chest: Effort normal and breath sounds normal.  Abdominal: Soft. Bowel sounds are normal.  Musculoskeletal:       Lumbar back: She exhibits normal range of motion, no pain and no spasm.       Back:  Neurological: She is alert and oriented to person, place, and time.  Psychiatric: Memory, affect and judgment normal.  Nursing note and vitals reviewed.   Assessment & Plan  1. Recurrent major depressive disorder, in full remission ([place])  - PARoxetine (PAXIL) 10 MG tablet; Take 1 tablet (10 mg total) by mouth daily.  Dispense: 30 tablet; Refill: 0  2. Migraine with aura and without status migrainosus, not intractable  - topiramate (TOPAMAX) 50 MG tablet; Take 1 tablet (50 mg total) by mouth every 12 (twelve) hours.  Dispense: 60 tablet; Refill: 0  3. Chronic left-sided low back pain without sciatica  - DULoxetine (CYMBALTA) 30 MG capsule; Take 1 capsule (30 mg total) by mouth daily.  Dispense: 30 capsule; Refill: 0 - [facility] Lumbar Spine Complete; Future  4. Anxiety Patient is aware of the dependence  potential, side effects, and drug interactions of clonazepam. We'll request records from previous PCP. - clonazePAM (KLONOPIN) 0.5 MG tablet; Take 1 tablet (0.5 mg total) by mouth daily as needed for anxiety.  Dispense: 30 tablet; Refill: 0  Educated that drugs like Topamax and clonazepam are not safe in pregnancy and lactation. Patient acknowledged understanding. Ritta Hammes [facility] 06/02/2015 9:31 AM

## 2015-06-27 ENCOUNTER — Other Ambulatory Visit: Payer: Self-pay | Admitting: Family Medicine

## 2015-07-02 ENCOUNTER — Ambulatory Visit: Payer: BLUE CROSS/BLUE SHIELD | Admitting: Family Medicine

## 2015-10-27 ENCOUNTER — Encounter: Payer: Self-pay | Admitting: Family Medicine

## 2015-10-27 ENCOUNTER — Ambulatory Visit (INDEPENDENT_AMBULATORY_CARE_PROVIDER_SITE_OTHER): Payer: 59 | Admitting: Family Medicine

## 2015-10-27 VITALS — BP 132/70 | HR 82 | Temp 98.8°F | Resp 17 | Ht 63.0 in | Wt 212.6 lb

## 2015-10-27 DIAGNOSIS — F331 Major depressive disorder, recurrent, moderate: Secondary | ICD-10-CM | POA: Diagnosis not present

## 2015-10-27 DIAGNOSIS — G8929 Other chronic pain: Secondary | ICD-10-CM | POA: Diagnosis not present

## 2015-10-27 DIAGNOSIS — G43109 Migraine with aura, not intractable, without status migrainosus: Secondary | ICD-10-CM | POA: Diagnosis not present

## 2015-10-27 DIAGNOSIS — F419 Anxiety disorder, unspecified: Secondary | ICD-10-CM

## 2015-10-27 DIAGNOSIS — M545 Low back pain, unspecified: Secondary | ICD-10-CM

## 2015-10-27 MED ORDER — TOPIRAMATE 50 MG PO TABS: 50.0000 mg | ORAL_TABLET | Freq: Two times a day (BID) | ORAL | Status: DC

## 2015-10-27 MED ORDER — CLONAZEPAM 0.5 MG PO TABS: 0.5000 mg | ORAL_TABLET | Freq: Every day | ORAL | Status: DC | PRN

## 2015-10-27 MED ORDER — DULOXETINE HCL 30 MG PO CPEP: 30.0000 mg | ORAL_CAPSULE | Freq: Every day | ORAL | Status: DC

## 2015-10-27 MED ORDER — PAROXETINE HCL 10 MG PO TABS: 10.0000 mg | ORAL_TABLET | Freq: Every day | ORAL | Status: DC

## 2015-10-27 NOTE — Progress Notes (Signed)
Name: Tiffany Donovan   MRN: [id number]    DOB: 1986/03/29   Date:10/27/2015       Progress Note  Subjective  Chief Complaint  Chief Complaint  Patient presents with  . Medication Refill    clonzepam 0.5 mg / topamax 50 mg / paxil 10 mg / cymbalta 30 mg     HPI  Anxiety: Pt. Presents for refill of Clonazepam 0.5 mg daily as needed. Has been out of medication, so anxiety is worse. Main symptoms include palpitations, chest pressure and feeling uncomfortable'. Clonazepam helps relieve these symptoms. No side effects reported.   Depression: Pt. Presents for medication follow up, takes Paxil 20 mg daily for Depression. Symptoms include feeling sad, no energy, no interest in usual activities. Paxil does help relieve these symptoms.  Back Pain: Pt. Has chronic low back pain, takes Cymbalta 30 mg daily, pain is not as severe today, has not gotten the  X ray of lumbar spine ordered at last visit.  Migraine: Pt. Presents for follow-up and medication refill for migraine headaches. She was on Topamax 50 mg twice daily, ran out and could not afford to come back for a refill earlier. She has been taking Excedrin and Ibuprofen for relief.   Past Medical History  Diagnosis Date  . Migraine headache   . Low back pain   . Premenstrual dysphoric disorder   . Anxiety     History reviewed. No pertinent past surgical history.  Family History  Problem Relation Age of Onset  . Diabetes Father   . Kidney disease Father   . Hypertension Father   . Migraines Sister   . Coronary artery disease Paternal Grandmother   . Coronary artery disease Paternal Grandfather   . Depression Mother     Social History   Social History  . Marital Status: Married    Spouse Name: N/A  . Number of Children: 2  . Years of Education: N/A   Occupational History  . customer service    Social History Main Topics  . Smoking status: Never Smoker   . Smokeless tobacco: Not on file  . Alcohol Use: 0.0 oz/week    0  Standard drinks or equivalent per week     Comment: occasionally  . Drug Use: No  . Sexual Activity:    Partners: Female   Other Topics Concern  . Not on file   Social History Narrative   Regular exercise- no      Diet: fruits and veggies..but, just eats 1 meal a day     Current outpatient prescriptions:  .  clonazePAM (KLONOPIN) 0.5 MG tablet, Take 1 tablet (0.5 mg total) by mouth daily as needed for anxiety., Disp: 30 tablet, Rfl: 0 .  DULoxetine (CYMBALTA) 30 MG capsule, TAKE 1 CAPSULE (30 MG TOTAL) BY MOUTH DAILY., Disp: 30 capsule, Rfl: 2 .  ibuprofen (ADVIL,MOTRIN) 800 MG tablet, , Disp: , Rfl:  .  PARoxetine (PAXIL) 10 MG tablet, TAKE 1 TABLET (10 MG TOTAL) BY MOUTH DAILY., Disp: 30 tablet, Rfl: 2 .  rizatriptan (MAXALT) 10 MG tablet, , Disp: , Rfl:  .  topiramate (TOPAMAX) 50 MG tablet, TAKE 1 TABLET (50 MG TOTAL) BY MOUTH EVERY 12 (TWELVE) HOURS., Disp: 60 tablet, Rfl: 2  No Known Allergies   Review of Systems  Respiratory: Negative for shortness of breath.   Cardiovascular: Negative for chest pain.  Musculoskeletal: Positive for back pain.  Neurological: Positive for headaches.  Psychiatric/Behavioral: Positive for depression. The patient is  nervous/anxious.     Objective  Filed Vitals:   10/27/15 0810  BP: 132/70  Pulse: 82  Temp: 98.8 F (37.1 C)  TempSrc: Oral  Resp: 17  Height: 5\' 3"  (1.6 m)  Weight: 212 lb 9.6 oz (96.435 kg)  SpO2: 97%    Physical Exam  Constitutional: She is oriented to person, place, and time and well-developed, well-nourished, and in no distress.  Cardiovascular: Normal rate, regular rhythm and normal heart sounds.   Pulmonary/Chest: Effort normal and breath sounds normal.  Abdominal: Soft. Bowel sounds are normal.  Musculoskeletal:       Lumbar back: She exhibits normal range of motion, no pain and no spasm.       Back:  Neurological: She is alert and oriented to person, place, and time.  Psychiatric: Memory, affect and  judgment normal.  Nursing note and vitals reviewed.    Assessment & Plan  1. Migraine with aura and without status migrainosus, not intractable Controlled on Topamax taken twice daily. - topiramate (TOPAMAX) 50 MG tablet; Take 1 tablet (50 mg total) by mouth 2 (two) times daily.  Dispense: 60 tablet; Refill: 2  2. Anxiety Symptoms of anxiety are stable on clonazepam taken daily as needed. - clonazePAM (KLONOPIN) 0.5 MG tablet; Take 1 tablet (0.5 mg total) by mouth daily as needed for anxiety.  Dispense: 30 tablet; Refill: 0  3. Chronic midline low back pain without sciatica  - DULoxetine (CYMBALTA) 30 MG capsule; Take 1 capsule (30 mg total) by mouth daily.  Dispense: 30 capsule; Refill: 2  4. Moderate episode of recurrent major depressive disorder (HCC) Stable, refill provided. - PARoxetine (PAXIL) 10 MG tablet; Take 1 tablet (10 mg total) by mouth daily.  Dispense: 30 tablet; Refill: 2   Ponce Skillman [facility] 10/27/2015 8:20 AM

## 2015-10-28 ENCOUNTER — Ambulatory Visit: Payer: BLUE CROSS/BLUE SHIELD | Admitting: Family Medicine

## 2015-11-26 ENCOUNTER — Ambulatory Visit: Payer: 59 | Admitting: Family Medicine

## 2015-12-30 ENCOUNTER — Ambulatory Visit: Payer: 59 | Admitting: Family Medicine

## 2016-01-11 ENCOUNTER — Telehealth: Payer: Self-pay | Admitting: Family Medicine

## 2016-01-11 NOTE — Telephone Encounter (Signed)
PT SAID THAT SHE IS NEEDING ALL THREE OF HER MEDICATIONS REFILLED TO [facility] AND THEY HAVE TO BE FOR 90 DAY SUPPLY. SHE SAID THAT THEY HAVE SENT THE REQUEST WITH NO RESPONSE. SHE HAS AN APPT THIS Friday June 23RD.

## 2016-01-11 NOTE — Telephone Encounter (Signed)
Thank you. We can evaluate and refill all 3 medications during her office visit appointment on Friday, June 23

## 2016-01-14 ENCOUNTER — Ambulatory Visit: Payer: 59 | Admitting: Family Medicine

## 2016-02-14 ENCOUNTER — Encounter: Payer: Self-pay | Admitting: Family Medicine

## 2016-02-14 ENCOUNTER — Ambulatory Visit (INDEPENDENT_AMBULATORY_CARE_PROVIDER_SITE_OTHER): Payer: 59 | Admitting: Family Medicine

## 2016-02-14 VITALS — BP 124/76 | HR 94 | Temp 98.9°F | Resp 16 | Ht 63.0 in | Wt 217.9 lb

## 2016-02-14 DIAGNOSIS — M545 Low back pain, unspecified: Secondary | ICD-10-CM | POA: Insufficient documentation

## 2016-02-14 DIAGNOSIS — G43109 Migraine with aura, not intractable, without status migrainosus: Secondary | ICD-10-CM | POA: Diagnosis not present

## 2016-02-14 DIAGNOSIS — G8929 Other chronic pain: Secondary | ICD-10-CM | POA: Diagnosis not present

## 2016-02-14 DIAGNOSIS — F419 Anxiety disorder, unspecified: Secondary | ICD-10-CM

## 2016-02-14 DIAGNOSIS — F3342 Major depressive disorder, recurrent, in full remission: Secondary | ICD-10-CM | POA: Diagnosis not present

## 2016-02-14 MED ORDER — CLONAZEPAM 0.5 MG PO TABS: 0.5000 mg | ORAL_TABLET | Freq: Every day | ORAL | 2 refills | Status: DC | PRN

## 2016-02-14 MED ORDER — PAROXETINE HCL 10 MG PO TABS: 10.0000 mg | ORAL_TABLET | Freq: Every day | ORAL | 0 refills | Status: DC

## 2016-02-14 MED ORDER — DULOXETINE HCL 30 MG PO CPEP: 30.0000 mg | ORAL_CAPSULE | Freq: Every day | ORAL | 0 refills | Status: DC

## 2016-02-14 MED ORDER — TOPIRAMATE 50 MG PO TABS: 50.0000 mg | ORAL_TABLET | Freq: Two times a day (BID) | ORAL | 0 refills | Status: DC

## 2016-02-14 NOTE — Progress Notes (Signed)
Name: Tiffany Donovan   MRN: [id number]    DOB: October 14, 1985   Date:02/14/2016       Progress Note  Subjective  Chief Complaint  Chief Complaint  Patient presents with  . Headache    medication refills  . Depression  . Anxiety    Anxiety: Mainly anxious, overwhelmed, stressed out. She takes Clonazepam 0.5 mg daily as needed, it works well. No side effects reported.  Migraine Headaches: Gets migraine headaches once very 10 days, controlled while she is on Topamax. SHe does not take Maxalt regularly.  Depression: Symptoms include sad, feeling down or depressed, varies according to the situation or what is going on during the day. Takes Paxil 10 mg daily, initially started due to PMDD.  Low Back Pain: History of low back pain in the sacral area, feels like it started after she fell on it a few times, pain is present intermittently, worse with sitting down in one position. She takes Cymbalta 30 mg daily.      Past Medical History:  Diagnosis Date  . Anxiety   . Low back pain   . Migraine headache   . Premenstrual dysphoric disorder     No past surgical history on file.  Family History  Problem Relation Age of Onset  . Diabetes Father   . Kidney disease Father   . Hypertension Father   . Migraines Sister   . Coronary artery disease Paternal Grandmother   . Coronary artery disease Paternal Grandfather   . Depression Mother     Social History   Social History  . Marital status: Married    Spouse name: N/A  . Number of children: 2  . Years of education: N/A   Occupational History  . customer service [employer]   Social History Main Topics  . Smoking status: Never Smoker  . Smokeless tobacco: Not on file  . Alcohol use 0.0 oz/week     Comment: occasionally  . Drug use: No  . Sexual activity: Yes    Partners: Female   Other Topics Concern  . Not on file   Social History Narrative   Regular exercise- no      Diet: fruits and veggies..but, just eats 1 meal a  day     Current Outpatient Prescriptions:  .  clonazePAM (KLONOPIN) 0.5 MG tablet, Take 1 tablet (0.5 mg total) by mouth daily as needed for anxiety., Disp: 30 tablet, Rfl: 0 .  DULoxetine (CYMBALTA) 30 MG capsule, Take 1 capsule (30 mg total) by mouth daily., Disp: 30 capsule, Rfl: 2 .  ibuprofen (ADVIL,MOTRIN) 800 MG tablet, , Disp: , Rfl:  .  PARoxetine (PAXIL) 10 MG tablet, Take 1 tablet (10 mg total) by mouth daily., Disp: 30 tablet, Rfl: 2 .  rizatriptan (MAXALT) 10 MG tablet, , Disp: , Rfl:  .  topiramate (TOPAMAX) 50 MG tablet, Take 1 tablet (50 mg total) by mouth 2 (two) times daily., Disp: 60 tablet, Rfl: 2  No Known Allergies   Review of Systems  Musculoskeletal: Positive for back pain.  Neurological: Positive for headaches.  Psychiatric/Behavioral: Positive for depression. Negative for suicidal ideas. The patient is nervous/anxious.     Objective  Vitals:   02/14/16 0834  BP: 124/76  Pulse: 94  Resp: 16  Temp: 98.9 F (37.2 C)  TempSrc: Oral  SpO2: 98%  Weight: 217 lb 14.4 oz (98.8 kg)  Height: 5\' 3"  (1.6 m)    Physical Exam  Constitutional: She is oriented to person,  place, and time and well-developed, well-nourished, and in no distress.  HENT:  Head: Normocephalic and atraumatic.  Cardiovascular: Normal rate and regular rhythm.   No murmur heard. Pulmonary/Chest: Effort normal and breath sounds normal.  Musculoskeletal:       Lumbar back: She exhibits tenderness (tendenress to palpation at the lower lumb0-sacral area in the midline, ).  Neurological: She is alert and oriented to person, place, and time.  Psychiatric: Mood, memory, affect and judgment normal.  Nursing note and vitals reviewed.   Assessment & Plan  1. Recurrent major depressive disorder, in full remission ([place])  - PARoxetine (PAXIL) 10 MG tablet; Take 1 tablet (10 mg total) by mouth daily.  Dispense: 90 tablet; Refill: 0  2. Anxiety  - clonazePAM (KLONOPIN) 0.5 MG tablet; Take 1  tablet (0.5 mg total) by mouth daily as needed for anxiety.  Dispense: 30 tablet; Refill: 2  3. Migraine with aura and without status migrainosus, not intractable  - topiramate (TOPAMAX) 50 MG tablet; Take 1 tablet (50 mg total) by mouth 2 (two) times daily.  Dispense: 180 tablet; Refill: 0  4. Chronic midline low back pain without sciatica  - DULoxetine (CYMBALTA) 30 MG capsule; Take 1 capsule (30 mg total) by mouth daily.  Dispense: 90 capsule; Refill: 0  Tiffany Donovan [facility] 02/14/2016 8:38 AM

## 2016-03-29 ENCOUNTER — Telehealth: Payer: Self-pay | Admitting: Family Medicine

## 2016-03-29 DIAGNOSIS — F419 Anxiety disorder, unspecified: Secondary | ICD-10-CM

## 2016-03-29 MED ORDER — CLONAZEPAM 0.5 MG PO TABS: 0.5000 mg | ORAL_TABLET | Freq: Every day | ORAL | 0 refills | Status: DC | PRN

## 2016-03-29 NOTE — Telephone Encounter (Signed)
Script printed awaiting signature form Manuella Ghazi.

## 2016-03-30 NOTE — Telephone Encounter (Signed)
LEFT MESSAGE ON PATIENT PHONE RX READY FOR PICK UP

## 2016-03-30 NOTE — Telephone Encounter (Signed)
SPOKE WITH PT TO TELL HER RX IS READY FOR PICK UP

## 2016-04-03 ENCOUNTER — Other Ambulatory Visit: Payer: Self-pay | Admitting: Family Medicine

## 2016-04-03 DIAGNOSIS — F3342 Major depressive disorder, recurrent, in full remission: Secondary | ICD-10-CM

## 2016-04-03 DIAGNOSIS — M545 Low back pain, unspecified: Secondary | ICD-10-CM

## 2016-04-03 DIAGNOSIS — G43109 Migraine with aura, not intractable, without status migrainosus: Secondary | ICD-10-CM

## 2016-04-03 DIAGNOSIS — G8929 Other chronic pain: Secondary | ICD-10-CM

## 2016-04-13 ENCOUNTER — Telehealth: Payer: Self-pay | Admitting: Family Medicine

## 2016-04-13 NOTE — Telephone Encounter (Signed)
Has been noted and all future refills will go to [facility] per patient

## 2016-04-21 ENCOUNTER — Telehealth: Payer: Self-pay | Admitting: *Deleted

## 2016-04-21 ENCOUNTER — Encounter: Payer: Self-pay | Admitting: Family Medicine

## 2016-04-21 ENCOUNTER — Ambulatory Visit (INDEPENDENT_AMBULATORY_CARE_PROVIDER_SITE_OTHER): Payer: 59 | Admitting: Family Medicine

## 2016-04-21 ENCOUNTER — Telehealth: Payer: Self-pay | Admitting: Family Medicine

## 2016-04-21 ENCOUNTER — Other Ambulatory Visit: Payer: 59

## 2016-04-21 VITALS — BP 90/60 | HR 94 | Temp 98.8°F | Ht 62.5 in | Wt 217.5 lb

## 2016-04-21 DIAGNOSIS — N898 Other specified noninflammatory disorders of vagina: Secondary | ICD-10-CM

## 2016-04-21 DIAGNOSIS — F321 Major depressive disorder, single episode, moderate: Secondary | ICD-10-CM

## 2016-04-21 DIAGNOSIS — N765 Ulceration of vagina: Secondary | ICD-10-CM

## 2016-04-21 DIAGNOSIS — J301 Allergic rhinitis due to pollen: Secondary | ICD-10-CM

## 2016-04-21 DIAGNOSIS — M545 Low back pain, unspecified: Secondary | ICD-10-CM

## 2016-04-21 DIAGNOSIS — D729 Disorder of white blood cells, unspecified: Secondary | ICD-10-CM

## 2016-04-21 DIAGNOSIS — G43009 Migraine without aura, not intractable, without status migrainosus: Secondary | ICD-10-CM

## 2016-04-21 DIAGNOSIS — G8929 Other chronic pain: Secondary | ICD-10-CM

## 2016-04-21 LAB — CBC WITH DIFFERENTIAL/PLATELET
Basophils Absolute: 0.1 10*3/uL (ref 0.0–0.1)
Basophils Relative: 0.3 % (ref 0.0–3.0)
Eosinophils Absolute: 0 10*3/uL (ref 0.0–0.7)
Eosinophils Relative: 0 % (ref 0.0–5.0)
HCT: 39.2 % (ref 36.0–46.0)
Hemoglobin: 13.4 g/dL (ref 12.0–15.0)
Lymphocytes Relative: 4.8 % — ABNORMAL LOW (ref 12.0–46.0)
Lymphs Abs: 0.9 10*3/uL (ref 0.7–4.0)
MCHC: 34.1 g/dL (ref 30.0–36.0)
MCV: 87.3 fl (ref 78.0–100.0)
Monocytes Absolute: 1.1 10*3/uL — ABNORMAL HIGH (ref 0.1–1.0)
Monocytes Relative: 5.6 % (ref 3.0–12.0)
Neutro Abs: 16.9 10*3/uL — ABNORMAL HIGH (ref 1.4–7.7)
Neutrophils Relative %: 89.3 % — ABNORMAL HIGH (ref 43.0–77.0)
Platelets: 295 10*3/uL (ref 150.0–400.0)
RBC: 4.49 Mil/uL (ref 3.87–5.11)
RDW: 12.4 % (ref 11.5–15.5)
WBC: 18.9 10*3/uL (ref 4.0–10.5)

## 2016-04-21 MED ORDER — ONDANSETRON HCL 4 MG PO TABS: 4.0000 mg | ORAL_TABLET | Freq: Three times a day (TID) | ORAL | 0 refills | Status: DC | PRN

## 2016-04-21 MED ORDER — DOXYCYCLINE HYCLATE 100 MG PO TABS: 100.0000 mg | ORAL_TABLET | Freq: Two times a day (BID) | ORAL | 0 refills | Status: DC

## 2016-04-21 MED ORDER — VALACYCLOVIR HCL 1 G PO TABS: 1000.0000 mg | ORAL_TABLET | Freq: Two times a day (BID) | ORAL | 0 refills | Status: DC

## 2016-04-21 NOTE — Patient Instructions (Addendum)
Start antiviral x 10 days.  Apply barrier cream to help with comfort.  Use ibuprofen for pain.  We will call with lab results/culture.

## 2016-04-21 NOTE — Telephone Encounter (Signed)
Noted and spoke with pt. 

## 2016-04-21 NOTE — Assessment & Plan Note (Signed)
Stable control on paxil and cymbalta. Using clonazapam as needed 3 times a week.

## 2016-04-21 NOTE — Telephone Encounter (Signed)
Critical labs- WBC 18.9.

## 2016-04-21 NOTE — Assessment & Plan Note (Signed)
Cymbalta controls this very well.Uses ibuprofen prn.

## 2016-04-21 NOTE — Assessment & Plan Note (Signed)
Stable control on topiramate. USes excedrine prn.

## 2016-04-21 NOTE — Progress Notes (Signed)
Subjective:    Patient ID: Tiffany Donovan, female    DOB: 05-27-1986, 30 y.o.   MRN: [id number]  HPI   30 year old female presents to re-establish care.  PCP: [facility], Dr. Manuella Ghazi. Last CPX in 2012   She reports in last week in last 4 days.. Flu like symptoms. Myalgia, fever ( 102.1 this AM), chills. No ST.  Has taken tylenol.  No URI symptoms, no cough. No ear pain.  No skin rash.  She has thrown up 2 times this AM.  No diarrhea, last night left abdomen last night.  No dysuria.  She has also noted a genital ulcer at same time about 4 days ago. Occurred after scratching vaginal area on Monday. Was very irritate.. Noted ulcer with mirror.  Lesion has increased in size, bigger., ? Swollen lymph nodes, improved now.  No known expsosure to oral herpes, does have girlfriend, no other contracts.  No possible pregnancy given homosexual.  She also noted soreness under bilateral armpits   Common migraine: Well controlled on topiramate. Occurring 2 times a month, tolerable for pt.  Allergies controlled on zyrtec.   Depression, anxiety stable on current meds. Using clonazepam 3 times a week. Dr. Manuella Ghazi prescribed.    Review of Systems  Constitutional: Negative for fatigue and fever.  HENT: Negative for ear pain.   Eyes: Negative for pain.  Respiratory: Negative for chest tightness and shortness of breath.   Cardiovascular: Negative for chest pain, palpitations and leg swelling.  Gastrointestinal: Negative for abdominal pain.  Genitourinary: Negative for dysuria.       Objective:   Physical Exam  Constitutional: Vital signs are normal. She appears well-developed and well-nourished. She is cooperative.  Non-toxic appearance. She does not appear ill. No distress.  HENT:  Head: Normocephalic.  Right Ear: Hearing, tympanic membrane, external ear and ear canal normal. Tympanic membrane is not erythematous, not retracted and not bulging.  Left Ear: Hearing, tympanic membrane,  external ear and ear canal normal. Tympanic membrane is not erythematous, not retracted and not bulging.  Nose: No mucosal edema or rhinorrhea. Right sinus exhibits no maxillary sinus tenderness and no frontal sinus tenderness. Left sinus exhibits no maxillary sinus tenderness and no frontal sinus tenderness.  Mouth/Throat: Uvula is midline, oropharynx is clear and moist and mucous membranes are normal.  Eyes: Conjunctivae, EOM and lids are normal. Pupils are equal, round, and reactive to light. Lids are everted and swept, no foreign bodies found.  Neck: Trachea normal and normal range of motion. Neck supple. Carotid bruit is not present. No thyroid mass and no thyromegaly present.  Cardiovascular: Normal rate, regular rhythm, S1 normal, S2 normal, normal heart sounds, intact distal pulses and normal pulses.  Exam reveals no gallop and no friction rub.   No murmur heard. Pulmonary/Chest: Effort normal and breath sounds normal. No tachypnea. No respiratory distress. She has no decreased breath sounds. She has no wheezes. She has no rhonchi. She has no rales.  Abdominal: Soft. Normal appearance and bowel sounds are normal. There is no tenderness.  Genitourinary:    There is tenderness and lesion on the left labia.  Genitourinary Comments: Large are of ulcerations, multiple, surrounding erythema and pain  Neurological: She is alert.  Skin: Skin is warm, dry and intact. No rash noted.  Psychiatric: Her speech is normal and behavior is normal. Judgment and thought content normal. Her mood appears not anxious. Cognition and memory are normal. She does not exhibit a depressed mood.  Assessment & Plan:

## 2016-04-21 NOTE — Assessment & Plan Note (Signed)
Stable control on zyrtec

## 2016-04-21 NOTE — Telephone Encounter (Signed)
Chazlyn notified as instructed by telephone.  Follow up appointment scheduled for 04/25/2016 at 1:45 pm with Dr. Diona Browner.

## 2016-04-21 NOTE — Telephone Encounter (Signed)
Notify pt that cbc showed elevated white blood cells.. Looks like bacterial infection possible.  Continue antiviral but also start antibitoics..  Go to ER if not able to keep down liquids/medication or if fever on antibiotics. I also sent in med for nausea if needed. Follow up next Tuesday.

## 2016-04-21 NOTE — Progress Notes (Signed)
Pre visit review using our clinic review tool, if applicable. No additional management support is needed unless otherwise documented below in the visit note. 

## 2016-04-24 LAB — HERPES SIMPLEX VIRUS CULTURE: Organism ID, Bacteria: NOT DETECTED

## 2016-04-24 LAB — PATHOLOGIST SMEAR REVIEW

## 2016-04-24 LAB — WOUND CULTURE: Organism ID, Bacteria: NORMAL

## 2016-04-25 ENCOUNTER — Ambulatory Visit: Payer: 59 | Admitting: Family Medicine

## 2016-04-25 ENCOUNTER — Telehealth: Payer: Self-pay | Admitting: Family Medicine

## 2016-04-25 NOTE — Telephone Encounter (Signed)
Left message for Djeneba to return my call.

## 2016-04-25 NOTE — Telephone Encounter (Signed)
Call pt tpo get an update on how she is feeling on antibiotics given she was not able to be seen today.

## 2016-04-26 NOTE — Telephone Encounter (Signed)
Left message for Tiffany Donovan to return my call.

## 2016-04-27 NOTE — Telephone Encounter (Signed)
Tiffany Donovan has not returned my call x 2 tries.  She is scheduled to follow up with Dr. Diona Browner Friday 04/28/2016 at 12:00pm.

## 2016-04-28 ENCOUNTER — Ambulatory Visit: Payer: 59 | Admitting: Family Medicine

## 2016-04-28 DIAGNOSIS — Z0289 Encounter for other administrative examinations: Secondary | ICD-10-CM

## 2016-05-17 ENCOUNTER — Ambulatory Visit: Payer: 59 | Admitting: Family Medicine

## 2016-05-19 NOTE — Assessment & Plan Note (Addendum)
Eval for herpes given appearance.. Send viral culture. Check cbc.  Treat empirically: Start antiviral x 10 days.  Apply barrier cream to help with comfort.  Use ibuprofen for pain.

## 2016-06-09 ENCOUNTER — Ambulatory Visit (INDEPENDENT_AMBULATORY_CARE_PROVIDER_SITE_OTHER)
Admission: RE | Admit: 2016-06-09 | Discharge: 2016-06-09 | Disposition: A | Discharge status: Home / Self Care | Payer: 59 | Source: Ambulatory Visit | Attending: Family Medicine | Admitting: Family Medicine

## 2016-06-09 ENCOUNTER — Ambulatory Visit (INDEPENDENT_AMBULATORY_CARE_PROVIDER_SITE_OTHER): Payer: 59 | Admitting: Family Medicine

## 2016-06-09 ENCOUNTER — Encounter: Payer: Self-pay | Admitting: Family Medicine

## 2016-06-09 VITALS — BP 100/64 | HR 62 | Temp 98.3°F | Ht 62.5 in | Wt 223.0 lb

## 2016-06-09 DIAGNOSIS — M545 Low back pain, unspecified: Secondary | ICD-10-CM

## 2016-06-09 DIAGNOSIS — F419 Anxiety disorder, unspecified: Secondary | ICD-10-CM | POA: Diagnosis not present

## 2016-06-09 DIAGNOSIS — F3342 Major depressive disorder, recurrent, in full remission: Secondary | ICD-10-CM

## 2016-06-09 DIAGNOSIS — G8929 Other chronic pain: Secondary | ICD-10-CM | POA: Diagnosis not present

## 2016-06-09 DIAGNOSIS — M533 Sacrococcygeal disorders, not elsewhere classified: Secondary | ICD-10-CM

## 2016-06-09 DIAGNOSIS — F321 Major depressive disorder, single episode, moderate: Secondary | ICD-10-CM

## 2016-06-09 DIAGNOSIS — F411 Generalized anxiety disorder: Secondary | ICD-10-CM

## 2016-06-09 DIAGNOSIS — G43009 Migraine without aura, not intractable, without status migrainosus: Secondary | ICD-10-CM

## 2016-06-09 DIAGNOSIS — G43109 Migraine with aura, not intractable, without status migrainosus: Secondary | ICD-10-CM | POA: Diagnosis not present

## 2016-06-09 MED ORDER — CLONAZEPAM 0.5 MG PO TABS: 0.5000 mg | ORAL_TABLET | Freq: Every day | ORAL | 0 refills | Status: DC | PRN

## 2016-06-09 MED ORDER — TOPIRAMATE 50 MG PO TABS: 150.0000 mg | ORAL_TABLET | Freq: Every day | ORAL | 0 refills | Status: DC

## 2016-06-09 MED ORDER — PAROXETINE HCL 10 MG PO TABS: 10.0000 mg | ORAL_TABLET | Freq: Every day | ORAL | 1 refills | Status: DC

## 2016-06-09 NOTE — Assessment & Plan Note (Addendum)
No sign of sciatica. ? Spinal stenosis, coccyx injury. Eval with X-rays.  was on cymbalta for this before but she did not like how it made her feel.

## 2016-06-09 NOTE — Assessment & Plan Note (Signed)
Moderate control on paxil.. Still using clonazepam twice weekly.  If not able to decrease benzo use.. Will increase paxil to 20 mg daily.

## 2016-06-09 NOTE — Progress Notes (Signed)
Pre visit review using our clinic review tool, if applicable. No additional management support is needed unless otherwise documented below in the visit note. 

## 2016-06-09 NOTE — Assessment & Plan Note (Signed)
Stable control when on paxil. Restart.

## 2016-06-09 NOTE — Patient Instructions (Addendum)
Increase topamax to 150 mg daily total.  Use excedrine for acute migraine treatment. Restart paxil at 10 mg daily. Try to decrease clonazepam use as able. If unable to, call for possible increase in paxil. We will call with X-ray results

## 2016-06-09 NOTE — Progress Notes (Signed)
Subjective:    Patient ID: Tiffany Donovan, female    DOB: [birth year], 30 y.o.   MRN: [id number]  HPI   30 year old female presetns for follow up.   Migraine: She is having headache 2 times a week.. Not full blown migraine.  occ wakes up with migraine.  Excedrin migraine resolves pain most of the times.  She does not have other rescue medications.  NO SE to topamax BID.Marland Kitchen Has not been on higher dose.    GAD, PMDD, moderate major depression:  Well controlled  previously on paxil  10 mg daily. Ran out 3 weeks ago... She has felt horrible off medication.  Typically using clonazepam as needed.. 2 times a week.    Chronic low back pain in tail bone, sharp pain. Off and on for 2 years.  No radiation of pain.  Legs and feet tingly. Has noted ever since she had an epidural in past.  Never had X-ray.     Review of Systems  Constitutional: Negative for fatigue and fever.  HENT: Negative for ear pain.   Eyes: Negative for pain.  Respiratory: Negative for chest tightness and shortness of breath.   Cardiovascular: Negative for chest pain, palpitations and leg swelling.  Gastrointestinal: Negative for abdominal pain.  Genitourinary: Negative for dysuria.       Objective:   Physical Exam  Constitutional: She is oriented to person, place, and time. Vital signs are normal. She appears well-developed and well-nourished. She is cooperative.  Non-toxic appearance. She does not appear ill. No distress.  Obesity   HENT:  Head: Normocephalic.  Right Ear: Hearing, tympanic membrane, external ear and ear canal normal. Tympanic membrane is not erythematous, not retracted and not bulging.  Left Ear: Hearing, tympanic membrane, external ear and ear canal normal. Tympanic membrane is not erythematous, not retracted and not bulging.  Nose: No mucosal edema or rhinorrhea. Right sinus exhibits no maxillary sinus tenderness and no frontal sinus tenderness. Left sinus exhibits no maxillary sinus  tenderness and no frontal sinus tenderness.  Mouth/Throat: Uvula is midline, oropharynx is clear and moist and mucous membranes are normal.  Eyes: Conjunctivae, EOM and lids are normal. Pupils are equal, round, and reactive to light. Lids are everted and swept, no foreign bodies found.  Neck: Trachea normal and normal range of motion. Neck supple. Carotid bruit is not present. No thyroid mass and no thyromegaly present.  Cardiovascular: Normal rate, regular rhythm, S1 normal, S2 normal, normal heart sounds, intact distal pulses and normal pulses.  Exam reveals no gallop and no friction rub.   No murmur heard. Pulmonary/Chest: Effort normal and breath sounds normal. No tachypnea. No respiratory distress. She has no decreased breath sounds. She has no wheezes. She has no rhonchi. She has no rales.  Abdominal: Soft. Normal appearance and bowel sounds are normal. There is no tenderness.  Musculoskeletal:       Right shoulder: She exhibits tenderness and bony tenderness. She exhibits normal range of motion.       Lumbar back: She exhibits tenderness and bony tenderness.       Back:  Also ttp over coccyx.  neg SLR, positive faber for back pain, no anterior hip pain.  Neurological: She is alert and oriented to person, place, and time. She has normal strength and normal reflexes. No cranial nerve deficit or sensory deficit. She exhibits normal muscle tone. She displays a negative Romberg sign. Coordination and gait normal. GCS eye subscore is 4. GCS verbal subscore  is 5. GCS motor subscore is 6.  Nml cerebellar exam   No papilledema  Skin: Skin is warm, dry and intact. No rash noted.  Psychiatric: She has a normal mood and affect. Her speech is normal and behavior is normal. Judgment and thought content normal. Her mood appears not anxious. Cognition and memory are normal. Cognition and memory are not impaired. She does not exhibit a depressed mood. She exhibits normal recent memory and normal remote  memory.          Assessment & Plan:

## 2016-06-09 NOTE — Assessment & Plan Note (Signed)
Increase topiramate to 150 mg daily to decrease migraine frequency.  Take at bedtime to minimize SE.

## 2016-06-12 ENCOUNTER — Encounter: Payer: Self-pay | Admitting: Family Medicine

## 2016-06-14 ENCOUNTER — Other Ambulatory Visit: Payer: Self-pay

## 2016-06-14 ENCOUNTER — Telehealth: Payer: Self-pay

## 2016-06-14 DIAGNOSIS — G43109 Migraine with aura, not intractable, without status migrainosus: Secondary | ICD-10-CM

## 2016-06-14 MED ORDER — TOPIRAMATE 50 MG PO TABS: 150.0000 mg | ORAL_TABLET | Freq: Every day | ORAL | 0 refills | Status: DC

## 2016-06-14 NOTE — Telephone Encounter (Signed)
Patient is calling because she had her Topamax rx sent to [facility] and she won't receive it until December 1st. Patient's insurance has already authorized a 30 day supply. Patient just needs a new rx sent to [facility] on [address] for 30 days.

## 2016-06-14 NOTE — Telephone Encounter (Signed)
Pt said topamax has to be sent to [facility] due to ins. Spoke with Maudie Mercury at [facility] and cancelled topamax rx. Advised pt done.

## 2016-06-18 MED ORDER — TOPIRAMATE 50 MG PO TABS: 150.0000 mg | ORAL_TABLET | Freq: Every day | ORAL | 0 refills | Status: DC

## 2016-06-18 NOTE — Telephone Encounter (Signed)
Rx sent as requested.

## 2016-07-11 ENCOUNTER — Other Ambulatory Visit: Payer: Self-pay | Admitting: Family Medicine

## 2016-07-11 DIAGNOSIS — F419 Anxiety disorder, unspecified: Secondary | ICD-10-CM

## 2016-07-12 NOTE — Telephone Encounter (Signed)
Last office visit 06/09/2016.  Last refilled 06/09/2016 for #30 with no refills.  Ok to refill?

## 2016-07-13 NOTE — Telephone Encounter (Signed)
Pt called for status of clonazepam refill; advised called to [facility] [address] at 11:40 today; pt will ck with pharmxcy.

## 2016-07-13 NOTE — Telephone Encounter (Signed)
Called in to [id number] - [place] ([place]), [place] [address] [phone]  

## 2016-08-02 ENCOUNTER — Other Ambulatory Visit: Payer: Self-pay | Admitting: Family Medicine

## 2016-08-02 DIAGNOSIS — F3342 Major depressive disorder, recurrent, in full remission: Secondary | ICD-10-CM

## 2016-08-17 ENCOUNTER — Ambulatory Visit (INDEPENDENT_AMBULATORY_CARE_PROVIDER_SITE_OTHER): Payer: 59 | Admitting: Family Medicine

## 2016-08-17 ENCOUNTER — Encounter: Payer: Self-pay | Admitting: Family Medicine

## 2016-08-17 VITALS — BP 90/62 | HR 64 | Temp 97.6°F | Ht 62.5 in | Wt 215.2 lb

## 2016-08-17 DIAGNOSIS — F321 Major depressive disorder, single episode, moderate: Secondary | ICD-10-CM

## 2016-08-17 DIAGNOSIS — G43109 Migraine with aura, not intractable, without status migrainosus: Secondary | ICD-10-CM

## 2016-08-17 DIAGNOSIS — Z23 Encounter for immunization: Secondary | ICD-10-CM

## 2016-08-17 DIAGNOSIS — F411 Generalized anxiety disorder: Secondary | ICD-10-CM

## 2016-08-17 DIAGNOSIS — Z0000 Encounter for general adult medical examination without abnormal findings: Secondary | ICD-10-CM

## 2016-08-17 DIAGNOSIS — F3342 Major depressive disorder, recurrent, in full remission: Secondary | ICD-10-CM

## 2016-08-17 DIAGNOSIS — G8929 Other chronic pain: Secondary | ICD-10-CM | POA: Diagnosis not present

## 2016-08-17 DIAGNOSIS — M545 Low back pain, unspecified: Secondary | ICD-10-CM

## 2016-08-17 DIAGNOSIS — F419 Anxiety disorder, unspecified: Secondary | ICD-10-CM

## 2016-08-17 DIAGNOSIS — Z124 Encounter for screening for malignant neoplasm of cervix: Secondary | ICD-10-CM

## 2016-08-17 DIAGNOSIS — G43001 Migraine without aura, not intractable, with status migrainosus: Secondary | ICD-10-CM

## 2016-08-17 DIAGNOSIS — Z13220 Encounter for screening for lipoid disorders: Secondary | ICD-10-CM

## 2016-08-17 DIAGNOSIS — Z113 Encounter for screening for infections with a predominantly sexual mode of transmission: Secondary | ICD-10-CM

## 2016-08-17 MED ORDER — TOPIRAMATE 50 MG PO TABS: 150.0000 mg | ORAL_TABLET | Freq: Every day | ORAL | 1 refills | Status: DC

## 2016-08-17 MED ORDER — PAROXETINE HCL 20 MG PO TABS: 20.0000 mg | ORAL_TABLET | Freq: Every day | ORAL | 1 refills | Status: DC

## 2016-08-17 MED ORDER — CLONAZEPAM 0.5 MG PO TABS: ORAL_TABLET | ORAL | 0 refills | Status: DC

## 2016-08-17 MED ORDER — BUTALBITAL-APAP-CAFFEINE 50-325-40 MG PO TABS: 1.0000 | ORAL_TABLET | Freq: Four times a day (QID) | ORAL | 0 refills | Status: DC | PRN

## 2016-08-17 NOTE — Addendum Note (Signed)
Addended by: Marchia Bond on: 08/17/2016 02:04 PM   Modules accepted: Orders

## 2016-08-17 NOTE — Assessment & Plan Note (Signed)
Continue topamax at higher dose given SE not bothersome. For acute  Migraines will use fioricet.

## 2016-08-17 NOTE — Patient Instructions (Addendum)
Increase paxil to 20 mg at bedtime.  Try to limit cloazepam as mood improving.  Try not to skip meals.  Start home PT.. Call in 2 week if not improving for referral to PT.

## 2016-08-17 NOTE — Assessment & Plan Note (Signed)
Refused counseling.  Stress reduction.  Increase paxil  To 20 mg daily, limit clonazepam.

## 2016-08-17 NOTE — Progress Notes (Signed)
Subjective:    Patient ID: Tiffany Donovan, female    DOB: [birth year], 31 y.o.   MRN: [id number]  HPI The patient is here for annual wellness exam and preventative care.    Major moderate depression, GAD:  Improved control back on paxil (She feels she has had 75%  Initial improvement in mood, but going through a breakup) but uses clonazepam 1 tab  DAILY in last few weeks for anxiety. Uses when heart racing, feeling overwhelmed. Sleeping poorly at night. She has been more anxious and stressed since breakup. She still lives with ex. She has family she can talk to.  Migraine without aura, improving: on 150 mg Topamax at bedtime.  Milder headaches, no migraine since being on higher dose.She is having tingling in hands.  Needs acute migraine med.. SE to sumatriptan in past, did not help with migraine  ibuprofen and excedrine does not help. Fioricet has helped in past.   Low back pain, tail bone sharp pain. Xray of lumbar and sacrum, no fractures in 05/2016.  No radiation of pain. No weakness.    Due for repeat fasting labs to eval chol and  FBS given morbid obesity  Body mass index is 38.74 kg/m.   Wt Readings from Last 3 Encounters:  08/17/16 215 lb 4 oz (97.6 kg)  06/09/16 223 lb (101.2 kg)  04/21/16 217 lb 8 oz (98.7 kg)    Social History /Family History/Past Medical History reviewed and updated if needed.  Review of Systems  Constitutional: Negative for fatigue and fever.  HENT: Negative for ear pain.   Eyes: Negative for pain.  Respiratory: Negative for chest tightness and shortness of breath.   Cardiovascular: Negative for chest pain, palpitations and leg swelling.  Gastrointestinal: Negative for abdominal pain.  Genitourinary: Negative for dysuria.       Objective:   Physical Exam  Constitutional: She is oriented to person, place, and time. Vital signs are normal. She appears well-developed and well-nourished. She is cooperative.  Non-toxic appearance. She does  not appear ill. No distress.  Obesity   HENT:  Head: Normocephalic.  Right Ear: Hearing, tympanic membrane, external ear and ear canal normal. Tympanic membrane is not erythematous, not retracted and not bulging.  Left Ear: Hearing, tympanic membrane, external ear and ear canal normal. Tympanic membrane is not erythematous, not retracted and not bulging.  Nose: Nose normal. No mucosal edema or rhinorrhea. Right sinus exhibits no maxillary sinus tenderness and no frontal sinus tenderness. Left sinus exhibits no maxillary sinus tenderness and no frontal sinus tenderness.  Mouth/Throat: Uvula is midline, oropharynx is clear and moist and mucous membranes are normal.  Eyes: Conjunctivae, EOM and lids are normal. Pupils are equal, round, and reactive to light. Lids are everted and swept, no foreign bodies found.  Neck: Trachea normal and normal range of motion. Neck supple. Carotid bruit is not present. No thyroid mass and no thyromegaly present.  Cardiovascular: Normal rate, regular rhythm, S1 normal, S2 normal, normal heart sounds, intact distal pulses and normal pulses.  Exam reveals no gallop and no friction rub.   No murmur heard. Pulmonary/Chest: Effort normal and breath sounds normal. No tachypnea. No respiratory distress. She has no decreased breath sounds. She has no wheezes. She has no rhonchi. She has no rales.  Abdominal: Soft. Normal appearance and bowel sounds are normal. She exhibits no distension, no fluid wave, no abdominal bruit and no mass. There is no hepatosplenomegaly. There is no tenderness. There is no rebound, no  guarding and no CVA tenderness. No hernia.  Genitourinary: Vagina normal and uterus normal. No breast swelling, tenderness, discharge or bleeding. Pelvic exam was performed with patient supine. There is no rash, tenderness or lesion on the right labia. There is no rash, tenderness or lesion on the left labia. Uterus is not enlarged and not tender. Cervix exhibits no motion  tenderness, no discharge and no friability. Right adnexum displays no mass, no tenderness and no fullness. Left adnexum displays no mass, no tenderness and no fullness.  Musculoskeletal:       Right shoulder: She exhibits tenderness and bony tenderness. She exhibits normal range of motion.       Lumbar back: She exhibits tenderness and bony tenderness.       Back:  Also ttp over coccyx.  neg SLR, positive faber for back pain, no anterior hip pain.  Lymphadenopathy:    She has no cervical adenopathy.    She has no axillary adenopathy.  Neurological: She is alert and oriented to person, place, and time. She has normal strength and normal reflexes. No cranial nerve deficit or sensory deficit. She exhibits normal muscle tone. She displays a negative Romberg sign. Coordination and gait normal. GCS eye subscore is 4. GCS verbal subscore is 5. GCS motor subscore is 6.  Nml cerebellar exam   No papilledema  Skin: Skin is warm, dry and intact. No rash noted.  Psychiatric: She has a normal mood and affect. Her speech is normal and behavior is normal. Judgment and thought content normal. Her mood appears not anxious. Cognition and memory are normal. Cognition and memory are not impaired. She does not exhibit a depressed mood. She exhibits normal recent memory and normal remote memory.          Assessment & Plan:  The patient's preventative maintenance and recommended screening tests for an annual wellness exam were reviewed in full today. Brought up to date unless services declined.  Counselled on the importance of diet, exercise, and its role in overall health and mortality. The patient's FH and SH was reviewed, including their home life, tobacco status, and drug and alcohol status.   Diet: poor given mood, skipping meals Exercise: 30 min daily on elliptical  Vaccines: due for tdap (will do) and flu ( refused) Pap/DVE:  due Mammo: not indicated, no early family hx of breast cancer  Smoking  Status:nonsmoker ETOH/ drug use: occ/ none  STD/ HIV screen:   will do

## 2016-08-17 NOTE — Progress Notes (Signed)
Pre visit review using our clinic review tool, if applicable. No additional management support is needed unless otherwise documented below in the visit note. 

## 2016-08-17 NOTE — Addendum Note (Signed)
Addended by: Carter Kitten on: 08/17/2016 11:19 AM   Modules accepted: Orders

## 2016-08-17 NOTE — Assessment & Plan Note (Signed)
Neg X-ray, no fracture. Likely ligamentous/MSK pain.  Start home PT.  Consider referral to PT if not improving.

## 2016-08-19 LAB — RPR: RPR Ser Ql: NONREACTIVE

## 2016-08-19 LAB — HIV ANTIBODY (ROUTINE TESTING W REFLEX): HIV Screen 4th Generation wRfx: NONREACTIVE

## 2016-08-19 LAB — HEPATITIS PANEL, ACUTE
Hep A IgM: NEGATIVE
Hep B C IgM: NEGATIVE
Hep C Virus Ab: <0.1 s/co ratio (ref 0.0–0.9)
Hepatitis B Surface Ag: NEGATIVE

## 2016-08-19 LAB — LIPID PANEL
Chol/HDL Ratio: 2.1 ratio units (ref 0.0–4.4)
Cholesterol, Total: 169 mg/dL (ref 100–199)
HDL: 80 mg/dL (ref >39)
LDL Calculated: 80 mg/dL (ref 0–99)
Triglycerides: 47 mg/dL (ref 0–149)
VLDL Cholesterol Cal: 9 mg/dL (ref 5–40)

## 2016-08-19 LAB — COMPREHENSIVE METABOLIC PANEL
ALT: 11 IU/L (ref 0–32)
AST: 15 IU/L (ref 0–40)
Albumin/Globulin Ratio: 1.9 (ref 1.2–2.2)
Albumin: 4.3 g/dL (ref 3.5–5.5)
Alkaline Phosphatase: 51 IU/L (ref 39–117)
BUN/Creatinine Ratio: 21 (ref 9–23)
BUN: 15 mg/dL (ref 6–20)
Bilirubin Total: 0.4 mg/dL (ref 0.0–1.2)
CO2: 22 mmol/L (ref 18–29)
Calcium: 8.7 mg/dL (ref 8.7–10.2)
Chloride: 106 mmol/L (ref 96–106)
Creatinine, Ser: 0.72 mg/dL (ref 0.57–1.00)
GFR calc Af Amer: 130 mL/min/{1.73_m2} (ref >59)
GFR calc non Af Amer: 113 mL/min/{1.73_m2} (ref >59)
Globulin, Total: 2.3 g/dL (ref 1.5–4.5)
Glucose: 82 mg/dL (ref 65–99)
Potassium: 4.4 mmol/L (ref 3.5–5.2)
Sodium: 140 mmol/L (ref 134–144)
Total Protein: 6.6 g/dL (ref 6.0–8.5)

## 2016-08-22 LAB — PAP LB, CT-NG NAA, HPV HIGH-RISK
Chlamydia, Nuc. Acid Amp: NEGATIVE
Gonococcus, Nuc. Acid Amp: NEGATIVE
HPV, high-risk: NEGATIVE
PAP Smear Comment: 0

## 2016-08-30 ENCOUNTER — Telehealth: Payer: Self-pay

## 2016-08-30 NOTE — Telephone Encounter (Signed)
Pt left v/m; pt last seen 08/17/16 for annual; paxil was increased from 10 mg to 20 mg and since then pt has had nausea and h/as. Pt request [email or web address].

## 2016-08-31 NOTE — Telephone Encounter (Signed)
Tiffany Donovan notified as instructed by telephone.  She states she is taking the Paxil at bedtime on a full stomach.  She states she has had headaches everyday all day since increasing the Paxil to 20 mg.  Ibuprofen is not helping so she did have to start taking the Fioricet.  She also complains of being sleepy and tired all day long.  Please advise.

## 2016-08-31 NOTE — Telephone Encounter (Signed)
Is she taking this at bedtime and with full stomach? If not, do so, that may help with nausea. Nausea should improve with time 1-2 weeks.. If not severe continue med.

## 2016-08-31 NOTE — Telephone Encounter (Signed)
Left message for Pearson to go back to taking Paxil 10 mg daily.  If mood is not controlled enough on 10 mg after another 2 weeks, then call us back to let us know.

## 2016-08-31 NOTE — Telephone Encounter (Signed)
Left message for Tiffany Donovan to return my call.

## 2016-08-31 NOTE — Telephone Encounter (Signed)
Decrease back to 10 mg daily.. If mood not controlled enough on this dose after another 2 weeks let me know.

## 2016-09-07 NOTE — Telephone Encounter (Signed)
Kerston notified as instructed by telephone.  She states she has seen a headache specialist is the past and they did not help her at all.  Please advise.

## 2016-09-07 NOTE — Telephone Encounter (Signed)
Left message for Tiffany Donovan to return my call.

## 2016-09-07 NOTE — Telephone Encounter (Signed)
Left message for Tiffany Donovan to call the office and schedule office visit with Dr. Diona Browner to discuss options for her headaches.

## 2016-09-07 NOTE — Telephone Encounter (Signed)
Have her make and appt to be seen to consider other options.

## 2016-09-07 NOTE — Telephone Encounter (Signed)
Pt left v/m; pt has been taking Fioricet for migraine h/a's. Migraines are not getting any better. The Fioricet is not effective; pt having h/as every day continuously. Pt request cb.

## 2016-09-07 NOTE — Telephone Encounter (Signed)
Recommend referral to headache specialist.. Let me know if pt agreeable.

## 2016-09-15 ENCOUNTER — Ambulatory Visit: Payer: 59 | Admitting: Family Medicine

## 2016-09-29 ENCOUNTER — Ambulatory Visit: Payer: 59 | Admitting: Family Medicine

## 2016-10-13 ENCOUNTER — Other Ambulatory Visit: Payer: Self-pay | Admitting: Family Medicine

## 2016-10-13 DIAGNOSIS — F419 Anxiety disorder, unspecified: Secondary | ICD-10-CM

## 2016-10-13 NOTE — Telephone Encounter (Signed)
Refuse 90 day supply. Hoping she can decrease use of these.

## 2016-10-13 NOTE — Telephone Encounter (Signed)
Fioricet and clonazepam called into [facility], [place] Phone: [phone]

## 2016-10-13 NOTE — Telephone Encounter (Signed)
Last office visit 08/17/2016.  Last refilled Fioricet 09/17/2016 for #20 with no refills.  Clonazepam 08/17/16 for #30 with no refills.  Pharmacy is requesting 90 day supply.

## 2016-10-18 ENCOUNTER — Encounter: Payer: Self-pay | Admitting: Emergency Medicine

## 2016-10-18 ENCOUNTER — Emergency Department
Admission: EM | Admit: 2016-10-18 | Discharge: 2016-10-18 | Disposition: A | Discharge status: Home / Self Care | Payer: 59 | Attending: Emergency Medicine | Admitting: Emergency Medicine

## 2016-10-18 ENCOUNTER — Emergency Department: Payer: 59

## 2016-10-18 DIAGNOSIS — R1084 Generalized abdominal pain: Secondary | ICD-10-CM

## 2016-10-18 LAB — URINALYSIS, COMPLETE (UACMP) WITH MICROSCOPIC
Bacteria, UA: NONE SEEN
Bilirubin Urine: NEGATIVE
Glucose, UA: NEGATIVE mg/dL
Ketones, ur: NEGATIVE mg/dL
Leukocytes, UA: NEGATIVE
Nitrite: NEGATIVE
Protein, ur: NEGATIVE mg/dL
Specific Gravity, Urine: 1.019 (ref 1.005–1.030)
pH: 7 (ref 5.0–8.0)

## 2016-10-18 LAB — CBC
HCT: 37 % (ref 35.0–47.0)
Hemoglobin: 12.9 g/dL (ref 12.0–16.0)
MCH: 31.4 pg (ref 26.0–34.0)
MCHC: 34.9 g/dL (ref 32.0–36.0)
MCV: 89.9 fL (ref 80.0–100.0)
Platelets: 254 10*3/uL (ref 150–440)
RBC: 4.11 MIL/uL (ref 3.80–5.20)
RDW: 13.4 % (ref 11.5–14.5)
WBC: 6 10*3/uL (ref 3.6–11.0)

## 2016-10-18 LAB — COMPREHENSIVE METABOLIC PANEL
ALT: 13 U/L — ABNORMAL LOW (ref 14–54)
AST: 17 U/L (ref 15–41)
Albumin: 4 g/dL (ref 3.5–5.0)
Alkaline Phosphatase: 41 U/L (ref 38–126)
Anion gap: 5 (ref 5–15)
BUN: 12 mg/dL (ref 6–20)
CO2: 23 mmol/L (ref 22–32)
Calcium: 8.4 mg/dL — ABNORMAL LOW (ref 8.9–10.3)
Chloride: 112 mmol/L — ABNORMAL HIGH (ref 101–111)
Creatinine, Ser: 0.79 mg/dL (ref 0.44–1.00)
GFR calc Af Amer: >60 mL/min (ref >60)
GFR calc non Af Amer: >60 mL/min (ref >60)
Glucose, Bld: 92 mg/dL (ref 65–99)
Potassium: 4.1 mmol/L (ref 3.5–5.1)
Sodium: 140 mmol/L (ref 135–145)
Total Bilirubin: 0.5 mg/dL (ref 0.3–1.2)
Total Protein: 6.9 g/dL (ref 6.5–8.1)

## 2016-10-18 LAB — LIPASE, BLOOD: Lipase: 17 U/L (ref 11–51)

## 2016-10-18 LAB — POCT PREGNANCY, URINE: Preg Test, Ur: NEGATIVE

## 2016-10-18 MED ORDER — FAMOTIDINE 20 MG PO TABS: 20.0000 mg | ORAL_TABLET | Freq: Two times a day (BID) | ORAL | 1 refills | Status: DC

## 2016-10-18 MED ORDER — POLYETHYLENE GLYCOL 3350 17 G PO PACK: 17.0000 g | PACK | Freq: Every day | ORAL | 0 refills | Status: DC

## 2016-10-18 MED ORDER — GI COCKTAIL ~~LOC~~
30.0000 mL | Freq: Once | ORAL | Status: AC
  Administered 2016-10-18: 30 mL via ORAL
  Filled 2016-10-18: qty 30

## 2016-10-18 NOTE — ED Provider Notes (Signed)
[facility] Emergency Department Provider Note       Time seen: ----------------------------------------- 8:43 PM on 10/18/2016 -----------------------------------------     I have reviewed the triage vital signs and the nursing notes.   HISTORY   Chief Complaint Abdominal Pain    HPI Tiffany Donovan is a 31 y.o. female who presents to the ED for general is abdominal pain on and off for the last week. Patient states nothing makes it better or worse, initially she thought she was constipated but she's been moving her bowels. She denies fevers, chills, chest pain, shortness of breath, vomiting or diarrhea. Pain is currently 7 out of 10 diffusely in the upper abdomen   Past Medical History:  Diagnosis Date  . Anxiety   . Low back pain   . Migraine headache   . Premenstrual dysphoric disorder     Patient Active Problem List   Diagnosis Date Noted  . Chronic low back pain without sciatica 06/02/2015  . GAD (generalized anxiety disorder) 06/02/2015  . Moderate major depression ([place]) 04/02/2012  . Routine general medical examination at a health care facility 02/21/2011  . PMDD (premenstrual dysphoric disorder) 02/21/2011  . MORBID OBESITY 11/25/2008  . Migraine without aura 11/25/2008  . Allergic rhinitis due to allergen 11/25/2008    History reviewed. No pertinent surgical history.  Allergies Patient has no known allergies.  Social History Social History  Substance Use Topics  . Smoking status: Never Smoker  . Smokeless tobacco: Never Used  . Alcohol use 0.0 oz/week     Comment: occasionally    Review of Systems Constitutional: Negative for fever. Cardiovascular: Negative for chest pain. Respiratory: Negative for shortness of breath. Gastrointestinal: Positive for abdominal pain Genitourinary: Negative for dysuria. Musculoskeletal: Negative for back pain. Skin: Negative for rash. Neurological: Negative for headaches, focal weakness  or numbness.  10-point ROS otherwise negative.  ____________________________________________   PHYSICAL EXAM:  VITAL SIGNS: ED Triage Vitals  Enc Vitals Group     BP 10/18/16 1843 (!) 120/52     Pulse Rate 10/18/16 1843 (!) 56     Resp 10/18/16 1843 16     Temp 10/18/16 1843 97.8 F (36.6 C)     Temp Source 10/18/16 1843 Oral     SpO2 10/18/16 1843 98 %     Weight 10/18/16 1844 212 lb (96.2 kg)     Height 10/18/16 1844 5\' 3"  (1.6 m)     Head Circumference --      Peak Flow --      Pain Score 10/18/16 1843 7     Pain Loc --      Pain Edu? --      Excl. in [place]? --     Constitutional: Alert and oriented. Well appearing and in no distress. Eyes: Conjunctivae are normal. PERRL. Normal extraocular movements. ENT   Head: Normocephalic and atraumatic.   Nose: No congestion/rhinnorhea.   Mouth/Throat: Mucous membranes are moist.   Neck: No stridor. Cardiovascular: Normal rate, regular rhythm. No murmurs, rubs, or gallops. Respiratory: Normal respiratory effort without tachypnea nor retractions. Breath sounds are clear and equal bilaterally. No wheezes/rales/rhonchi. Gastrointestinal: Soft and nontender. Normal bowel sounds Musculoskeletal: Nontender with normal range of motion in extremities. No lower extremity tenderness nor edema. Neurologic:  Normal speech and language. No gross focal neurologic deficits are appreciated.  Skin:  Skin is warm, dry and intact. No rash noted. Psychiatric: Mood and affect are normal. Speech and behavior are normal.  ____________________________________________  ED COURSE:  Pertinent labs & imaging results that were available during my care of the patient were reviewed by me and considered in my medical decision making (see chart for details). Patient presents for Abdominal pain, we will assess with labs and imaging as indicated.   Procedures ____________________________________________   LABS (pertinent  positives/negatives)  Labs Reviewed  COMPREHENSIVE METABOLIC PANEL - Abnormal; Notable for the following:       Result Value   Chloride 112 (*)    Calcium 8.4 (*)    ALT 13 (*)    All other components within normal limits  URINALYSIS, COMPLETE (UACMP) WITH MICROSCOPIC - Abnormal; Notable for the following:    Color, Urine YELLOW (*)    APPearance CLEAR (*)    Hgb urine dipstick SMALL (*)    Squamous Epithelial / LPF 0-5 (*)    All other components within normal limits  LIPASE, BLOOD  CBC  POC URINE PREG, ED  POCT PREGNANCY, URINE    RADIOLOGY Images were viewed by me  Abdomen 2 view  IMPRESSION: Normal bowel gas pattern.  Metal staple overlying the right iliac crest, possibly within the cecum. ____________________________________________  FINAL ASSESSMENT AND PLAN  Abdominal pain  Plan: Patient's labs and imaging were dictated above. Patient had presented for Abdominal pain which is likely multifactorial. I will encourage MiraLAX as well as antacids. No explanation for the foreign body which appears to be an ingested closed staple.   Earleen Newport, MD   Note: This note was generated in part or whole with voice recognition software. Voice recognition is usually quite accurate but there are transcription errors that can and very often do occur. I apologize for any typographical errors that were not detected and corrected.     Earleen Newport, MD 10/18/16 2131

## 2016-10-18 NOTE — ED Triage Notes (Signed)
Pt comes into the ED via POV c/o abdominal pain that is generalized.  Patient denies any vomiting or diarrhea.  States the pain has been off and on for 1 week.  Patient in NAD at this time with even and unlabored respirations.  Denies any chest pain, shortness of breath, or dizziness.

## 2016-10-18 NOTE — ED Notes (Signed)
Patient transported to X-ray 

## 2016-10-24 ENCOUNTER — Ambulatory Visit: Payer: 59 | Admitting: Family Medicine

## 2016-10-26 ENCOUNTER — Other Ambulatory Visit: Payer: Self-pay | Admitting: Family Medicine

## 2016-10-26 DIAGNOSIS — F419 Anxiety disorder, unspecified: Secondary | ICD-10-CM

## 2016-10-27 NOTE — Telephone Encounter (Signed)
Last office visit 08/17/2016.  Last refilled 10/13/2016 for #30 with no refills.  Ok to refill?

## 2016-11-16 ENCOUNTER — Ambulatory Visit (INDEPENDENT_AMBULATORY_CARE_PROVIDER_SITE_OTHER): Payer: 59 | Admitting: Family Medicine

## 2016-11-16 ENCOUNTER — Encounter: Payer: Self-pay | Admitting: Family Medicine

## 2016-11-16 VITALS — BP 90/62 | HR 80 | Temp 98.4°F | Ht 62.5 in | Wt 208.5 lb

## 2016-11-16 DIAGNOSIS — G43511 Persistent migraine aura without cerebral infarction, intractable, with status migrainosus: Secondary | ICD-10-CM

## 2016-11-16 MED ORDER — PROMETHAZINE HCL 25 MG PO TABS: 25.0000 mg | ORAL_TABLET | Freq: Four times a day (QID) | ORAL | 2 refills | Status: DC | PRN

## 2016-11-16 MED ORDER — AMITRIPTYLINE HCL 10 MG PO TABS: 10.0000 mg | ORAL_TABLET | Freq: Every day | ORAL | 2 refills | Status: DC

## 2016-11-16 MED ORDER — KETOROLAC TROMETHAMINE 60 MG/2ML IM SOLN
60.0000 mg | Freq: Once | INTRAMUSCULAR | Status: AC
  Administered 2016-11-16: 60 mg via INTRAMUSCULAR

## 2016-11-16 NOTE — Progress Notes (Signed)
Pre visit review using our clinic review tool, if applicable. No additional management support is needed unless otherwise documented below in the visit note. 

## 2016-11-16 NOTE — Progress Notes (Signed)
Dr. Frederico Hamman T. Kaliya Shreiner, MD, [facility] [place], [address] Phone: [phone] Fax: [phone]  11/16/2016  Patient: Tiffany Donovan, MRN: [id number], DOB: Jul 18, 1986, 31 y.o.  Primary Physician:  Eliezer Lofts, MD   Chief Complaint  Patient presents with  . Migraine   Subjective:   Tiffany Donovan is a 31 y.o. very pleasant female patient who presents with the following:  Migraine since Tuesday. She is currently doing poorly from a migraine management standpoint, and she is having approximately 3 headaches per week currently.  Her current headache has been ongoing since Tuesday.  On Topamax for over a year. Was making her way to sleepy. Cut it back to 2 tablets.   Has tried imitrex. Makes feel sick. She has seen the headache clinic in the past, and she has been unable to tolerate many of the medications.  She has been unable to tolerate triptan's.  She does not think that she has taken not try cyclic antidepressant before for headache management, and she has not taken a beta blocker.  Blood pressure has been persistently low.  TCA?  Acute migraine today Toradol  Phenergan  Past Medical History, Surgical History, Social History, Family History, Problem List, Medications, and Allergies have been reviewed and updated if relevant.  Patient Active Problem List   Diagnosis Date Noted  . Chronic low back pain without sciatica 06/02/2015  . GAD (generalized anxiety disorder) 06/02/2015  . Moderate major depression ([place]) 04/02/2012  . Routine general medical examination at a health care facility 02/21/2011  . PMDD (premenstrual dysphoric disorder) 02/21/2011  . MORBID OBESITY 11/25/2008  . Migraine without aura 11/25/2008  . Allergic rhinitis due to allergen 11/25/2008    Past Medical History:  Diagnosis Date  . Anxiety   . Low back pain   . Migraine headache   . Premenstrual dysphoric disorder     No past  surgical history on file.  Social History   Social History  . Marital status: Single    Spouse name: N/A  . Number of children: 2  . Years of education: N/A   Occupational History  . customer service [employer]   Social History Main Topics  . Smoking status: Never Smoker  . Smokeless tobacco: Never Used  . Alcohol use 0.0 oz/week     Comment: occasionally  . Drug use: No  . Sexual activity: Yes    Partners: Female   Other Topics Concern  . Not on file   Social History Narrative   Regular exercise- no      Diet: fruits and veggies..but, just eats 1 meal a day    Family History  Problem Relation Age of Onset  . Diabetes Father   . Kidney disease Father   . Hypertension Father   . Migraines Sister   . Depression Mother   . Depression Brother   . Migraines Sister   . Coronary artery disease Paternal Grandmother   . Coronary artery disease Paternal Grandfather     No Known Allergies  Medication list reviewed and updated in full in [place].   GEN: No acute illnesses, no fevers, chills. GI: No n/v/d, eating normally Pulm: No SOB Interactive and getting along well at home.  Otherwise, ROS is as per the HPI.  Objective:   BP 90/62   Pulse 80   Temp 98.4 F (36.9 C) (Oral)   Ht 5' 2.5" (1.588 m)  Wt 208 lb 8 oz (94.6 kg)   BMI 37.53 kg/m   GEN: WDWN, NAD, Non-toxic, A & O x 3 HEENT: Atraumatic, Normocephalic. Neck supple. No masses, No LAD. Ears and Nose: No external deformity. CV: RRR, No M/G/R. No JVD. No thrill. No extra heart sounds. PULM: CTA B, no wheezes, crackles, rhonchi. No retractions. No resp. distress. No accessory muscle use. EXTR: No c/c/e NEURO Normal gait.  PSYCH: Normally interactive. Conversant. Not depressed or anxious appearing.  Calm demeanor.   Neuro: CN 2-12 grossly intact. PERRLA. EOMI. Sensation intact throughout. Str 5/5 all extremities. DTR 2+. No clonus. A and o x 4. Romberg neg. Finger nose neg. Heel -shin neg.      Laboratory and Imaging Data:  Assessment and Plan:   Migraine aura, persistent, intractable, with status migrainosus - Plan: ketorolac (TORADOL) injection 60 mg  Toradol acutely.  Phenergan 25 mg.  Take 1 tablet when she gets home.  She can use this for emergent headache management along with her Fioricet.  Add amitriptyline at 10 mg.  After one week if able increase to 2 tablets at night.  Close follow-up with PCP for migraine management given recent decompensation.  Follow-up: Return in about 2 weeks (around 11/30/2016) for f/u Dr. Diona Browner.  Meds ordered this encounter  Medications  . amitriptyline (ELAVIL) 10 MG tablet    Sig: Take 1 tablet (10 mg total) by mouth at bedtime.    Dispense:  30 tablet    Refill:  2  . promethazine (PHENERGAN) 25 MG tablet    Sig: Take 1 tablet (25 mg total) by mouth every 6 (six) hours as needed for nausea (headache).    Dispense:  30 tablet    Refill:  2  . ketorolac (TORADOL) injection 60 mg   There are no discontinued medications. No orders of the defined types were placed in this encounter.   Signed,  Maud Deed. Moselle Rister, MD   Allergies as of 11/16/2016   No Known Allergies     Medication List       Accurate as of 11/16/16 11:59 PM. Always use your most recent med list.          amitriptyline 10 MG tablet Commonly known as:  ELAVIL Take 1 tablet (10 mg total) by mouth at bedtime.   butalbital-acetaminophen-caffeine 50-325-40 MG tablet Commonly known as:  FIORICET, ESGIC TAKE ONE TO TWO TABLETS BY MOUTH EVERY 6 HOURS AS NEEDED FOR HEADACHE   clonazePAM 0.5 MG tablet Commonly known as:  KLONOPIN TAKE ONE TABLET BY MOUTH ONCE DAILY AS NEEDED FOR ANXIETY   famotidine 20 MG tablet Commonly known as:  PEPCID Take 1 tablet (20 mg total) by mouth 2 (two) times daily.   PARoxetine 20 MG tablet Commonly known as:  PAXIL Take 1 tablet (20 mg total) by mouth daily.   polyethylene glycol packet Commonly known as:  MIRALAX /  GLYCOLAX Take 17 g by mouth daily.   promethazine 25 MG tablet Commonly known as:  PHENERGAN Take 1 tablet (25 mg total) by mouth every 6 (six) hours as needed for nausea (headache).   topiramate 50 MG tablet Commonly known as:  TOPAMAX Take 3 tablets (150 mg total) by mouth at bedtime.

## 2016-11-17 ENCOUNTER — Other Ambulatory Visit: Payer: Self-pay | Admitting: Family Medicine

## 2016-11-18 NOTE — Telephone Encounter (Signed)
Last office visit 11/16/16 with Dr. Lorelei Pont for Migraine.  Last refilled 10/13/16 for #20 with no refills.  Ok to refill?

## 2016-11-21 ENCOUNTER — Other Ambulatory Visit: Payer: Self-pay | Admitting: Family Medicine

## 2016-11-21 NOTE — Telephone Encounter (Signed)
Fioricet called into [facility], [place] Phone: [phone].

## 2016-12-04 ENCOUNTER — Ambulatory Visit (INDEPENDENT_AMBULATORY_CARE_PROVIDER_SITE_OTHER): Payer: 59 | Admitting: Family Medicine

## 2016-12-04 ENCOUNTER — Encounter: Payer: Self-pay | Admitting: Family Medicine

## 2016-12-04 VITALS — BP 100/64 | HR 66 | Temp 98.5°F | Ht 62.5 in | Wt 212.8 lb

## 2016-12-04 DIAGNOSIS — G43001 Migraine without aura, not intractable, with status migrainosus: Secondary | ICD-10-CM | POA: Diagnosis not present

## 2016-12-04 DIAGNOSIS — F411 Generalized anxiety disorder: Secondary | ICD-10-CM | POA: Diagnosis not present

## 2016-12-04 DIAGNOSIS — F321 Major depressive disorder, single episode, moderate: Secondary | ICD-10-CM

## 2016-12-04 DIAGNOSIS — G4440 Drug-induced headache, not elsewhere classified, not intractable: Secondary | ICD-10-CM | POA: Diagnosis not present

## 2016-12-04 MED ORDER — BUPROPION HCL ER (XL) 150 MG PO TB24: 150.0000 mg | ORAL_TABLET | Freq: Every day | ORAL | 3 refills | Status: DC

## 2016-12-04 NOTE — Patient Instructions (Addendum)
Stop daily medications like Excedrin , ibuprofen and fiorcet as not helping with headaches.  Stop paxil.  Start wellbutrin in AM for mood.  Please stop at the front desk to set up referral. Keep up with water, healthy diet, regular exercise. Continue topamax for now.

## 2016-12-04 NOTE — Assessment & Plan Note (Addendum)
Poor control.  Topamax not helping and higher dose not tolerated.  SE to amitriptyline .. fatigue  BBlocker contraindicated given low BP.  Needs better control of mood but SSRIs may be contributing to fatigue and  Not helping with migraine. SE at higher dose paxil.  Refer to neuro.

## 2016-12-04 NOTE — Progress Notes (Signed)
Subjective:    Patient ID: Tiffany Donovan, female    DOB: 1986-05-14, 31 y.o.   MRN: [id number]  HPI  31 year old female patient presents for follow up on moderate major depression, GAD and migraines.   At last OV with me in 07/2016.Marland Kitchen paxil was increased to 20 mg daily, recommended limiting clonazepam.  She was unable to tolerate higher dose.. Had SE of worsened headaches.  Saw Dr. Lorelei Pont 4/26 for acute migraine. Treated with toradol and phenergan.  Started on amitriptyline for migraine prophylaxis. Alsosing topamax for migraine prophylaxsis , but had to decrease dose to 100 daily as higher dose 150 mg  was making her too sleepy.. Fioricet for acute migraine.  [facility]. SE to triptans.  Today she reports:  Depression: Poor control on paxil 10 mg daily.  Has anhedonia, depressed mood,  She is sleepy all the time. Trouble concentrating.  She has tried [employer] and cymbalta in past..  Cymbalta interfered with sex drive, not sure why stopped effexor. She does have panic attacks every few days. Using clonazepam 2 times a week.  PHQ9: 17  Migraines:  She is having migraine 2-3 times a week, last entire day. Has milder daily headaches. Fioricet does not help much, occ trying ibuprofen or excedrine. Decreases pain slightly. Using a med every day.  Amitriptyline at bedtime made her too sleepy.   She has been out from 4/24 form work needs FMLA completed.   BBlocker not ideal given low BP. BP Readings from Last 3 Encounters:  12/04/16 100/64  11/16/16 90/62  10/18/16 113/81    Review of Systems  Constitutional: Negative for fatigue and fever.  HENT: Negative for ear pain.   Eyes: Negative for pain.  Respiratory: Negative for chest tightness and shortness of breath.   Cardiovascular: Negative for chest pain, palpitations and leg swelling.  Gastrointestinal: Negative for abdominal pain.  Genitourinary: Negative for dysuria.       Objective:   Physical Exam  Constitutional: She is oriented to person, place, and time. Vital signs are normal. She appears well-developed and well-nourished. She is cooperative.  Non-toxic appearance. She does not appear ill. No distress.  HENT:  Head: Normocephalic.  Right Ear: Hearing, tympanic membrane, external ear and ear canal normal. Tympanic membrane is not erythematous, not retracted and not bulging.  Left Ear: Hearing, tympanic membrane, external ear and ear canal normal. Tympanic membrane is not erythematous, not retracted and not bulging.  Nose: No mucosal edema or rhinorrhea. Right sinus exhibits no maxillary sinus tenderness and no frontal sinus tenderness. Left sinus exhibits no maxillary sinus tenderness and no frontal sinus tenderness.  Mouth/Throat: Uvula is midline, oropharynx is clear and moist and mucous membranes are normal.  Eyes: Conjunctivae, EOM and lids are normal. Pupils are equal, round, and reactive to light. Lids are everted and swept, no foreign bodies found.  Neck: Trachea normal and normal range of motion. Neck supple. Carotid bruit is not present. No thyroid mass and no thyromegaly present.  Cardiovascular: Normal rate, regular rhythm, S1 normal, S2 normal, normal heart sounds, intact distal pulses and normal pulses.  Exam reveals no gallop and no friction rub.   No murmur heard. Pulmonary/Chest: Effort normal and breath sounds normal. No tachypnea. No respiratory distress. She has no decreased breath sounds. She has no wheezes. She has no rhonchi. She has no rales.  Abdominal: Soft. Normal appearance and bowel sounds are normal. There is no tenderness.  Neurological: She  is alert and oriented to person, place, and time. She has normal strength and normal reflexes. No cranial nerve deficit or sensory deficit. She exhibits normal muscle tone. She displays a negative Romberg sign. Coordination and gait normal. GCS eye subscore is 4. GCS verbal subscore is 5. GCS motor subscore is  6.  Nml cerebellar exam   No papilledema  Skin: Skin is warm, dry and intact. No rash noted.  Psychiatric: She has a normal mood and affect. Her speech is normal and behavior is normal. Judgment and thought content normal. Her mood appears not anxious. Cognition and memory are normal. Cognition and memory are not impaired. She does not exhibit a depressed mood. She exhibits normal recent memory and normal remote memory.          Assessment & Plan:

## 2016-12-04 NOTE — Assessment & Plan Note (Signed)
Likely component of headache. Stop daily medications.

## 2016-12-04 NOTE — Assessment & Plan Note (Signed)
Se to paxil at hogher dose.  SE to effexor and cymbalta in past.  Will try trial of wellbutrin as more activating and pt with fatigue.

## 2016-12-05 ENCOUNTER — Telehealth: Payer: Self-pay | Admitting: Family Medicine

## 2016-12-05 DIAGNOSIS — Z7689 Persons encountering health services in other specified circumstances: Secondary | ICD-10-CM

## 2016-12-05 NOTE — Telephone Encounter (Signed)
In outbox

## 2016-12-05 NOTE — Telephone Encounter (Signed)
Left message asking pt to call office Does she need continuous leave if how long or intermittent leave if so how often she is out of work

## 2016-12-05 NOTE — Telephone Encounter (Signed)
Paperwork in Dr Diona Browner in box for review and signature    Spoke to pt she stated she has been out of work since 4/24.

## 2016-12-07 NOTE — Telephone Encounter (Signed)
Paperwork faxed °Copy for pt  °Copy for file °Copy for scan °Copy for billing ° °Left message letting pt know paperwork has been faxed °

## 2016-12-13 ENCOUNTER — Telehealth: Payer: Self-pay | Admitting: Family Medicine

## 2016-12-13 NOTE — Telephone Encounter (Signed)
[facility] faxed attending providers statement In dr Diona Browner in box for review and signature

## 2016-12-19 NOTE — Telephone Encounter (Signed)
Paperwork faxed Copy for file Copy for scan Copy for pt  Left message letting pt know paperwork has been faxed

## 2016-12-22 ENCOUNTER — Ambulatory Visit (INDEPENDENT_AMBULATORY_CARE_PROVIDER_SITE_OTHER): Payer: 59 | Admitting: Family Medicine

## 2016-12-22 ENCOUNTER — Encounter: Payer: Self-pay | Admitting: Family Medicine

## 2016-12-22 DIAGNOSIS — F321 Major depressive disorder, single episode, moderate: Secondary | ICD-10-CM | POA: Diagnosis not present

## 2016-12-22 DIAGNOSIS — F419 Anxiety disorder, unspecified: Secondary | ICD-10-CM

## 2016-12-22 DIAGNOSIS — F411 Generalized anxiety disorder: Secondary | ICD-10-CM

## 2016-12-22 MED ORDER — CLONAZEPAM 0.5 MG PO TABS: ORAL_TABLET | ORAL | 0 refills | Status: DC

## 2016-12-22 NOTE — Assessment & Plan Note (Signed)
Worsened on wellbutrin.Marland Kitchen Has been needing more clonazpam.  If too much stimulation persisting.. Will have to stop wellbutrin.

## 2016-12-22 NOTE — Progress Notes (Signed)
   Subjective:    Patient ID: Tiffany Donovan, female    DOB: January 27, 1986, 31 y.o.   MRN: [id number]  HPI    31 year old female presents for 2 week follow up on mood. At last OV on 5/14.Marland Kitchen PHQ9 17 SE to paxil at higher dose.  SE to effexor and cymbalta in past.  Will try trial of wellbutrin as more activating and pt with fatigue.  Also weaning topamax at bedtime given SE of fatigue... Now on 50 mg.  Today: She reports she feels that her depression and anxiety is worse yesterday and today.  She is not sure she is having SE to coming off other medications.  She now has improved energy.. But trouble falling asleep at night. 3 hours of sleep at night. She is using clonazepam once daily in last 2 weeks.  PHQ9: 9   Migraine daily..  She has been trying to not take any OTC med for migraine.  Has appt with neuro next week.  Review of Systems  Constitutional: Negative for fatigue and fever.  HENT: Negative for ear pain.   Eyes: Negative for pain.  Respiratory: Negative for chest tightness and shortness of breath.   Cardiovascular: Negative for chest pain, palpitations and leg swelling.  Gastrointestinal: Negative for abdominal pain.  Genitourinary: Negative for dysuria.       Objective:   Physical Exam  Constitutional: Vital signs are normal. She appears well-developed and well-nourished. She is cooperative.  Non-toxic appearance. She does not appear ill. No distress.  HENT:  Head: Normocephalic.  Right Ear: Hearing, tympanic membrane, external ear and ear canal normal. Tympanic membrane is not erythematous, not retracted and not bulging.  Left Ear: Hearing, tympanic membrane, external ear and ear canal normal. Tympanic membrane is not erythematous, not retracted and not bulging.  Nose: No mucosal edema or rhinorrhea. Right sinus exhibits no maxillary sinus tenderness and no frontal sinus tenderness. Left sinus exhibits no maxillary sinus tenderness and no frontal sinus tenderness.    Mouth/Throat: Uvula is midline, oropharynx is clear and moist and mucous membranes are normal.  Eyes: Conjunctivae, EOM and lids are normal. Pupils are equal, round, and reactive to light. Lids are everted and swept, no foreign bodies found.  Neck: Trachea normal and normal range of motion. Neck supple. Carotid bruit is not present. No thyroid mass and no thyromegaly present.  Cardiovascular: Normal rate, regular rhythm, S1 normal, S2 normal, normal heart sounds, intact distal pulses and normal pulses.  Exam reveals no gallop and no friction rub.   No murmur heard. Pulmonary/Chest: Effort normal and breath sounds normal. No tachypnea. No respiratory distress. She has no decreased breath sounds. She has no wheezes. She has no rhonchi. She has no rales.  Abdominal: Soft. Normal appearance and bowel sounds are normal. There is no tenderness.  Neurological: She is alert.  Skin: Skin is warm, dry and intact. No rash noted.  Psychiatric: Judgment and thought content normal. Her mood appears not anxious. Her affect is blunt. Her speech is not rapid and/or pressured. She is slowed and withdrawn. She is not agitated and not aggressive. Cognition and memory are normal. She exhibits a depressed mood.          Assessment & Plan:

## 2016-12-22 NOTE — Assessment & Plan Note (Signed)
Improved energy and but worsened sleep on wellbutrin.  Unclear if better energy from wellbutrin alone or just weaning off topamax. Improved PHQ9, but may be due to improved energy.  Will given wellbutrin more time and allow her to get used to it.  Close follow up in 2 weeks.  If not improving consider change to lexapro or sertraline

## 2016-12-22 NOTE — Patient Instructions (Signed)
Continue wellbutrin for now.  Helpline number [phone].  Call if interested in referral to counselor.

## 2016-12-22 NOTE — Progress Notes (Signed)
Pre visit review using our clinic review tool, if applicable. No additional management support is needed unless otherwise documented below in the visit note. 

## 2016-12-29 ENCOUNTER — Ambulatory Visit (INDEPENDENT_AMBULATORY_CARE_PROVIDER_SITE_OTHER): Payer: 59 | Admitting: Neurology

## 2016-12-29 ENCOUNTER — Encounter: Payer: Self-pay | Admitting: Neurology

## 2016-12-29 VITALS — BP 114/80 | HR 73 | Ht 62.5 in | Wt 210.0 lb

## 2016-12-29 DIAGNOSIS — G43711 Chronic migraine without aura, intractable, with status migrainosus: Secondary | ICD-10-CM | POA: Diagnosis not present

## 2016-12-29 DIAGNOSIS — R519 Headache, unspecified: Secondary | ICD-10-CM

## 2016-12-29 DIAGNOSIS — R51 Headache with orthostatic component, not elsewhere classified: Secondary | ICD-10-CM

## 2016-12-29 DIAGNOSIS — G43719 Chronic migraine without aura, intractable, without status migrainosus: Secondary | ICD-10-CM | POA: Insufficient documentation

## 2016-12-29 DIAGNOSIS — H539 Unspecified visual disturbance: Secondary | ICD-10-CM | POA: Diagnosis not present

## 2016-12-29 MED ORDER — CYCLOBENZAPRINE HCL 10 MG PO TABS: 10.0000 mg | ORAL_TABLET | Freq: Every day | ORAL | 3 refills | Status: DC

## 2016-12-29 MED ORDER — METHYLPREDNISOLONE 4 MG PO TBPK
ORAL_TABLET | ORAL | 1 refills | Status: DC
Start: 2016-12-29 — End: 2017-04-13

## 2016-12-29 MED ORDER — PROCHLORPERAZINE MALEATE 5 MG PO TABS
5.0000 mg | ORAL_TABLET | Freq: Four times a day (QID) | ORAL | 6 refills | Status: DC | PRN
Start: 2016-12-29 — End: 2017-04-13

## 2016-12-29 NOTE — Patient Instructions (Addendum)
Remember to drink plenty of fluid, eat healthy meals and do not skip any meals. Try to eat protein with a every meal and eat a healthy snack such as fruit or nuts in between meals. Try to keep a regular sleep-wake schedule and try to exercise daily, particularly in the form of walking, 20-30 minutes a day, if you can.   As far as your medications are concerned, I would like to suggest:  Flexeril at bedtime: Watch for sedation and do not drive until he no how these medications affect you.  Compazine every 6 hours as needed for headache/migraine/nausea. Watch for sedation and do not drive until he no how these medications affect you. Medrol dosepak: Tape pills every morning daily with breakfast for 6 days  Erenumab 70mg  monthly  As far as diagnostic testing: MRI brain w/wo contrast  I would like to see you back in 8 weeks, sooner if we need to. Please call us with any interim questions, concerns, problems, updates or refill requests.   Our phone number is [phone]. We also have an after hours call service for urgent matters and there is a physician on-call for urgent questions. For any emergencies you know to call 911 or go to the nearest emergency room  Methylprednisolone tablets What is this medicine? METHYLPREDNISOLONE (meth ill pred NISS oh lone) is a corticosteroid. It is commonly used to treat inflammation of the skin, joints, lungs, and other organs. Common conditions treated include asthma, allergies, and arthritis. It is also used for other conditions, such as blood disorders and diseases of the adrenal glands. This medicine may be used for other purposes; ask your health care provider or pharmacist if you have questions. COMMON BRAND NAME(S): Medrol, Medrol Dosepak What should I tell my health care provider before I take this medicine? They need to know if you have any of these conditions: -Cushing's syndrome -eye disease, vision problems -diabetes -glaucoma -heart  disease -high blood pressure -infection (especially a virus infection such as chickenpox, cold sores, or herpes) -liver disease -mental illness -myasthenia gravis -osteoporosis -recently received or scheduled to receive a vaccine -seizures -stomach or intestine problems -thyroid disease -an unusual or allergic reaction to lactose, methylprednisolone, other medicines, foods, dyes, or preservatives -pregnant or trying to get pregnant -breast-feeding How should I use this medicine? Take this medicine by mouth with a glass of water. Follow the directions on the prescription label. Take this medicine with food. If you are taking this medicine once a day, take it in the morning. Do not take it more often than directed. Do not suddenly stop taking your medicine because you may develop a severe reaction. Your doctor will tell you how much medicine to take. If your doctor wants you to stop the medicine, the dose may be slowly lowered over time to avoid any side effects. Talk to your pediatrician regarding the use of this medicine in children. Special care may be needed. Overdosage: If you think you have taken too much of this medicine contact a poison control center or emergency room at once. NOTE: This medicine is only for you. Do not share this medicine with others. What if I miss a dose? If you miss a dose, take it as soon as you can. If it is almost time for your next dose, talk to your doctor or health care professional. You may need to miss a dose or take an extra dose. Do not take double or extra doses without advice. What may interact with this  medicine? Do not take this medicine with any of the following medications: -alefacept -echinacea -live virus vaccines -metyrapone -mifepristone This medicine may also interact with the following medications: -amphotericin B -aspirin and aspirin-like medicines -certain antibiotics like erythromycin, clarithromycin, troleandomycin -certain  medicines for diabetes -certain medicines for fungal infections like ketoconazole -certain medicines for seizures like carbamazepine, phenobarbital, phenytoin -certain medicines that treat or prevent blood clots like warfarin -cholestyramine -cyclosporine -digoxin -diuretics -female hormones, like estrogens and birth control pills -isoniazid -NSAIDs, medicines for pain inflammation, like ibuprofen or naproxen -other medicines for myasthenia gravis -rifampin -vaccines This list may not describe all possible interactions. Give your health care provider a list of all the medicines, herbs, non-prescription drugs, or dietary supplements you use. Also tell them if you smoke, drink alcohol, or use illegal drugs. Some items may interact with your medicine. What should I watch for while using this medicine? Tell your doctor or healthcare professional if your symptoms do not start to get better or if they get worse. Do not stop taking except on your doctor's advice. You may develop a severe reaction. Your doctor will tell you how much medicine to take. This medicine may increase your risk of getting an infection. Tell your doctor or health care professional if you are around anyone with measles or chickenpox, or if you develop sores or blisters that do not heal properly. This medicine may affect blood sugar levels. If you have diabetes, check with your doctor or health care professional before you change your diet or the dose of your diabetic medicine. Tell your doctor or health care professional right away if you have any change in your eyesight. Using this medicine for a long time may increase your risk of low bone mass. Talk to your doctor about bone health. What side effects may I notice from receiving this medicine? Side effects that you should report to your doctor or health care professional as soon as possible: -allergic reactions like skin rash, itching or hives, swelling of the face, lips, or  tongue -bloody or tarry stools -changes in vision -hallucination, loss of contact with reality -muscle cramps -muscle pain -palpitations -signs and symptoms of high blood sugar such as dizziness; dry mouth; dry skin; fruity breath; nausea; stomach pain; increased hunger or thirst; increased urination -signs and symptoms of infection like fever or chills; cough; sore throat; pain or trouble passing urine -trouble passing urine or change in the amount of urine Side effects that usually do not require medical attention (report to your doctor or health care professional if they continue or are bothersome): -changes in emotions or mood -constipation -diarrhea -excessive hair growth on the face or body -headache -nausea, vomiting -trouble sleeping -weight gain This list may not describe all possible side effects. Call your doctor for medical advice about side effects. You may report side effects to FDA at 1-800-FDA-1088. Where should I keep my medicine? Keep out of the reach of children. Store at room temperature between 20 and 25 degrees C (68 and 77 degrees F). Throw away any unused medicine after the expiration date. NOTE: This sheet is a summary. It may not cover all possible information. If you have questions about this medicine, talk to your doctor, pharmacist, or health care provider.  2018 [employer] Standard (2015-09-16 15:53:30)   Cyclobenzaprine tablets What is this medicine? CYCLOBENZAPRINE (sye kloe BEN za preen) is a muscle relaxer. It is used to treat muscle pain, spasms, and stiffness. This medicine may be used for other purposes;  ask your health care provider or pharmacist if you have questions. COMMON BRAND NAME(S): Fexmid, Flexeril What should I tell my health care provider before I take this medicine? They need to know if you have any of these conditions: -heart disease, irregular heartbeat, or previous heart attack -liver disease -thyroid problem -an unusual or  allergic reaction to cyclobenzaprine, tricyclic antidepressants, lactose, other medicines, foods, dyes, or preservatives -pregnant or trying to get pregnant -breast-feeding How should I use this medicine? Take this medicine by mouth with a glass of water. Follow the directions on the prescription label. If this medicine upsets your stomach, take it with food or milk. Take your medicine at regular intervals. Do not take it more often than directed. Talk to your pediatrician regarding the use of this medicine in children. Special care may be needed. Overdosage: If you think you have taken too much of this medicine contact a poison control center or emergency room at once. NOTE: This medicine is only for you. Do not share this medicine with others. What if I miss a dose? If you miss a dose, take it as soon as you can. If it is almost time for your next dose, take only that dose. Do not take double or extra doses. What may interact with this medicine? Do not take this medicine with any of the following medications: -certain medicines for fungal infections like fluconazole, itraconazole, ketoconazole, posaconazole, voriconazole -cisapride -dofetilide -dronedarone -halofantrine -levomethadyl -MAOIs like Carbex, Eldepryl, Marplan, Nardil, and Parnate -narcotic medicines for cough -pimozide -thioridazine -ziprasidone This medicine may also interact with the following medications: -alcohol -antihistamines for allergy, cough and cold -certain medicines for anxiety or sleep -certain medicines for cancer -certain medicines for depression like amitriptyline, fluoxetine, sertraline -certain medicines for infection like alfuzosin, chloroquine, clarithromycin, levofloxacin, mefloquine, pentamidine, troleandomycin -certain medicines for irregular heart beat -certain medicines for seizures like phenobarbital, primidone -contrast dyes -general anesthetics like halothane, isoflurane, methoxyflurane,  propofol -local anesthetics like lidocaine, pramoxine, tetracaine -medicines that relax muscles for surgery -narcotic medicines for pain -other medicines that prolong the QT interval (cause an abnormal heart rhythm) -phenothiazines like chlorpromazine, mesoridazine, prochlorperazine This list may not describe all possible interactions. Give your health care provider a list of all the medicines, herbs, non-prescription drugs, or dietary supplements you use. Also tell them if you smoke, drink alcohol, or use illegal drugs. Some items may interact with your medicine. What should I watch for while using this medicine? Tell your doctor or health care professional if your symptoms do not start to get better or if they get worse. You may get drowsy or dizzy. Do not drive, use machinery, or do anything that needs mental alertness until you know how this medicine affects you. Do not stand or sit up quickly, especially if you are an older patient. This reduces the risk of dizzy or fainting spells. Alcohol may interfere with the effect of this medicine. Avoid alcoholic drinks. If you are taking another medicine that also causes drowsiness, you may have more side effects. Give your health care provider a list of all medicines you use. Your doctor will tell you how much medicine to take. Do not take more medicine than directed. Call emergency for help if you have problems breathing or unusual sleepiness. Your mouth may get dry. Chewing sugarless gum or sucking hard candy, and drinking plenty of water may help. Contact your doctor if the problem does not go away or is severe. What side effects may I notice from receiving  this medicine? Side effects that you should report to your doctor or health care professional as soon as possible: -allergic reactions like skin rash, itching or hives, swelling of the face, lips, or tongue -breathing problems -chest pain -fast, irregular  heartbeat -hallucinations -seizures -unusually weak or tired Side effects that usually do not require medical attention (report to your doctor or health care professional if they continue or are bothersome): -headache -nausea, vomiting This list may not describe all possible side effects. Call your doctor for medical advice about side effects. You may report side effects to FDA at 1-800-FDA-1088. Where should I keep my medicine? Keep out of the reach of children. Store at room temperature between 15 and 30 degrees C (59 and 86 degrees F). Keep container tightly closed. Throw away any unused medicine after the expiration date. NOTE: This sheet is a summary. It may not cover all possible information. If you have questions about this medicine, talk to your doctor, pharmacist, or health care provider.  2018 [employer] Standard (2015-04-20 12:05:46)   Prochlorperazine tablets What is this medicine? PROCHLORPERAZINE (proe klor PER a zeen) helps to control severe nausea and vomiting. This medicine is also used to treat schizophrenia. It can also help patients who experience anxiety that is not due to psychological illness. This medicine may be used for other purposes; ask your health care provider or pharmacist if you have questions. COMMON BRAND NAME(S): Compazine What should I tell my health care provider before I take this medicine? They need to know if you have any of these conditions: -blood disorders or disease -dementia -liver disease or jaundice -Parkinson's disease -uncontrollable movement disorder -an unusual or allergic reaction to prochlorperazine, other medicines, foods, dyes, or preservatives -pregnant or trying to get pregnant -breast-feeding How should I use this medicine? Take this medicine by mouth with a glass of water. Follow the directions on the prescription label. Take your doses at regular intervals. Do not take your medicine more often than directed. Do not stop  taking this medicine suddenly. This can cause nausea, vomiting, and dizziness. Ask your doctor or health care professional for advice. Talk to your pediatrician regarding the use of this medicine in children. Special care may be needed. While this drug may be prescribed for children as young as 2 years for selected conditions, precautions do apply. Overdosage: If you think you have taken too much of this medicine contact a poison control center or emergency room at once. NOTE: This medicine is only for you. Do not share this medicine with others. What if I miss a dose? If you miss a dose, take it as soon as you can. If it is almost time for your next dose, take only that dose. Do not take double or extra doses. What may interact with this medicine? Do not take this medicine with any of the following medications: -amoxapine -antidepressants like citalopram, escitalopram, fluoxetine, paroxetine, and sertraline -deferoxamine -dofetilide -maprotiline -tricyclic antidepressants like amitriptyline, clomipramine, imipramine, nortiptyline and others This medicine may also interact with the following medications: -lithium -medicines for pain -phenytoin -propranolol -warfarin This list may not describe all possible interactions. Give your health care provider a list of all the medicines, herbs, non-prescription drugs, or dietary supplements you use. Also tell them if you smoke, drink alcohol, or use illegal drugs. Some items may interact with your medicine. What should I watch for while using this medicine? Visit your doctor or health care professional for regular checks on your progress. You may get drowsy  or dizzy. Do not drive, use machinery, or do anything that needs mental alertness until you know how this medicine affects you. Do not stand or sit up quickly, especially if you are an older patient. This reduces the risk of dizzy or fainting spells. Alcohol may interfere with the effect of this  medicine. Avoid alcoholic drinks. This medicine can reduce the response of your body to heat or cold. Dress warm in cold weather and stay hydrated in hot weather. If possible, avoid extreme temperatures like saunas, hot tubs, very hot or cold showers, or activities that can cause dehydration such as vigorous exercise. This medicine can make you more sensitive to the sun. Keep out of the sun. If you cannot avoid being in the sun, wear protective clothing and use sunscreen. Do not use sun lamps or tanning beds/booths. Your mouth may get dry. Chewing sugarless gum or sucking hard candy, and drinking plenty of water may help. Contact your doctor if the problem does not go away or is severe. What side effects may I notice from receiving this medicine? Side effects that you should report to your doctor or health care professional as soon as possible: -blurred vision -breast enlargement in men or women -breast milk in women who are not breast-feeding -chest pain, fast or irregular heartbeat -confusion, restlessness -dark yellow or brown urine -difficulty breathing or swallowing -dizziness or fainting spells -drooling, shaking, movement difficulty (shuffling walk) or rigidity -fever, chills, sore throat -involuntary or uncontrollable movements of the eyes, mouth, head, arms, and legs -seizures -stomach area pain -unusually weak or tired -unusual bleeding or bruising -yellowing of skin or eyes Side effects that usually do not require medical attention (report to your doctor or health care professional if they continue or are bothersome): -difficulty passing urine -difficulty sleeping -headache -sexual dysfunction -skin rash, or itching This list may not describe all possible side effects. Call your doctor for medical advice about side effects. You may report side effects to FDA at 1-800-FDA-1088. Where should I keep my medicine? Keep out of the reach of children. Store at room temperature  between 15 and 30 degrees C (59 and 86 degrees F). Protect from light. Throw away any unused medicine after the expiration date. NOTE: This sheet is a summary. It may not cover all possible information. If you have questions about this medicine, talk to your doctor, pharmacist, or health care provider.  2018 [employer] Standard (2011-11-28 16:59:39)

## 2016-12-29 NOTE — Progress Notes (Addendum)
[facility]    Provider:  Dr Jaynee Eagles Referring Provider: Jinny Sanders, MD Primary Care Physician:  Jinny Sanders, MD  CC:  Migraines  HPI:  Tiffany Donovan is a 31 y.o. female here as a referral from Dr. Diona Browner for migraines.  History depression, generalized anxiety disorder, panic attacks and migraines. Migraines started in her 21s. Her migraines have been worsening for 6 months with daily headaches and at least 1/2 are migrainous. No Aura. Migraines can be in the back of the head or behind the eyes on both sides, dull, sharp. Throbbing, nausea, sounds and smells make it worse, light sensitivity. Spicy foods can trigger. Worsening in the setting of not sleeping well, more depression and anxiety.  Recently she started Wellbutrin and stopped Paxil but she can't tell if it is helping with mood, has not helped for her migraines. Sisters have migraines. She has stopped all OTC medications, no medication overuse. She has been off of the Topiramate now for a week she could not tolerate it. No aura. Headaches are positional wost in the morning and with valsalva and she has vision changes. No other focal neurologic deficits, associated symptoms, inciting events or modifiable factors.   Reviewed notes, labs and imaging from outside physicians, which showed:   CBC and CMP unremarkable  Medications tried: Amitriptyline could not tolerate, Topamax cannot tolerate more than 100 mg daily and stopped wasn;t helping, Effexor, Paxil, Cymbalta, cannot tolerate triptan's, Phenergan, beta blocker contraindicated due to low blood pressure, Wellbutrin  Patient was started on Paxil and this has been increased. She saw a physician for acute migraines and she was treated with Toradol and Phenergan. She was started on amitriptyline for migraine prophylaxis. Also on Topamax as well. Had to decrease Topamax 200 mg daily as higher doses making her too sleepy. Fioricet for acute migraines. She's been to  [facility] in the [facility]. She has had side effects to triptan's. Her depression is poorly controlled. She has and he dystonia and depressed mood and sleepy all the time. Trouble concentrating. She is also tried Effexor and Cymbalta in the past. She has migraines 2-3 times a week and they can last all day, has milder daily headaches. Occasionally she uses ibuprofen or Excedrin. She is using medication every day. She was counseled to stop daily medications due to medication overuse and rebound. She was started on Wellbutrin and at next appointment reported her depression and anxiety worse. She is only getting 3 hours of sleep at night. Having migraines daily. Last appointment 12/22/2016.   Review of Systems: Patient complains of symptoms per HPI as well as the following symptoms: no CP, no SOB. Pertinent negatives and positives per HPI. All others negative.   Social History   Social History  . Marital status: Single    Spouse name: N/A  . Number of children: 2  . Years of education: N/A   Occupational History  . customer service [employer]   Social History Main Topics  . Smoking status: Never Smoker  . Smokeless tobacco: Never Used  . Alcohol use 0.0 oz/week     Comment: occasionally  . Drug use: No  . Sexual activity: Yes    Partners: Female   Other Topics Concern  . Not on file   Social History Narrative   Regular exercise- no      Diet: fruits and veggies..but, just eats 1 meal a day    Family History  Problem Relation Age of Onset  .  Diabetes Father   . Kidney disease Father   . Hypertension Father   . Migraines Sister   . Depression Mother   . Depression Brother   . Migraines Sister   . Coronary artery disease Paternal Grandmother   . Coronary artery disease Paternal Grandfather     Past Medical History:  Diagnosis Date  . Anxiety   . Low back pain   . Migraine headache   . Premenstrual dysphoric disorder     No past surgical history on  file.  Current Outpatient Prescriptions  Medication Sig Dispense Refill  . buPROPion (WELLBUTRIN XL) 150 MG 24 hr tablet Take 1 tablet (150 mg total) by mouth daily. 30 tablet 3  . clonazePAM (KLONOPIN) 0.5 MG tablet TAKE ONE TABLET BY MOUTH ONCE DAILY AS NEEDED FOR ANXIETY 30 tablet 0  . famotidine (PEPCID) 20 MG tablet Take 1 tablet (20 mg total) by mouth 2 (two) times daily. 60 tablet 1  . topiramate (TOPAMAX) 50 MG tablet Take 3 tablets (150 mg total) by mouth at bedtime. (Patient taking differently: Take 50 mg by mouth at bedtime. ) 270 tablet 1   No current facility-administered medications for this visit.     Allergies as of 12/29/2016  . (No Known Allergies)    Vitals: Ht 5' 2.5" (1.588 m)   Wt 210 lb (95.3 kg)   LMP 12/06/2016   BMI 37.80 kg/m  Last Weight:  Wt Readings from Last 1 Encounters:  12/29/16 210 lb (95.3 kg)   Last Height:   Ht Readings from Last 1 Encounters:  12/29/16 5' 2.5" (1.588 m)   Physical exam: Exam: Gen: NAD, conversant, well nourised, obese, well groomed                     CV: RRR, no MRG. No Carotid Bruits. No peripheral edema, warm, nontender Eyes: Conjunctivae clear without exudates or hemorrhage  Neuro: Detailed Neurologic Exam  Speech:    Speech is normal; fluent and spontaneous with normal comprehension.  Cognition:    The patient is oriented to person, place, and time;     recent and remote memory intact;     language fluent;     normal attention, concentration,     fund of knowledge Cranial Nerves:    The pupils are equal, round, and reactive to light. The fundi are normal and spontaneous venous pulsations are present. Visual fields are full to finger confrontation. Extraocular movements are intact. Trigeminal sensation is intact and the muscles of mastication are normal. The face is symmetric. The palate elevates in the midline. Hearing intact. Voice is normal. Shoulder shrug is normal. The tongue has normal motion without  fasciculations.   Coordination:    Normal finger to nose and heel to shin. Normal rapid alternating movements.   Gait:    Heel-toe and tandem gait are normal.   Motor Observation:    No asymmetry, no atrophy, and no involuntary movements noted. Tone:    Normal muscle tone.    Posture:    Posture is normal. normal erect    Strength:    Strength is V/V in the upper and lower limbs.      Sensation: intact to LT     Reflex Exam:  DTR's:    Deep tendon reflexes in the upper and lower extremities are normal bilaterally.   Toes:    The toes are downgoing bilaterally.   Clonus:    Clonus is absent.  Assessment/Plan:  31 year-old female with chronic migraines who has failed multiple medications and seen multiple neurologists including the [facility].   As far as your medications are concerned, I would like to suggest:  Flexeril at bedtime: Watch for sedation and do not drive until you know how these medications affect you.  Compazine every 6 hours as needed for headache/migraine/nausea. Watch for sedation and do not drive until he no how these medications affect you.  Medrol dosepak: Take pills every morning daily with breakfast for 6 days  Erenumab(Aimovog) 70mg  monthly  As far as diagnostic testing: MRI brain w/wo contrast given the positional nature of the headaches, vision changes, worsening and intractable to evaluate for lesions, space-occupying tumors and signs of IIH  To prevent or relieve headaches, try the following: Cool Compress. Lie down and place a cool compress on your head.  Avoid headache triggers. If certain foods or odors seem to have triggered your migraines in the past, avoid them. A headache diary might help you identify triggers.  Include physical activity in your daily routine. Try a daily walk or other moderate aerobic exercise.  Manage stress. Find healthy ways to cope with the stressors, such as delegating tasks on your to-do  list.  Practice relaxation techniques. Try deep breathing, yoga, massage and visualization.  Eat regularly. Eating regularly scheduled meals and maintaining a healthy diet might help prevent headaches. Also, drink plenty of fluids.  Follow a regular sleep schedule. Sleep deprivation might contribute to headaches Consider biofeedback. With this mind-body technique, you learn to control certain bodily functions - such as muscle tension, heart rate and blood pressure - to prevent headaches or reduce headache pain.    Proceed to emergency room if you experience new or worsening symptoms or symptoms do not resolve, if you have new neurologic symptoms or if headache is severe, or for any concerning symptom.   Provided education and documentation from American headache Society toolbox including articles on: chronic migraine medication overuse headache, chronic migraines, prevention of migraines, behavioral and other nonpharmacologic treatments for headache.   Sarina Ill, MD  [facility] [address] [place], [place] [address]  Phone [phone] Fax [phone]

## 2016-12-31 NOTE — Addendum Note (Signed)
Addended by: Sarina Ill on: 12/31/2016 10:39 AM   Modules accepted: Orders, Level of Service

## 2017-01-09 ENCOUNTER — Telehealth: Payer: Self-pay | Admitting: Family Medicine

## 2017-01-09 NOTE — Telephone Encounter (Signed)
Left message asking pt to call office have questions about paperwork from [employer]

## 2017-01-10 ENCOUNTER — Ambulatory Visit (INDEPENDENT_AMBULATORY_CARE_PROVIDER_SITE_OTHER): Payer: 59

## 2017-01-10 ENCOUNTER — Telehealth: Payer: Self-pay | Admitting: *Deleted

## 2017-01-10 ENCOUNTER — Telehealth: Payer: Self-pay | Admitting: Neurology

## 2017-01-10 DIAGNOSIS — R519 Headache, unspecified: Secondary | ICD-10-CM

## 2017-01-10 DIAGNOSIS — H539 Unspecified visual disturbance: Secondary | ICD-10-CM

## 2017-01-10 DIAGNOSIS — R51 Headache with orthostatic component, not elsewhere classified: Secondary | ICD-10-CM

## 2017-01-10 MED ORDER — GADOPENTETATE DIMEGLUMINE 469.01 MG/ML IV SOLN: 20.0000 mL | Freq: Once | INTRAVENOUS | Status: DC | PRN

## 2017-01-10 MED ORDER — ALPRAZOLAM 0.5 MG PO TABS: ORAL_TABLET | ORAL | 0 refills | Status: DC

## 2017-01-10 NOTE — Telephone Encounter (Signed)
Rx printed, awaiting signature. 

## 2017-01-10 NOTE — Telephone Encounter (Signed)
Rx signed and faxed to pharmacy. Left VM mssg (per DPR) to notify pt, reiterated no driving w/ sedative. May call back w/ any questions.

## 2017-01-10 NOTE — Telephone Encounter (Signed)
Spoke with pt she stated she went to work on 12/20/16 but has not been back to work since  [employer] sent attending providers statement  And mental health evaluation forms to be filled out  In dr Diona Browner in box for review and signature

## 2017-01-10 NOTE — Telephone Encounter (Signed)
Patient just called and informed me that she is really nervous and states she needs something to calm her nerves.. I did tell her she will need a driver if she takes something and she states she is going to have a driver.. Her appointment is today at our [facility] she is suppose to arrive here at 11:30 this morning.

## 2017-01-10 NOTE — Telephone Encounter (Signed)
Aimovig form successfully faxed in.

## 2017-01-11 ENCOUNTER — Telehealth: Payer: Self-pay | Admitting: *Deleted

## 2017-01-11 ENCOUNTER — Ambulatory Visit: Payer: 59 | Admitting: Family Medicine

## 2017-01-11 DIAGNOSIS — Z0289 Encounter for other administrative examinations: Secondary | ICD-10-CM

## 2017-01-11 NOTE — Telephone Encounter (Signed)
Called patient and informed her that her MRI brain result is normal. Patient verbalized understanding. She then stated she has not heard about Amovig medication. This RN advised form was faxed yesterday, and she should wait a few more days. Advised she may call back next Tues if she has not heard back, but that the company is receiving a large volume of requests, so it may be delayed.  Patient verbalized understanding, appreciation of call.

## 2017-01-12 ENCOUNTER — Telehealth: Payer: Self-pay | Admitting: Family Medicine

## 2017-01-12 NOTE — Telephone Encounter (Signed)
Patient did not come in for their appointment on 01/11/17 for mood  Please let me know if patient needs to be contacted immediately for follow up or no follow up needed. Do you want to charge the NSF?

## 2017-01-14 NOTE — Telephone Encounter (Signed)
No follow up needed. Please charge for no show.

## 2017-01-23 NOTE — Telephone Encounter (Signed)
Pt called checking on paperwork

## 2017-01-23 NOTE — Telephone Encounter (Signed)
Paperwork faxed °Copy forpt °Copy for file °Copy for scan ° °Left message letting pt know paperwork has been faxed °

## 2017-01-24 ENCOUNTER — Other Ambulatory Visit: Payer: Self-pay | Admitting: Family Medicine

## 2017-01-24 DIAGNOSIS — F3342 Major depressive disorder, recurrent, in full remission: Secondary | ICD-10-CM

## 2017-01-27 ENCOUNTER — Other Ambulatory Visit: Payer: Self-pay | Admitting: Family Medicine

## 2017-01-27 DIAGNOSIS — F419 Anxiety disorder, unspecified: Secondary | ICD-10-CM

## 2017-01-27 NOTE — Telephone Encounter (Signed)
Last office visit 12/22/16.  Last refilled 12/22/16 for #30 with no refills.  Ok to refill?

## 2017-01-29 ENCOUNTER — Telehealth: Payer: Self-pay

## 2017-01-29 NOTE — Telephone Encounter (Signed)
Clonazepam called into [facility] [place] [address] Phone: [phone]

## 2017-01-29 NOTE — Telephone Encounter (Signed)
I have only seen patient once and I do not see a reason for disability at this time. Thanks.

## 2017-01-29 NOTE — Telephone Encounter (Signed)
Pt called to follow-up on Aimovig. Let her know that service request form was faxed in on 01/10/17 and due to the volume of requests, it's taking up to 4 wks for processing and shipment. Also asked if medical leave paperwork had been received yet, which was reportedly faxed to [facility] last Thursday. Pt states that her PCP has completed ST disability paperwork in the past but has now referred her to Dr. Jaynee Eagles for her migraines. She requests that Dr. Jaynee Eagles write her out of work until she is able to start on new preventive medication. She would like to return to work if doing well after her follow-up appt in August. Says that paperwork is due on 02/01/17.

## 2017-02-02 ENCOUNTER — Other Ambulatory Visit: Payer: Self-pay | Admitting: Family Medicine

## 2017-02-02 DIAGNOSIS — G43109 Migraine with aura, not intractable, without status migrainosus: Secondary | ICD-10-CM

## 2017-02-06 ENCOUNTER — Telehealth: Payer: Self-pay | Admitting: *Deleted

## 2017-02-06 NOTE — Telephone Encounter (Signed)
Looks like Dr. Rometta Emery office (PCP) faxed FMLA paperwork in to [facility] for pt on 01/23/17. Dr. Jaynee Eagles has only seen pt once and she will need to continue any requested leave through her PCP.

## 2017-02-06 NOTE — Telephone Encounter (Signed)
I have only seen patient once. She can request my office notes if she likes, but I can't comment on disability at this time since I have only had one encounter with her.

## 2017-02-07 NOTE — Telephone Encounter (Signed)
Pt notified via TC. Copy of OV note and MRI results mailed to pt's verified address.

## 2017-02-08 NOTE — Telephone Encounter (Signed)
LVM unable to reach pt. 

## 2017-02-19 NOTE — Telephone Encounter (Signed)
Returned pt's call w/ update on Aimovig. She verbalized understanding and agreed to call back if she hasn't heard from the pharmacy by the end of this week. F/u appt also r/s'd for a few weeks out per pt request so that she will have a chance to try the new medication prior to coming back in to see Dr. Jaynee Eagles.

## 2017-02-19 NOTE — Telephone Encounter (Signed)
Pt is calling for the status of the Aimovig, she has not received anything or heard from anyone.  She is asking for a call back

## 2017-02-19 NOTE — Telephone Encounter (Signed)
Called [employer] to follow-up and spoke to support team member who said that request form was received and complete. She agreed to push it through for pt to receive her free trial.

## 2017-02-22 ENCOUNTER — Telehealth: Payer: Self-pay | Admitting: Family Medicine

## 2017-02-22 NOTE — Telephone Encounter (Signed)
[facility] faxed attending provider statement Spoke with pt she stated she wanted to go back to work 02/27/15

## 2017-02-26 ENCOUNTER — Ambulatory Visit: Payer: 59 | Admitting: Neurology

## 2017-03-23 ENCOUNTER — Ambulatory Visit: Payer: Self-pay | Admitting: Neurology

## 2017-04-02 ENCOUNTER — Encounter: Payer: Self-pay | Admitting: Neurology

## 2017-04-13 ENCOUNTER — Ambulatory Visit (INDEPENDENT_AMBULATORY_CARE_PROVIDER_SITE_OTHER): Payer: 59 | Admitting: Family Medicine

## 2017-04-13 ENCOUNTER — Encounter: Payer: Self-pay | Admitting: Family Medicine

## 2017-04-13 VITALS — BP 122/78 | HR 88 | Temp 98.4°F | Ht 62.5 in | Wt 209.0 lb

## 2017-04-13 DIAGNOSIS — F321 Major depressive disorder, single episode, moderate: Secondary | ICD-10-CM

## 2017-04-13 DIAGNOSIS — Z21 Asymptomatic human immunodeficiency virus [HIV] infection status: Secondary | ICD-10-CM

## 2017-04-13 NOTE — Patient Instructions (Signed)
Please stop at the front desk to set up referral.  

## 2017-04-13 NOTE — Progress Notes (Signed)
   Subjective:    Patient ID: Tiffany Donovan, female    DOB: 17-Jun-1986, 31 y.o.   MRN: [id number]  HPI   31 year old female presents  for medication management.  She has stopped taking all her medication given finances. She stopped 1.5 months ago. Doing fairly well with mood off clonazepam and  wellbutrin Xl. She denies SI, HI.   Had at [facility] plasma for  She has had recent testing  For HIV.[facility] Screening test positive. Repeat confirmatory test indeterminate.   Neg screening test her  in 07/2016.  No new partner, no known exposures. No drug use.    Review of Systems  Constitutional: Negative for fatigue and fever.  HENT: Negative for ear pain.   Eyes: Negative for pain.  Respiratory: Negative for chest tightness and shortness of breath.   Cardiovascular: Negative for chest pain, palpitations and leg swelling.  Gastrointestinal: Negative for abdominal pain.  Genitourinary: Negative for dysuria.       Objective:   Physical Exam  Constitutional: Vital signs are normal. She appears well-developed and well-nourished. She is cooperative.  Non-toxic appearance. She does not appear ill. No distress.  HENT:  Head: Normocephalic.  Right Ear: Hearing, tympanic membrane, external ear and ear canal normal. Tympanic membrane is not erythematous, not retracted and not bulging.  Left Ear: Hearing, tympanic membrane, external ear and ear canal normal. Tympanic membrane is not erythematous, not retracted and not bulging.  Nose: No mucosal edema or rhinorrhea. Right sinus exhibits no maxillary sinus tenderness and no frontal sinus tenderness. Left sinus exhibits no maxillary sinus tenderness and no frontal sinus tenderness.  Mouth/Throat: Uvula is midline, oropharynx is clear and moist and mucous membranes are normal.  Eyes: Pupils are equal, round, and reactive to light. Conjunctivae, EOM and lids are normal. Lids are everted and swept, no foreign bodies found.  Neck: Trachea normal and normal  range of motion. Neck supple. Carotid bruit is not present. No thyroid mass and no thyromegaly present.  Cardiovascular: Normal rate, regular rhythm, S1 normal, S2 normal, normal heart sounds, intact distal pulses and normal pulses.  Exam reveals no gallop and no friction rub.   No murmur heard. Pulmonary/Chest: Effort normal and breath sounds normal. No tachypnea. No respiratory distress. She has no decreased breath sounds. She has no wheezes. She has no rhonchi. She has no rales.  Abdominal: Soft. Normal appearance and bowel sounds are normal. There is no tenderness.  Neurological: She is alert.  Skin: Skin is warm, dry and intact. No rash noted.  Psychiatric: Her speech is normal and behavior is normal. Judgment and thought content normal. Her mood appears not anxious. Cognition and memory are normal. She does not exhibit a depressed mood.          Assessment & Plan:

## 2017-04-16 ENCOUNTER — Telehealth: Payer: Self-pay | Admitting: Family Medicine

## 2017-04-16 NOTE — Telephone Encounter (Signed)
[employer] [facility] faxed (ada) papework  In dr bedsole's in box

## 2017-04-17 ENCOUNTER — Other Ambulatory Visit: Payer: 59

## 2017-04-17 DIAGNOSIS — Z21 Asymptomatic human immunodeficiency virus [HIV] infection status: Secondary | ICD-10-CM

## 2017-04-18 LAB — HIV ANTIBODY (ROUTINE TESTING W REFLEX): HIV 1&2 Ab, 4th Generation: NONREACTIVE

## 2017-04-20 DIAGNOSIS — Z0279 Encounter for issue of other medical certificate: Secondary | ICD-10-CM

## 2017-04-22 DIAGNOSIS — Z21 Asymptomatic human immunodeficiency virus [HIV] infection status: Secondary | ICD-10-CM | POA: Insufficient documentation

## 2017-04-22 NOTE — Assessment & Plan Note (Signed)
Needs re-eval given equivocal result.

## 2017-04-22 NOTE — Assessment & Plan Note (Signed)
Improvement in mood. She has been able to wean off clonazepam and wellbutrin.

## 2017-05-08 ENCOUNTER — Telehealth: Payer: Self-pay | Admitting: *Deleted

## 2017-05-08 MED ORDER — ERENUMAB-AOOE 70 MG/ML ~~LOC~~ SOAJ: 70.0000 mg | SUBCUTANEOUS | 11 refills | Status: DC

## 2017-05-08 NOTE — Telephone Encounter (Signed)
LMVM for pt that per Dr. Jaynee Eagles and Olegario Shearer with [employer], due to high volume of response to this new drug, they recommended to have prescription sent to local pharmacy (most likely to deny and require a pa), but wanted to let her know this.  She may get calls or letters relating to denial.  (this was done to be able to use access card if needed).  Pt to call back if questions.

## 2017-05-24 NOTE — Telephone Encounter (Signed)
Received letter that Aimovig was denied.

## 2017-05-24 NOTE — Telephone Encounter (Signed)
Are we doing appeals?

## 2017-05-28 NOTE — Telephone Encounter (Signed)
Needs appeal letter

## 2017-06-13 ENCOUNTER — Encounter: Payer: Self-pay | Admitting: *Deleted

## 2017-06-13 NOTE — Telephone Encounter (Signed)
Appeal letter and office note printed & ready for MD signature.

## 2017-06-13 NOTE — Telephone Encounter (Signed)
Called [employer] and spoke with Judson Roch. She gave me the fax number for provider appeals. Appeal letter signed by MD & office note faxed to [facility] Provider Appeals # [phone]. Received a receipt of confirmation.

## 2017-09-26 ENCOUNTER — Telehealth: Payer: Self-pay | Admitting: Family Medicine

## 2017-09-26 NOTE — Telephone Encounter (Signed)
I do not see any note for telephone encounter to see what the pt needs.Please advise.

## 2017-09-26 NOTE — Telephone Encounter (Signed)
Copied from [place] [id number]. Topic: Quick Communication - See Telephone Encounter >> Sep 26, 2017  4:59 PM Cleaster Corin, NT wrote: CRM for notification. See Telephone encounter for:  09/26/17.

## 2017-11-02 NOTE — Telephone Encounter (Addendum)
Received a refill request from [facility] for Aimovig 140 mg BRIDGE. Pt's current prescription written for 70 mg. Called pt, vm box full and unable to go leave message. Called [employer]. Spoke with the representative. She stated that the patient's coverage with [employer] ended on 06/22/17.  Dr. Jaynee Eagles authorized a refill of the 140 mg dose.

## 2017-11-05 ENCOUNTER — Other Ambulatory Visit: Payer: Self-pay | Admitting: *Deleted

## 2017-11-05 MED ORDER — ERENUMAB-AOOE 70 MG/ML ~~LOC~~ SOAJ
140.0000 mg | SUBCUTANEOUS | 7 refills | Status: DC
Start: 2017-11-05 — End: 2018-08-30

## 2017-11-05 NOTE — Telephone Encounter (Signed)
Per Dr. Jaynee Eagles, ok to refill Aimovig 140 mg to [facility] specialty pharmacy. E-scribe done.   Called pt to discuss Aimovig and to confirm her address on file as it did not match the refill request. Left office number asking for call back.

## 2017-11-06 NOTE — Telephone Encounter (Signed)
Attempted to call pt again to discuss Aimovig. LVM asking for call back. Left office number in message.

## 2017-11-06 NOTE — Telephone Encounter (Addendum)
Spoke with [facility] @ [employer]. She stated that the last shipment of Aimovig should have been delivered to pt on 10/25/17. They have not been able to see quite yet that the Aimovig refill was sent on 11/05/17.   Spoke with Tiana @ [facility]. RN told that it is under the appeals status right now. RN informed that she was told the pt's coverage with [employer] ended on 06/22/17. Sophronia Simas stated she would call [employer] to discuss and find out if pt has any commercial coverage so she can continue to be part of the bridge program. She stated that the patient has received 4 fills so there are 8 remaining. However her account needs to be updated with what insurance coverage she has. If she not insured, she will need to submit an application through [employer].   RN will continue to try to get in touch with the patient. Called pt's sister on DPR, Justice Lily Peer who will also try to get in touch with the patient to have her return our call.

## 2017-11-15 ENCOUNTER — Encounter: Payer: Self-pay | Admitting: *Deleted

## 2017-11-15 NOTE — Telephone Encounter (Signed)
Called pt once more and LVM (ok per DPR) asking for a call back to discuss Aimovig and whether or not she currently has insurance. Will send letter asking for pt to call office as well for final attempt to contact patient.

## 2017-11-15 NOTE — Telephone Encounter (Signed)
Letter written with request to call office. Placed in bin to be mailed to pt.
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
## 2018-04-10 ENCOUNTER — Emergency Department (HOSPITAL_COMMUNITY): Payer: Self-pay

## 2018-04-10 ENCOUNTER — Emergency Department (HOSPITAL_COMMUNITY)
Admission: EM | Admit: 2018-04-10 | Discharge: 2018-04-10 | Disposition: A | Discharge status: Home / Self Care | Payer: Self-pay | Attending: Emergency Medicine | Admitting: Emergency Medicine

## 2018-04-10 ENCOUNTER — Encounter (HOSPITAL_COMMUNITY): Payer: Self-pay

## 2018-04-10 ENCOUNTER — Other Ambulatory Visit: Payer: Self-pay

## 2018-04-10 DIAGNOSIS — Y999 Unspecified external cause status: Secondary | ICD-10-CM | POA: Insufficient documentation

## 2018-04-10 DIAGNOSIS — B20 Human immunodeficiency virus [HIV] disease: Secondary | ICD-10-CM | POA: Insufficient documentation

## 2018-04-10 DIAGNOSIS — Y929 Unspecified place or not applicable: Secondary | ICD-10-CM | POA: Insufficient documentation

## 2018-04-10 DIAGNOSIS — Z79899 Other long term (current) drug therapy: Secondary | ICD-10-CM | POA: Insufficient documentation

## 2018-04-10 DIAGNOSIS — Y939 Activity, unspecified: Secondary | ICD-10-CM | POA: Insufficient documentation

## 2018-04-10 DIAGNOSIS — S060X0A Concussion without loss of consciousness, initial encounter: Secondary | ICD-10-CM | POA: Insufficient documentation

## 2018-04-10 LAB — I-STAT BETA HCG BLOOD, ED (MC, WL, AP ONLY): I-stat hCG, quantitative: <5 m[IU]/mL (ref <5)

## 2018-04-10 MED ORDER — ACETAMINOPHEN 325 MG PO TABS
650.0000 mg | ORAL_TABLET | Freq: Once | ORAL | Status: AC
  Administered 2018-04-10: 650 mg via ORAL
  Filled 2018-04-10: qty 2

## 2018-04-10 MED ORDER — PROMETHAZINE HCL 25 MG PO TABS: 25.0000 mg | ORAL_TABLET | Freq: Four times a day (QID) | ORAL | 0 refills | Status: DC | PRN

## 2018-04-10 MED ORDER — PROMETHAZINE HCL 25 MG PO TABS
25.0000 mg | ORAL_TABLET | Freq: Once | ORAL | Status: AC
  Administered 2018-04-10: 25 mg via ORAL
  Filled 2018-04-10: qty 1
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
## 2018-04-10 NOTE — Discharge Instructions (Addendum)
Return to the ED if you start to develop severe worsening headache, vision changes, confusion, loss of consciousness, trouble walking, nausea & vomiting, or weakness/tingling in extremities.

## 2018-08-30 ENCOUNTER — Ambulatory Visit: Payer: BLUE CROSS/BLUE SHIELD | Admitting: Family Medicine

## 2018-08-30 ENCOUNTER — Encounter: Payer: Self-pay | Admitting: Family Medicine

## 2018-08-30 VITALS — BP 90/62 | HR 82 | Temp 98.4°F | Ht 62.5 in | Wt 218.8 lb

## 2018-08-30 DIAGNOSIS — F411 Generalized anxiety disorder: Secondary | ICD-10-CM | POA: Diagnosis not present

## 2018-08-30 DIAGNOSIS — F321 Major depressive disorder, single episode, moderate: Secondary | ICD-10-CM | POA: Diagnosis not present

## 2018-08-30 DIAGNOSIS — F3281 Premenstrual dysphoric disorder: Secondary | ICD-10-CM

## 2018-08-30 MED ORDER — BUPROPION HCL ER (XL) 150 MG PO TB24: 150.0000 mg | ORAL_TABLET | Freq: Every day | ORAL | 3 refills | Status: DC

## 2018-08-30 NOTE — Patient Instructions (Addendum)
Start wellbutrin 150 mg Xl daily.  Call if interested in counseling.  Call if any intolerable side effects.

## 2018-08-30 NOTE — Progress Notes (Signed)
Subjective:    Patient ID: Tiffany Donovan, female    DOB: June 13, 1986, 33 y.o.   MRN: [id number]  HPI  33 year old female presents for discussion of treatment for MDD, GAD and PMDD.  She has had these diagnoses in the past.   She reports  She was doing well in past on wellbutrin .. since  Being off the medication.. she is more anxious, excessive worrying, emotional instability, anger, irritability.   Also down, depressed and hopeless.  Poor sleep.Marland Kitchen trouble staying asleep.Marland Kitchen tired all day.  There is a definite worsening of mood prior to menses.  She has not had insurance in the last year so has  Been off medication.   Cymbalta did not help as much as wellbutrin.  GAD 14 PHQ9 18  Social History /Family History/Past Medical History reviewed in detail and updated in EMR if needed. Blood pressure 90/62, pulse 82, temperature 98.4 F (36.9 C), temperature source Oral, height 5' 2.5" (1.588 m), weight 218 lb 12 oz (99.2 kg), last menstrual period 08/28/2018.  Review of Systems  Constitutional: Negative for fatigue and fever.  HENT: Negative for ear pain.   Eyes: Negative for pain.  Respiratory: Negative for chest tightness and shortness of breath.   Cardiovascular: Negative for chest pain, palpitations and leg swelling.  Gastrointestinal: Negative for abdominal pain.  Genitourinary: Negative for dysuria.       Objective:   Physical Exam Constitutional:      General: She is not in acute distress.    Appearance: Normal appearance. She is well-developed. She is not ill-appearing or toxic-appearing.  HENT:     Head: Normocephalic.     Right Ear: Hearing, tympanic membrane, ear canal and external ear normal. Tympanic membrane is not erythematous, retracted or bulging.     Left Ear: Hearing, tympanic membrane, ear canal and external ear normal. Tympanic membrane is not erythematous, retracted or bulging.     Nose: No mucosal edema or rhinorrhea.     Right Sinus: No maxillary sinus  tenderness or frontal sinus tenderness.     Left Sinus: No maxillary sinus tenderness or frontal sinus tenderness.     Mouth/Throat:     Pharynx: Uvula midline.  Eyes:     General: Lids are normal. Lids are everted, no foreign bodies appreciated.     Conjunctiva/sclera: Conjunctivae normal.     Pupils: Pupils are equal, round, and reactive to light.  Neck:     Musculoskeletal: Normal range of motion and neck supple.     Thyroid: No thyroid mass or thyromegaly.     Vascular: No carotid bruit.     Trachea: Trachea normal.  Cardiovascular:     Rate and Rhythm: Normal rate and regular rhythm.     Pulses: Normal pulses.     Heart sounds: Normal heart sounds, S1 normal and S2 normal. No murmur. No friction rub. No gallop.   Pulmonary:     Effort: Pulmonary effort is normal. No tachypnea or respiratory distress.     Breath sounds: Normal breath sounds. No decreased breath sounds, wheezing, rhonchi or rales.  Abdominal:     General: Bowel sounds are normal.     Palpations: Abdomen is soft.     Tenderness: There is no abdominal tenderness.  Skin:    General: Skin is warm and dry.     Findings: No rash.  Neurological:     Mental Status: She is alert.  Psychiatric:        Mood  and Affect: Mood is anxious. Mood is not depressed.        Speech: Speech normal.        Behavior: Behavior normal. Behavior is cooperative.        Thought Content: Thought content normal.        Judgment: Judgment normal.           Assessment & Plan:

## 2018-08-30 NOTE — Assessment & Plan Note (Signed)
Previously well controlled on wellbutrin.  Restart and follow up in 4 weeks.   May need to consider trazodone for sleep if not improving.

## 2018-09-27 ENCOUNTER — Ambulatory Visit: Payer: BLUE CROSS/BLUE SHIELD | Admitting: Family Medicine

## 2018-10-11 ENCOUNTER — Ambulatory Visit: Payer: BLUE CROSS/BLUE SHIELD | Admitting: Family Medicine

## 2018-10-21 ENCOUNTER — Other Ambulatory Visit: Payer: Self-pay | Admitting: Family Medicine

## 2018-11-27 ENCOUNTER — Encounter: Payer: Self-pay | Admitting: *Deleted

## 2018-11-28 ENCOUNTER — Encounter: Payer: Self-pay | Admitting: Family Medicine

## 2018-11-28 ENCOUNTER — Other Ambulatory Visit (INDEPENDENT_AMBULATORY_CARE_PROVIDER_SITE_OTHER): Payer: BLUE CROSS/BLUE SHIELD

## 2018-11-28 ENCOUNTER — Ambulatory Visit (INDEPENDENT_AMBULATORY_CARE_PROVIDER_SITE_OTHER): Payer: BLUE CROSS/BLUE SHIELD | Admitting: Family Medicine

## 2018-11-28 ENCOUNTER — Other Ambulatory Visit: Payer: Self-pay

## 2018-11-28 DIAGNOSIS — R1032 Left lower quadrant pain: Secondary | ICD-10-CM | POA: Diagnosis not present

## 2018-11-28 DIAGNOSIS — F411 Generalized anxiety disorder: Secondary | ICD-10-CM

## 2018-11-28 DIAGNOSIS — F321 Major depressive disorder, single episode, moderate: Secondary | ICD-10-CM | POA: Diagnosis not present

## 2018-11-28 MED ORDER — VENLAFAXINE HCL ER 37.5 MG PO CP24: 37.5000 mg | ORAL_CAPSULE | Freq: Every day | ORAL | 3 refills | Status: DC

## 2018-11-28 NOTE — Assessment & Plan Note (Signed)
Wellbutrin made symptoms worse.  pt would like to retry effexor... will start low dose as had benefit but possible SE when tried in 2013 ( was lost to follow up for 3 years following).   Offered counseling.. pt refused.

## 2018-11-28 NOTE — Assessment & Plan Note (Signed)
Inadequate control.   

## 2018-11-28 NOTE — Addendum Note (Signed)
Addended by: Eliezer Lofts E on: 11/28/2018 03:55 PM   Modules accepted: Orders

## 2018-11-28 NOTE — Progress Notes (Signed)
Lab 5/7 Appointment with dr Diona Browner 5/12 and 6/5

## 2018-11-28 NOTE — Assessment & Plan Note (Signed)
Difficult to assess virtually.  Will have pt come for labs and UA, micro to rule out UTI, kidney stone, infection. If not clear, pt will come in for in office eval... consider STD testing, localize pain. No GI symptoms. No MSK association and no change with movement. Denies sexual activity with a female. May need [place] to eval ovary on left for cyst etc.  No red flags for urgent eval. If pain becomes severe .. she will go to ER.

## 2018-11-28 NOTE — Progress Notes (Signed)
VIRTUAL VISIT Due to national recommendations of social distancing due to [place] 19, a virtual visit is felt to be most appropriate for this patient at this time.   I connected with the patient on 11/28/18 at 11:00 AM EDT by virtual telehealth platform and verified that I am speaking with the correct person using two identifiers.   I discussed the limitations, risks, security and privacy concerns of performing an evaluation and management service by  virtual telehealth platform and the availability of in person appointments. I also discussed with the patient that there may be a patient responsible charge related to this service. The patient expressed understanding and agreed to proceed.  Patient location: Home Provider Location: [facility] Participants: Eliezer Lofts and [name]   Chief Complaint  Patient presents with  . Depression  . Hip Pain    History of Present Illness:  33 year old female presents for follow up mood.  At last [place] 08/30/2018.Marland Kitchen restarted on Wellbutrin for MMD and GAD.  She stopped wellbutrin after 2 months... given it made things worse.She continues to have issues with depression... trouble feeling like a failure and sleeping at night  She is more irritable   And anxious than in past.  Poor body image and concerns about weight gain.   SE to paxil, effexor and cymblata. GAD 7 : Generalized Anxiety Score 08/30/2018  Nervous, Anxious, on Edge 2  Control/stop worrying 2  Worry too much - different things 2  Trouble relaxing 2  Restless 3  Easily annoyed or irritable 2  Afraid - awful might happen 1  Total GAD 7 Score 14  Anxiety Difficulty Extremely difficult    Depression screen [facility] 2/9 11/28/2018 08/30/2018 12/22/2016  Decreased Interest 1 2 1   Down, Depressed, Hopeless 1 2 1   PHQ - 2 Score 2 4 2   Altered sleeping 2 3 3   Tired, decreased energy 3 3 0  Change in appetite 0 2 3  Feeling bad or failure about yourself  2 3 1   Trouble concentrating  1 2 0  Moving slowly or fidgety/restless 0 1 0  Suicidal thoughts 0 0 0  PHQ-9 Score 10 18 9   Difficult doing work/chores Somewhat difficult Extremely dIfficult -   Today she also reports she has  1 week onset of pain in left LLQ. Ongoing x 1 week. Severe at times, intermittant, had at night and wakes up with the pain... 5-10 min.  Not any better or worse. Pain occ during the day off and on but not as severe.  Has happened in the past off and on several months ago. No change in routine.  No falls, no known injury. No N, V, D, C. No blood in stool. No dysuria. Menses regular and once a month.  no concern about pregnancy... homosexual. LMP 1.5 week ago.  No low back pain.   When she has the pain  It radiates through to the central low back.  No surgeries. COVID 19 screen No recent travel or known exposure to COVID19 The patient denies respiratory symptoms of COVID 19 at this time.  The importance of social distancing was discussed today.   Review of Systems  Constitutional: Negative for chills and fever.  HENT: Negative for congestion and ear pain.   Eyes: Negative for pain and redness.  Respiratory: Negative for cough and shortness of breath.   Cardiovascular: Negative for chest pain, palpitations and leg swelling.  Gastrointestinal: Negative for abdominal pain, blood in stool, constipation,  diarrhea, nausea and vomiting.  Genitourinary: Negative for dysuria.  Musculoskeletal: Negative for falls and myalgias.  Skin: Negative for rash.  Neurological: Negative for dizziness.  Psychiatric/Behavioral: Negative for depression. The patient is not nervous/anxious.       Past Medical History:  Diagnosis Date  . Anxiety   . Depression   . Low back pain   . Migraine headache   . Premenstrual dysphoric disorder     reports that she has never smoked. She has never used smokeless tobacco. She reports current alcohol use. She reports that she does not use drugs.   Current  Outpatient Medications:  .  buPROPion (WELLBUTRIN XL) 150 MG 24 hr tablet, TAKE 1 TABLET BY MOUTH EVERY DAY, Disp: 90 tablet, Rfl: 0 No current facility-administered medications for this visit.   Facility-Administered Medications Ordered in Other Visits:  .  gadopentetate dimeglumine (MAGNEVIST) injection 20 mL, 20 mL, Intravenous, Once PRN, Melvenia Beam, MD   Observations/Objective: There were no vitals taken for this visit.  Physical Exam  Physical Exam Constitutional:      General: The patient is not in acute distress. Pulmonary:     Effort: Pulmonary effort is normal. No respiratory distress.  Neurological:     Mental Status: The patient is alert and oriented to person, place, and time.  Psychiatric:        Mood and Affect: Mood normal.        Behavior: Behavior normal.   Assessment and Plan LLQ pain Difficult to assess virtually.  Will have pt come for labs and UA, micro to rule out UTI, kidney stone, infection. If not clear, pt will come in for in office eval... consider STD testing, localize pain. No GI symptoms. No MSK association and no change with movement. Denies sexual activity with a female. May need [place] to eval ovary on left for cyst etc.  No red flags for urgent eval. If pain becomes severe .. she will go to ER.  GAD (generalized anxiety disorder) Inadequate control.  Moderate major depression  Wellbutrin made symptoms worse.  pt would like to retry effexor... will start low dose as had benefit but possible SE when tried in 2013 ( was lost to follow up for 3 years following).   Offered counseling.. pt refused.     I discussed the assessment and treatment plan with the patient. The patient was provided an opportunity to ask questions and all were answered. The patient agreed with the plan and demonstrated an understanding of the instructions.   The patient was advised to call back or seek an in-person evaluation if the symptoms worsen or if the condition  fails to improve as anticipated.     Eliezer Lofts, MD

## 2018-11-29 LAB — URINALYSIS, COMPLETE
Bacteria, UA: NONE SEEN /HPF
Bilirubin Urine: NEGATIVE
Glucose, UA: NEGATIVE
Hgb urine dipstick: NEGATIVE
Hyaline Cast: NONE SEEN /LPF
Ketones, ur: NEGATIVE
Nitrite: NEGATIVE
Protein, ur: NEGATIVE
Specific Gravity, Urine: 1.026 (ref 1.001–1.03)
WBC, UA: NONE SEEN /HPF (ref 0–5)
pH: 5.5 (ref 5.0–8.0)

## 2018-11-29 LAB — COMPREHENSIVE METABOLIC PANEL
ALT: 17 U/L (ref 0–35)
AST: 17 U/L (ref 0–37)
Albumin: 4 g/dL (ref 3.5–5.2)
Alkaline Phosphatase: 45 U/L (ref 39–117)
BUN: 18 mg/dL (ref 6–23)
CO2: 29 mEq/L (ref 19–32)
Calcium: 8.2 mg/dL — ABNORMAL LOW (ref 8.4–10.5)
Chloride: 105 mEq/L (ref 96–112)
Creatinine, Ser: 0.63 mg/dL (ref 0.40–1.20)
GFR: 108.8 mL/min (ref >60.00)
Glucose, Bld: 74 mg/dL (ref 70–99)
Potassium: 4.1 mEq/L (ref 3.5–5.1)
Sodium: 139 mEq/L (ref 135–145)
Total Bilirubin: 0.4 mg/dL (ref 0.2–1.2)
Total Protein: 6.7 g/dL (ref 6.0–8.3)

## 2018-11-29 LAB — CBC WITH DIFFERENTIAL/PLATELET
Basophils Absolute: 0.1 10*3/uL (ref 0.0–0.1)
Basophils Relative: 1 % (ref 0.0–3.0)
Eosinophils Absolute: 0.2 10*3/uL (ref 0.0–0.7)
Eosinophils Relative: 3.9 % (ref 0.0–5.0)
HCT: 39.6 % (ref 36.0–46.0)
Hemoglobin: 13.5 g/dL (ref 12.0–15.0)
Lymphocytes Relative: 27.2 % (ref 12.0–46.0)
Lymphs Abs: 1.5 10*3/uL (ref 0.7–4.0)
MCHC: 34 g/dL (ref 30.0–36.0)
MCV: 88.8 fl (ref 78.0–100.0)
Monocytes Absolute: 0.3 10*3/uL (ref 0.1–1.0)
Monocytes Relative: 5.2 % (ref 3.0–12.0)
Neutro Abs: 3.5 10*3/uL (ref 1.4–7.7)
Neutrophils Relative %: 62.7 % (ref 43.0–77.0)
Platelets: 301 10*3/uL (ref 150.0–400.0)
RBC: 4.46 Mil/uL (ref 3.87–5.11)
RDW: 13.8 % (ref 11.5–15.5)
WBC: 5.5 10*3/uL (ref 4.0–10.5)

## 2018-12-03 ENCOUNTER — Ambulatory Visit
Admission: RE | Admit: 2018-12-03 | Discharge: 2018-12-03 | Disposition: A | Discharge status: Home / Self Care | Payer: BLUE CROSS/BLUE SHIELD | Source: Ambulatory Visit | Attending: Family Medicine | Admitting: Family Medicine

## 2018-12-03 ENCOUNTER — Ambulatory Visit: Payer: BLUE CROSS/BLUE SHIELD | Admitting: Family Medicine

## 2018-12-03 ENCOUNTER — Other Ambulatory Visit: Payer: Self-pay

## 2018-12-03 ENCOUNTER — Encounter: Payer: Self-pay | Admitting: Family Medicine

## 2018-12-03 VITALS — BP 98/57 | HR 80 | Temp 98.6°F | Resp 14 | Ht 62.5 in | Wt 221.5 lb

## 2018-12-03 DIAGNOSIS — R1032 Left lower quadrant pain: Secondary | ICD-10-CM

## 2018-12-03 DIAGNOSIS — M545 Low back pain, unspecified: Secondary | ICD-10-CM

## 2018-12-03 DIAGNOSIS — G8929 Other chronic pain: Secondary | ICD-10-CM | POA: Diagnosis not present

## 2018-12-03 NOTE — Assessment & Plan Note (Signed)
UA and labs clear.  Eval with GC and chlamydia  although pt feels unlikely.  Refer for [place] to eval ovaries and uterus.

## 2018-12-03 NOTE — Patient Instructions (Signed)
Start heat on low back, start home Stretching of low back  As well as core strengthening. Stop at front desk to set up US pelvic.

## 2018-12-03 NOTE — Assessment & Plan Note (Signed)
Chronic pain does not seem to be related to LLQ clearly... Treat with  Heat and home PT.  Avoid NSAIDs currently give nausea.

## 2018-12-03 NOTE — Progress Notes (Signed)
Subjective:    Patient ID: Tiffany Donovan, female    DOB: 06/11/1986, 33 y.o.   MRN: [id number]  HPI 33 year old female presents with continued LLQ pain. Also pain in left low back.Marland Kitchen only noting when she has the LLQ abd pain.  She has noted the pain off and on for several months...worsened and more persistent in last week. intermittant, had at night and wakes up with the pain... 5-10 min.  No change in routine.  No falls, no known injury.  Some nausea No  V, D, C. No blood in stool. No dysuria. Menses regular and once a month. LMP 1.5 week ago.   No sexual activity.  Occ low back pain...  pain in left buttock.. noting with sitting and standing poor lying in bed. No numbness or weakness in leg.  UA: negative for wbc, nitrate, rbc Cbc: wbc 5.5, hg 13.5 CMET: nml except 8.2  Hx of epidural " gone Wrong" complications per pt.. ever since  chronic low back tenderness, bialterally Social History /Family History/Past Medical History reviewed in detail and updated in EMR if needed. Blood pressure (!) 98/57, pulse 80, temperature 98.6 F (37 C), resp. rate 14, height 5' 2.5" (1.588 m), weight 221 lb 8 oz (100.5 kg), last menstrual period 11/17/2018, SpO2 97 %.   Review of Systems  Constitutional: Negative for fatigue and fever.  HENT: Negative for congestion.   Eyes: Negative for pain.  Respiratory: Negative for cough and shortness of breath.   Cardiovascular: Negative for chest pain, palpitations and leg swelling.  Gastrointestinal: Negative for abdominal pain.  Genitourinary: Negative for dysuria and vaginal bleeding.  Musculoskeletal: Negative for back pain.  Neurological: Negative for syncope, light-headedness and headaches.  Psychiatric/Behavioral: Negative for dysphoric mood.       Objective:   Physical Exam Constitutional:      General: She is not in acute distress.    Appearance: Normal appearance. She is well-developed. She is not ill-appearing or toxic-appearing.   HENT:     Head: Normocephalic.     Right Ear: Hearing, tympanic membrane, ear canal and external ear normal. Tympanic membrane is not erythematous, retracted or bulging.     Left Ear: Hearing, tympanic membrane, ear canal and external ear normal. Tympanic membrane is not erythematous, retracted or bulging.     Nose: No mucosal edema or rhinorrhea.     Right Sinus: No maxillary sinus tenderness or frontal sinus tenderness.     Left Sinus: No maxillary sinus tenderness or frontal sinus tenderness.     Mouth/Throat:     Pharynx: Uvula midline.  Eyes:     General: Lids are normal. Lids are everted, no foreign bodies appreciated.     Conjunctiva/sclera: Conjunctivae normal.     Pupils: Pupils are equal, round, and reactive to light.  Neck:     Musculoskeletal: Normal range of motion and neck supple.     Thyroid: No thyroid mass or thyromegaly.     Vascular: No carotid bruit.     Trachea: Trachea normal.  Cardiovascular:     Rate and Rhythm: Normal rate and regular rhythm.     Pulses: Normal pulses.     Heart sounds: Normal heart sounds, S1 normal and S2 normal. No murmur. No friction rub. No gallop.   Pulmonary:     Effort: Pulmonary effort is normal. No tachypnea or respiratory distress.     Breath sounds: Normal breath sounds. No decreased breath sounds, wheezing, rhonchi or rales.  Abdominal:  General: Bowel sounds are normal.     Palpations: Abdomen is soft.     Tenderness: There is abdominal tenderness in the suprapubic area and left lower quadrant. There is no right CVA tenderness, left CVA tenderness, guarding or rebound.     Hernia: No hernia is present.  Musculoskeletal:     Lumbar back: She exhibits tenderness. She exhibits normal range of motion and no bony tenderness.     Comments: ttp in left sciatic notch, ttp in bilateral low back  Skin:    General: Skin is warm and dry.     Findings: No rash.  Neurological:     Mental Status: She is alert.  Psychiatric:         Mood and Affect: Mood is not anxious or depressed.        Speech: Speech normal.        Behavior: Behavior normal. Behavior is cooperative.        Thought Content: Thought content normal.        Judgment: Judgment normal.           Assessment & Plan:

## 2018-12-04 LAB — C. TRACHOMATIS/N. GONORRHOEAE RNA
C. trachomatis RNA, TMA: NOT DETECTED
N. gonorrhoeae RNA, TMA: NOT DETECTED

## 2018-12-04 MED ORDER — ONDANSETRON HCL 4 MG PO TABS: 4.0000 mg | ORAL_TABLET | Freq: Three times a day (TID) | ORAL | 0 refills | Status: DC | PRN

## 2018-12-04 MED ORDER — HYDROCODONE-ACETAMINOPHEN 5-325 MG PO TABS: 1.0000 | ORAL_TABLET | Freq: Four times a day (QID) | ORAL | 0 refills | Status: AC | PRN

## 2018-12-08 DIAGNOSIS — N83201 Unspecified ovarian cyst, right side: Secondary | ICD-10-CM

## 2018-12-10 ENCOUNTER — Other Ambulatory Visit: Payer: Self-pay | Admitting: Family Medicine

## 2018-12-10 MED ORDER — HYDROXYZINE HCL 10 MG PO TABS: 10.0000 mg | ORAL_TABLET | Freq: Three times a day (TID) | ORAL | 0 refills | Status: DC | PRN

## 2018-12-10 NOTE — Addendum Note (Signed)
Addended by: Eliezer Lofts E on: 12/10/2018 10:47 AM   Modules accepted: Orders

## 2018-12-27 ENCOUNTER — Ambulatory Visit: Payer: BLUE CROSS/BLUE SHIELD | Admitting: Family Medicine

## 2019-01-13 ENCOUNTER — Emergency Department (HOSPITAL_COMMUNITY)
Admission: EM | Admit: 2019-01-13 | Discharge: 2019-01-13 | Disposition: A | Discharge status: Home / Self Care | Payer: BLUE CROSS/BLUE SHIELD | Attending: Emergency Medicine | Admitting: Emergency Medicine

## 2019-01-13 ENCOUNTER — Encounter: Payer: BLUE CROSS/BLUE SHIELD | Admitting: Family Medicine

## 2019-01-13 ENCOUNTER — Other Ambulatory Visit: Payer: Self-pay

## 2019-01-13 ENCOUNTER — Encounter: Payer: Self-pay | Admitting: Family Medicine

## 2019-01-13 ENCOUNTER — Encounter (HOSPITAL_COMMUNITY): Payer: Self-pay | Admitting: Emergency Medicine

## 2019-01-13 ENCOUNTER — Emergency Department (HOSPITAL_COMMUNITY): Payer: BLUE CROSS/BLUE SHIELD

## 2019-01-13 DIAGNOSIS — R1013 Epigastric pain: Secondary | ICD-10-CM | POA: Insufficient documentation

## 2019-01-13 DIAGNOSIS — R1033 Periumbilical pain: Secondary | ICD-10-CM | POA: Diagnosis present

## 2019-01-13 LAB — COMPREHENSIVE METABOLIC PANEL
ALT: 21 U/L (ref 0–44)
AST: 19 U/L (ref 15–41)
Albumin: 3.8 g/dL (ref 3.5–5.0)
Alkaline Phosphatase: 51 U/L (ref 38–126)
Anion gap: 7 (ref 5–15)
BUN: 11 mg/dL (ref 6–20)
CO2: 24 mmol/L (ref 22–32)
Calcium: 8.8 mg/dL — ABNORMAL LOW (ref 8.9–10.3)
Chloride: 107 mmol/L (ref 98–111)
Creatinine, Ser: 0.59 mg/dL (ref 0.44–1.00)
GFR calc Af Amer: >60 mL/min (ref >60)
GFR calc non Af Amer: >60 mL/min (ref >60)
Glucose, Bld: 96 mg/dL (ref 70–99)
Potassium: 4.3 mmol/L (ref 3.5–5.1)
Sodium: 138 mmol/L (ref 135–145)
Total Bilirubin: 0.5 mg/dL (ref 0.3–1.2)
Total Protein: 7 g/dL (ref 6.5–8.1)

## 2019-01-13 LAB — CBC
HCT: 38.6 % (ref 36.0–46.0)
Hemoglobin: 12.8 g/dL (ref 12.0–15.0)
MCH: 30 pg (ref 26.0–34.0)
MCHC: 33.2 g/dL (ref 30.0–36.0)
MCV: 90.4 fL (ref 80.0–100.0)
Platelets: 287 10*3/uL (ref 150–400)
RBC: 4.27 MIL/uL (ref 3.87–5.11)
RDW: 12.8 % (ref 11.5–15.5)
WBC: 5.7 10*3/uL (ref 4.0–10.5)
nRBC: 0 % (ref 0.0–0.2)

## 2019-01-13 LAB — URINALYSIS, ROUTINE W REFLEX MICROSCOPIC
Bilirubin Urine: NEGATIVE
Glucose, UA: NEGATIVE mg/dL
Hgb urine dipstick: NEGATIVE
Ketones, ur: NEGATIVE mg/dL
Leukocytes,Ua: NEGATIVE
Nitrite: NEGATIVE
Protein, ur: NEGATIVE mg/dL
Specific Gravity, Urine: 1.016 (ref 1.005–1.030)
pH: 7 (ref 5.0–8.0)

## 2019-01-13 LAB — I-STAT BETA HCG BLOOD, ED (MC, WL, AP ONLY): I-stat hCG, quantitative: <5 m[IU]/mL (ref <5)

## 2019-01-13 LAB — LIPASE, BLOOD: Lipase: 29 U/L (ref 11–51)

## 2019-01-13 MED ORDER — SODIUM CHLORIDE 0.9% FLUSH: 3.0000 mL | Freq: Once | INTRAVENOUS | Status: DC

## 2019-01-13 MED ORDER — OMEPRAZOLE 20 MG PO CPDR: 20.0000 mg | DELAYED_RELEASE_CAPSULE | Freq: Every day | ORAL | 0 refills | Status: DC

## 2019-01-13 MED ORDER — SUCRALFATE 1 G PO TABS: 1.0000 g | ORAL_TABLET | Freq: Three times a day (TID) | ORAL | 0 refills | Status: DC

## 2019-01-13 NOTE — ED Triage Notes (Signed)
Pt reports mid/upper abdominal pain that started last night. Describes it has achey but reports its better today that it was last night. Endorses, nausea, and loose stools x 1 month. NAD noted at this time. Reports similar pains before but states she was never seen then.

## 2019-01-13 NOTE — Discharge Instructions (Signed)
Please read and follow all provided instructions.  Your diagnoses today include:  1. Epigastric pain     Tests performed today include:  Blood counts and electrolytes  Blood tests to check liver and kidney function  Blood tests to check pancreas function  Urine test to look for infection and pregnancy (in women)  Ultrasound - no gallstones or other problems  Vital signs. See below for your results today.   Medications prescribed:   Omeprazole (Prilosec) - stomach acid reducer  This medication can be found over-the-counter   Carafate - for stomach upset and to protect your stomach  Take any prescribed medications only as directed.  Home care instructions:   Follow any educational materials contained in this packet.  Follow-up instructions: Please follow-up with your primary care provider for further evaluation of your symptoms.    Return instructions:  SEEK IMMEDIATE MEDICAL ATTENTION IF:  The pain does not go away or becomes severe   A temperature above 101F develops   Repeated vomiting occurs (multiple episodes)   The pain becomes localized to portions of the abdomen. The right side could possibly be appendicitis. In an adult, the left lower portion of the abdomen could be colitis or diverticulitis.   Blood is being passed in stools or vomit (bright red or black tarry stools)   You develop chest pain, difficulty breathing, dizziness or fainting, or become confused, poorly responsive, or inconsolable (young children)  If you have any other emergent concerns regarding your health  Additional Information: Abdominal (belly) pain can be caused by many things. Your caregiver performed an examination and possibly ordered blood/urine tests and imaging (CT scan, x-rays, ultrasound). Many cases can be observed and treated at home after initial evaluation in the emergency department. Even though you are being discharged home, abdominal pain can be unpredictable.  Therefore, you need a repeated exam if your pain does not resolve, returns, or worsens. Most patients with abdominal pain don't have to be admitted to the hospital or have surgery, but serious problems like appendicitis and gallbladder attacks can start out as nonspecific pain. Many abdominal conditions cannot be diagnosed in one visit, so follow-up evaluations are very important.  Your vital signs today were: BP 117/66 (BP Location: Right Arm)    Pulse 65    Temp 99.1 F (37.3 C) (Oral)    Resp 18    Ht 5\' 3"  (1.6 m)    Wt 99.8 kg    LMP 01/10/2019    SpO2 99%    BMI 38.97 kg/m  If your blood pressure (bp) was elevated above 135/85 this visit, please have this repeated by your doctor within one month. --------------

## 2019-01-13 NOTE — Telephone Encounter (Signed)
Reasonable poc 

## 2019-01-13 NOTE — ED Provider Notes (Signed)
[facility] EMERGENCY DEPARTMENT Provider Note   CSN: [id number] Arrival date & time: 01/13/19  1436     History   Chief Complaint Chief Complaint  Patient presents with   Abdominal Pain    HPI Tiffany Donovan is a 33 y.o. female.     Patient with no past surgical history presents to the emergency department today with complaint of periumbilical to upper abdominal pain.  Pain is achy and does not radiate.  She had associated nausea but no vomiting.  She had loose but not watery stools, no blood in the stools.  No urinary symptoms.  No vaginal complaints.  No fevers, chest pain or shortness of breath.  She has had similar symptoms before but last night was more persistent and severe.  She called her primary care doctor who recommended that she come to the emergency department for evaluation.  Onset of symptoms acute.  Course is improving.  Nothing makes symptoms better.  Patient did eat steak last night.     Past Medical History:  Diagnosis Date   Anxiety    Depression    Low back pain    Migraine headache    Premenstrual dysphoric disorder     Patient Active Problem List   Diagnosis Date Noted   LLQ pain 11/28/2018   Intractable chronic migraine without aura and without status migrainosus 12/29/2016   Rebound headache 12/04/2016   Low back pain 02/14/2016   Chronic low back pain without sciatica 06/02/2015   GAD (generalized anxiety disorder) 06/02/2015   Moderate major depression ([place]) 04/02/2012   Routine general medical examination at a health care facility 02/21/2011   PMDD (premenstrual dysphoric disorder) 02/21/2011   MORBID OBESITY 11/25/2008   Migraine without aura 11/25/2008   Allergic rhinitis due to allergen 11/25/2008    History reviewed. No pertinent surgical history.   OB History   No obstetric history on file.      Home Medications    Prior to Admission medications   Medication Sig Start Date End Date  Taking? Authorizing Provider  hydrOXYzine (ATARAX/VISTARIL) 10 MG tablet Take 1 tablet (10 mg total) by mouth 3 (three) times daily as needed. 12/10/18   Bedsole, Amy E, MD  ondansetron (ZOFRAN) 4 MG tablet Take 1 tablet (4 mg total) by mouth every 8 (eight) hours as needed for nausea or vomiting. 12/04/18   Jinny Sanders, MD    Family History Family History  Problem Relation Age of Onset   Diabetes Father    Kidney disease Father    Hypertension Father    Migraines Sister    Depression Mother    Depression Brother    Migraines Sister    Coronary artery disease Paternal Grandmother    Coronary artery disease Paternal Grandfather     Social History Social History   Tobacco Use   Smoking status: Never Smoker   Smokeless tobacco: Never Used  Substance Use Topics   Alcohol use: Yes    Alcohol/week: 0.0 standard drinks    Comment: occasionally   Drug use: No     Allergies   Patient has no known allergies.   Review of Systems Review of Systems  Constitutional: Negative for fever.  HENT: Negative for rhinorrhea and sore throat.   Eyes: Negative for redness.  Respiratory: Negative for cough.   Cardiovascular: Negative for chest pain.  Gastrointestinal: Positive for abdominal pain, diarrhea and nausea. Negative for vomiting.  Genitourinary: Negative for dysuria, frequency, hematuria and urgency.  Musculoskeletal: Negative for myalgias.  Skin: Negative for rash.  Neurological: Negative for headaches.     Physical Exam Updated Vital Signs BP 116/64 (BP Location: Left Arm)    Pulse 88    Temp 99.1 F (37.3 C) (Oral)    Resp 18    Ht 5\' 3"  (1.6 m)    Wt 99.8 kg    LMP 01/10/2019    SpO2 99%    BMI 38.97 kg/m   Physical Exam Vitals signs and nursing note reviewed.  Constitutional:      Appearance: She is well-developed.  HENT:     Head: Normocephalic and atraumatic.  Eyes:     General:        Right eye: No discharge.        Left eye: No discharge.      Conjunctiva/sclera: Conjunctivae normal.  Neck:     Musculoskeletal: Normal range of motion and neck supple.  Cardiovascular:     Rate and Rhythm: Normal rate and regular rhythm.     Heart sounds: Normal heart sounds.  Pulmonary:     Effort: Pulmonary effort is normal.     Breath sounds: Normal breath sounds.  Abdominal:     Palpations: Abdomen is soft.     Tenderness: There is abdominal tenderness (minimal) in the epigastric area. There is no right CVA tenderness, left CVA tenderness, guarding or rebound.  Skin:    General: Skin is warm and dry.  Neurological:     Mental Status: She is alert.      ED Treatments / Results  Labs (all labs ordered are listed, but only abnormal results are displayed) Labs Reviewed  COMPREHENSIVE METABOLIC PANEL - Abnormal; Notable for the following components:      Result Value   Calcium 8.8 (*)    All other components within normal limits  LIPASE, BLOOD  CBC  URINALYSIS, ROUTINE W REFLEX MICROSCOPIC  I-STAT BETA HCG BLOOD, ED ([facility], [facility], [facility] ONLY)    EKG None  Radiology US Abdomen Limited Ruq  Result Date: 01/13/2019 CLINICAL DATA:  Epigastric pain. EXAM: ULTRASOUND ABDOMEN LIMITED RIGHT UPPER QUADRANT COMPARISON:  None. FINDINGS: Gallbladder: Physiologically distended. No gallstones or wall thickening visualized. No sonographic Murphy sign noted by sonographer. Common bile duct: Diameter: 3 mm, normal. Liver: No focal lesion identified. Within normal limits in parenchymal echogenicity. Portal vein is patent on color Doppler imaging with normal direction of blood flow towards the liver. IMPRESSION: Negative right upper quadrant ultrasound. Electronically Signed   By: Keith Rake M.D.   On: 01/13/2019 19:05    Procedures Procedures (including critical care time)  Medications Ordered in ED Medications  sodium chloride flush (NS) 0.9 % injection 3 mL (has no administration in time range)     Initial Impression / Assessment and Plan /  ED Course  I have reviewed the triage vital signs and the nursing notes.  Pertinent labs & imaging results that were available during my care of the patient were reviewed by me and considered in my medical decision making (see chart for details).        Patient seen and examined.  Reviewed lab work with patient.  Awaiting UA and will obtain right upper quadrant ultrasound to evaluate for gallstones.  Anticipate discharged home with PPI, Carafate if negative with close PCP follow-up.  Vital signs reviewed and are as follows: BP 116/64 (BP Location: Left Arm)    Pulse 88    Temp 99.1 F (37.3 C) (Oral)  Resp 18    Ht 5\' 3"  (1.6 m)    Wt 99.8 kg    LMP 01/10/2019    SpO2 99%    BMI 38.97 kg/m   7:42 PM [place] neg. Pt ready for d/c.   The patient was urged to return to the Emergency Department immediately with worsening of current symptoms, worsening abdominal pain, persistent vomiting, blood noted in stools, fever, or any other concerns. The patient verbalized understanding.   Final Clinical Impressions(s) / ED Diagnoses   Final diagnoses:  Epigastric pain   Patient with abdominal pain. Vitals are stable, no fever. Labs reassuring. Imaging [place] negative. No signs of dehydration, patient is tolerating PO's. Lungs are clear and no signs suggestive of PNA. Low concern for appendicitis, cholecystitis, pancreatitis, ruptured viscus, UTI, kidney stone, aortic dissection, aortic aneurysm or other emergent abdominal etiology. Supportive therapy indicated with return if symptoms worsen.    ED Discharge Orders         Ordered    omeprazole (PRILOSEC) 20 MG capsule  Daily     01/13/19 1937    sucralfate (CARAFATE) 1 g tablet  3 times daily with meals & bedtime     01/13/19 1937           Carlisle Cater, Hershal Coria 01/13/19 1944    Mesner, Corene Cornea, MD 01/14/19 [phone]

## 2019-01-13 NOTE — Telephone Encounter (Signed)
See my chart message. Spoke to patient by telephone and was advised that her pain level now is about level 4. Patient stated that she has had a cough off and on for two weeks. Patient stated that she has not checked her temperature, but at the time of the call her temp was 97.3. Patient stated that she did take some Ibuprofen and did help the pain some. Patient stated that she did have a pain pill left over and she took that also.  Virtual  Visit scheduled with Dr. Lorelei Pont today at 3:40. Advised patient if her symptoms get worse before her virtual visit with Dr. Lorelei Pont to go to the ER and she verbalized understanding.

## 2019-01-13 NOTE — Telephone Encounter (Signed)
Arlinda is scheduled for Doxy[facility]Me appointment with Dr. Lorelei Pont today at 3:40 pm.

## 2019-01-14 ENCOUNTER — Telehealth: Payer: Self-pay

## 2019-01-14 NOTE — Telephone Encounter (Signed)
Attempted to reach patient to schedule ED f/u appt with PCP. Attempt unsuccessful. Left message with contact info.   If patient returns phone call, please schedule appt and route encounter note to PCP. Thank you.

## 2019-01-14 NOTE — Progress Notes (Signed)
This encounter was created in error - please disregard.

## 2019-01-15 ENCOUNTER — Telehealth: Payer: Self-pay | Admitting: Family Medicine

## 2019-01-15 NOTE — Telephone Encounter (Signed)
Patient called to schedule Hospital follow up for the 30th.  This has been scheduled virtual due to patient being near someone in the hospital who was suspected of having covid .   Just fyi

## 2019-01-16 NOTE — Telephone Encounter (Signed)
Patient returned phone call and scheduled telemedicine appt for 01/21/19.

## 2019-01-21 ENCOUNTER — Encounter: Payer: Self-pay | Admitting: Family Medicine

## 2019-01-21 ENCOUNTER — Ambulatory Visit (INDEPENDENT_AMBULATORY_CARE_PROVIDER_SITE_OTHER): Payer: BLUE CROSS/BLUE SHIELD | Admitting: Family Medicine

## 2019-01-21 VITALS — Ht 62.5 in

## 2019-01-21 DIAGNOSIS — R1013 Epigastric pain: Secondary | ICD-10-CM

## 2019-01-23 ENCOUNTER — Other Ambulatory Visit: Payer: Self-pay | Admitting: Family Medicine

## 2019-01-28 ENCOUNTER — Ambulatory Visit
Admission: RE | Admit: 2019-01-28 | Discharge: 2019-01-28 | Disposition: A | Discharge status: Home / Self Care | Payer: BLUE CROSS/BLUE SHIELD | Source: Ambulatory Visit | Attending: Family Medicine | Admitting: Family Medicine

## 2019-01-28 DIAGNOSIS — N83201 Unspecified ovarian cyst, right side: Secondary | ICD-10-CM

## 2019-03-26 ENCOUNTER — Encounter: Payer: Self-pay | Admitting: Family Medicine

## 2019-03-26 ENCOUNTER — Ambulatory Visit (INDEPENDENT_AMBULATORY_CARE_PROVIDER_SITE_OTHER): Payer: BLUE CROSS/BLUE SHIELD | Admitting: Family Medicine

## 2019-03-26 ENCOUNTER — Other Ambulatory Visit: Payer: Self-pay

## 2019-03-26 VITALS — BP 100/58 | HR 72 | Temp 98.6°F | Ht 62.5 in | Wt 218.0 lb

## 2019-03-26 DIAGNOSIS — R002 Palpitations: Secondary | ICD-10-CM | POA: Diagnosis not present

## 2019-03-26 NOTE — Patient Instructions (Signed)
Good to see you today  Your EKG is reassuring   I have ordered some blood work, please stop at the lab. I will notify you of results. Hydrate well, don't skip meals.   If palpitations continue, we can check a heart rate monitor.

## 2019-03-26 NOTE — Progress Notes (Signed)
   Subjective:    Patient ID: Tiffany Donovan, female    DOB: 17-Apr-1986, 33 y.o.   MRN: [id number]  HPI This is a 33 yo female who presents today with palpitations daily for about 10 days. Episodes feel like her heart is fluttering, lasting for about a minute. Has at least 2 episodes a day, sometimes much more frequent. No SOB. Can happen during any time, sometimes awakes with it but it does not awaken her from sleep. Feels like heaviness sometimes. Walking 2 miles a day, no increased episodes while exercising. No recent illness. No numnbness/tingling/headaches/weakness. Was drinking energy drinks and coffee but stopped over the last week without change in symptoms. Good fluid intake.  No family history of early heart disease. Not currently taking any medications.   Past Medical History:  Diagnosis Date  . Anxiety   . Depression   . Low back pain   . Migraine headache   . Premenstrual dysphoric disorder    No past surgical history on file. Family History  Problem Relation Age of Onset  . Diabetes Father   . Kidney disease Father   . Hypertension Father   . Migraines Sister   . Depression Mother   . Depression Brother   . Migraines Sister   . Coronary artery disease Paternal Grandmother   . Coronary artery disease Paternal Grandfather    Social History   Tobacco Use  . Smoking status: Never Smoker  . Smokeless tobacco: Never Used  Substance Use Topics  . Alcohol use: Yes    Alcohol/week: 0.0 standard drinks    Comment: occasionally  . Drug use: No      Review of Systems Per HPI    Objective:   Physical Exam Physical Exam  Constitutional: Oriented to person, place, and time. Sh appears well-developed and well-nourished.  HENT:  Head: Normocephalic and atraumatic.  Eyes: Conjunctivae are normal.  Neck: Normal range of motion. Neck supple.  Cardiovascular: Normal rate, regular rhythm and normal heart sounds.   Pulmonary/Chest: Effort normal and breath sounds normal.   Neurological: Alert and oriented to person, place, and time.  Skin: Skin is warm and dry.  Psychiatric: Normal mood and affect. Behavior is normal. Judgment and thought content normal.  Vitals reviewed.   BP (!) 100/58 (BP Location: Right Arm, Patient Position: Sitting, Cuff Size: Normal)   Pulse 72   Wt 218 lb (98.9 kg)   BMI 39.24 kg/m  Wt Readings from Last 3 Encounters:  03/26/19 218 lb (98.9 kg)  01/13/19 220 lb (99.8 kg)  12/03/18 221 lb 8 oz (100.5 kg)     EKG- NSR rate 68, PR 154, QT 404, no prior EKG for comparison  Assessment & Plan:  1. Palpitations -We will check labs, patient wishes to proceed with Holter monitor -Return to clinic/ER precautions reviewed - EKG 12-Lead - TSH - Basic Metabolic Panel - Holter monitor - 48 hour; Future   Clarene Reamer, FNP-BC  [facility], [facility]  03/27/2019 8:42 AM

## 2019-03-27 ENCOUNTER — Encounter: Payer: Self-pay | Admitting: Family Medicine

## 2019-03-27 ENCOUNTER — Telehealth: Payer: Self-pay | Admitting: Radiology

## 2019-03-27 LAB — BASIC METABOLIC PANEL
BUN: 16 mg/dL (ref 6–23)
CO2: 27 mEq/L (ref 19–32)
Calcium: 8.7 mg/dL (ref 8.4–10.5)
Chloride: 106 mEq/L (ref 96–112)
Creatinine, Ser: 0.59 mg/dL (ref 0.40–1.20)
GFR: 117.12 mL/min (ref >60.00)
Glucose, Bld: 83 mg/dL (ref 70–99)
Potassium: 3.7 mEq/L (ref 3.5–5.1)
Sodium: 140 mEq/L (ref 135–145)

## 2019-03-27 LAB — TSH: TSH: 1.14 u[IU]/mL (ref 0.35–4.50)

## 2019-03-27 NOTE — Telephone Encounter (Signed)
Enrolled patient for a 3 Day Zio to be mailed. Brief instructions were gone over with the patient and she knows to expect the monitor to arrive in 3-4 days.  48hr holter was changed to a 3 day Zio for mailing purposed during covid.

## 2019-04-02 ENCOUNTER — Ambulatory Visit (INDEPENDENT_AMBULATORY_CARE_PROVIDER_SITE_OTHER): Payer: BLUE CROSS/BLUE SHIELD

## 2019-04-02 DIAGNOSIS — R002 Palpitations: Secondary | ICD-10-CM

## 2019-04-04 ENCOUNTER — Emergency Department (HOSPITAL_COMMUNITY): Payer: BLUE CROSS/BLUE SHIELD

## 2019-04-04 ENCOUNTER — Encounter (HOSPITAL_COMMUNITY): Payer: Self-pay | Admitting: Emergency Medicine

## 2019-04-04 ENCOUNTER — Other Ambulatory Visit: Payer: Self-pay

## 2019-04-04 ENCOUNTER — Emergency Department (HOSPITAL_COMMUNITY)
Admission: EM | Admit: 2019-04-04 | Discharge: 2019-04-04 | Disposition: A | Discharge status: Home / Self Care | Payer: BLUE CROSS/BLUE SHIELD | Attending: Emergency Medicine | Admitting: Emergency Medicine

## 2019-04-04 DIAGNOSIS — R002 Palpitations: Secondary | ICD-10-CM | POA: Diagnosis not present

## 2019-04-04 DIAGNOSIS — Z79899 Other long term (current) drug therapy: Secondary | ICD-10-CM | POA: Insufficient documentation

## 2019-04-04 DIAGNOSIS — R0789 Other chest pain: Secondary | ICD-10-CM | POA: Diagnosis present

## 2019-04-04 LAB — BASIC METABOLIC PANEL
Anion gap: 8 (ref 5–15)
BUN: 14 mg/dL (ref 6–20)
CO2: 22 mmol/L (ref 22–32)
Calcium: 9.1 mg/dL (ref 8.9–10.3)
Chloride: 106 mmol/L (ref 98–111)
Creatinine, Ser: 0.65 mg/dL (ref 0.44–1.00)
GFR calc Af Amer: >60 mL/min (ref >60)
GFR calc non Af Amer: >60 mL/min (ref >60)
Glucose, Bld: 98 mg/dL (ref 70–99)
Potassium: 3.6 mmol/L (ref 3.5–5.1)
Sodium: 136 mmol/L (ref 135–145)

## 2019-04-04 LAB — TROPONIN I (HIGH SENSITIVITY)
Troponin I (High Sensitivity): 2 ng/L (ref <18)
Troponin I (High Sensitivity): <2 ng/L (ref <18)

## 2019-04-04 LAB — CBC
HCT: 40.1 % (ref 36.0–46.0)
Hemoglobin: 13.5 g/dL (ref 12.0–15.0)
MCH: 30.2 pg (ref 26.0–34.0)
MCHC: 33.7 g/dL (ref 30.0–36.0)
MCV: 89.7 fL (ref 80.0–100.0)
Platelets: 274 10*3/uL (ref 150–400)
RBC: 4.47 MIL/uL (ref 3.87–5.11)
RDW: 13 % (ref 11.5–15.5)
WBC: 5.1 10*3/uL (ref 4.0–10.5)
nRBC: 0 % (ref 0.0–0.2)

## 2019-04-04 LAB — I-STAT BETA HCG BLOOD, ED (MC, WL, AP ONLY): I-stat hCG, quantitative: <5 m[IU]/mL (ref <5)

## 2019-04-04 NOTE — Discharge Instructions (Signed)
Return if any problems.

## 2019-04-04 NOTE — ED Provider Notes (Signed)
[facility] EMERGENCY DEPARTMENT Provider Note   CSN: [id number] Arrival date & time: 04/04/19  0348     History   Chief Complaint Chief Complaint  Patient presents with  . Chest Pain    HPI Tiffany Donovan is a 33 y.o. female.     The history is provided by the patient. No language interpreter was used.  Palpitations Palpitations quality:  Irregular Onset quality:  Unable to specify Timing:  Constant Progression:  Worsening Chronicity:  New Context: anxiety and caffeine   Relieved by:  Nothing Worsened by:  Nothing Ineffective treatments:  None tried Associated symptoms: no dizziness   Risk factors: no heart disease   Pt reports she has episodes that feel like her heart is beating irregularly.  Pt's MD placed her on a holter monitor   Past Medical History:  Diagnosis Date  . Anxiety   . Depression   . Low back pain   . Migraine headache   . Premenstrual dysphoric disorder     Patient Active Problem List   Diagnosis Date Noted  . LLQ pain 11/28/2018  . Intractable chronic migraine without aura and without status migrainosus 12/29/2016  . Rebound headache 12/04/2016  . Low back pain 02/14/2016  . Chronic low back pain without sciatica 06/02/2015  . GAD (generalized anxiety disorder) 06/02/2015  . Moderate major depression ([place]) 04/02/2012  . Routine general medical examination at a health care facility 02/21/2011  . PMDD (premenstrual dysphoric disorder) 02/21/2011  . MORBID OBESITY 11/25/2008  . Migraine without aura 11/25/2008  . Allergic rhinitis due to allergen 11/25/2008    History reviewed. No pertinent surgical history.   OB History   No obstetric history on file.      Home Medications    Prior to Admission medications   Medication Sig Start Date End Date Taking? Authorizing Provider  omeprazole (PRILOSEC) 20 MG capsule Take 1 capsule (20 mg total) by mouth daily. 01/13/19   Carlisle Cater, PA-C    Family History  Family History  Problem Relation Age of Onset  . Diabetes Father   . Kidney disease Father   . Hypertension Father   . Migraines Sister   . Depression Mother   . Depression Brother   . Migraines Sister   . Coronary artery disease Paternal Grandmother   . Coronary artery disease Paternal Grandfather     Social History Social History   Tobacco Use  . Smoking status: Never Smoker  . Smokeless tobacco: Never Used  Substance Use Topics  . Alcohol use: Yes    Alcohol/week: 0.0 standard drinks    Comment: occasionally  . Drug use: No     Allergies   Patient has no known allergies.   Review of Systems Review of Systems  Cardiovascular: Positive for palpitations.  Neurological: Negative for dizziness.  All other systems reviewed and are negative.    Physical Exam Updated Vital Signs BP 124/86   Pulse (!) 50   Temp 98 F (36.7 C) (Oral)   Resp 16   LMP 03/26/2019 (Approximate)   SpO2 100%   Physical Exam Vitals signs and nursing note reviewed.  Constitutional:      Appearance: She is well-developed.  HENT:     Head: Normocephalic.  Eyes:     Pupils: Pupils are equal, round, and reactive to light.  Neck:     Musculoskeletal: Normal range of motion.  Cardiovascular:     Rate and Rhythm: Normal rate and regular rhythm.  Heart sounds: Normal heart sounds.  Pulmonary:     Effort: Pulmonary effort is normal.     Breath sounds: Normal breath sounds.  Abdominal:     General: There is no distension.  Musculoskeletal: Normal range of motion.  Skin:    General: Skin is warm.  Neurological:     General: No focal deficit present.     Mental Status: She is alert and oriented to person, place, and time.      ED Treatments / Results  Labs (all labs ordered are listed, but only abnormal results are displayed) Labs Reviewed  BASIC METABOLIC PANEL  CBC  I-STAT BETA HCG BLOOD, ED ([facility], [facility], [facility] ONLY)  TROPONIN I (HIGH SENSITIVITY)  TROPONIN I (HIGH SENSITIVITY)     EKG None  Radiology Dg Chest 2 View  Result Date: 04/04/2019 CLINICAL DATA:  Chest pain EXAM: CHEST - 2 VIEW COMPARISON:  None. FINDINGS: Normal heart size and mediastinal contours. No acute infiltrate or edema. No effusion or pneumothorax. No acute osseous findings. Holter monitor over the left chest. IMPRESSION: No active cardiopulmonary disease. Electronically Signed   By: Monte Fantasia M.D.   On: 04/04/2019 04:37    Procedures Procedures (including critical care time)  Medications Ordered in ED Medications - No data to display   Initial Impression / Assessment and Plan / ED Course  I have reviewed the triage vital signs and the nursing notes.  Pertinent labs & imaging results that were available during my care of the patient were reviewed by me and considered in my medical decision making (see chart for details).        MDM  EKG is normal, Troponin is negative x 2.  Pt monitored.  No abnormal beats,  Pt advised to continue holter monitor    Final Clinical Impressions(s) / ED Diagnoses   Final diagnoses:  Palpitations    ED Discharge Orders    None    An After Visit Summary was printed and given to the patient.    Fransico Meadow, [place] 04/04/19 1031    Lucrezia Starch, MD 04/07/19 [phone]

## 2019-04-04 NOTE — ED Triage Notes (Signed)
Patient reports intermittent central chest pain with SOB for 3 weeks , denies emesis or diaphoresis , she is wearing a portable cardiac monitor , denies cough or fever.

## 2019-04-17 ENCOUNTER — Encounter: Payer: Self-pay | Admitting: Family Medicine

## 2019-04-22 ENCOUNTER — Other Ambulatory Visit: Payer: Self-pay

## 2019-04-27 DIAGNOSIS — R002 Palpitations: Secondary | ICD-10-CM

## 2019-05-12 ENCOUNTER — Encounter: Payer: Self-pay | Admitting: Cardiology

## 2019-05-12 ENCOUNTER — Other Ambulatory Visit: Payer: Self-pay

## 2019-05-12 ENCOUNTER — Ambulatory Visit (INDEPENDENT_AMBULATORY_CARE_PROVIDER_SITE_OTHER): Payer: BLUE CROSS/BLUE SHIELD | Admitting: Cardiology

## 2019-05-12 VITALS — BP 119/44 | HR 95 | Temp 95.4°F | Ht 63.0 in | Wt 222.4 lb

## 2019-05-12 DIAGNOSIS — R002 Palpitations: Secondary | ICD-10-CM | POA: Diagnosis not present

## 2019-05-12 DIAGNOSIS — K219 Gastro-esophageal reflux disease without esophagitis: Secondary | ICD-10-CM | POA: Diagnosis not present

## 2019-05-12 DIAGNOSIS — Z6839 Body mass index (BMI) 39.0-39.9, adult: Secondary | ICD-10-CM | POA: Diagnosis not present

## 2019-05-12 DIAGNOSIS — E6609 Other obesity due to excess calories: Secondary | ICD-10-CM

## 2019-05-12 NOTE — Progress Notes (Signed)
Primary Physician/Referring:  Jinny Sanders, MD  Patient ID: Tiffany Donovan, female    DOB: [birth year], 33 y.o.   MRN: [id number]  Chief Complaint  Patient presents with  . New Patient (Initial Visit)  . Palpitations   HPI:    Tiffany Donovan  is a 33 y.o. Caucasian patient  with no significant prior cardiac history, seen in the emergency room on 04/04/2019 for palpitations ZIO Patch monitoring for 3 days which revealed occasional PACs.  Due to persistent symptoms she is now referred for further cardiac evaluation.  Patient's symptoms are mostly occurring during routine activities and at rest and symptoms started about 2 months ago.  Episodes are very short lasting for a few seconds.  She has been exercising regularly and has not had any chest pain, dyspnea or palpitation during physical exertion activities.  There is no history of premature coronary artery disease or sudden cardiac death in the family.  Past Medical History:  Diagnosis Date  . Anxiety   . Depression   . Low back pain   . Migraine headache   . Premenstrual dysphoric disorder    Past Surgical History:  Procedure Laterality Date  . None     Social History   Socioeconomic History  . Marital status: Single    Spouse name: Not on file  . Number of children: 2  . Years of education: Not on file  . Highest education level: Not on file  Occupational History  . Occupation: Research scientist (physical sciences): [employer]  Social Needs  . Financial resource strain: Not on file  . Food insecurity    Worry: Not on file    Inability: Not on file  . Transportation needs    Medical: Not on file    Non-medical: Not on file  Tobacco Use  . Smoking status: Never Smoker  . Smokeless tobacco: Never Used  Substance and Sexual Activity  . Alcohol use: Yes    Alcohol/week: 0.0 standard drinks    Comment: occasionally  . Drug use: No  . Sexual activity: Yes    Partners: Female  Lifestyle  . Physical activity    Days per  week: Not on file    Minutes per session: Not on file  . Stress: Not on file  Relationships  . Social Herbalist on phone: Not on file    Gets together: Not on file    Attends religious service: Not on file    Active member of club or organization: Not on file    Attends meetings of clubs or organizations: Not on file    Relationship status: Not on file  . Intimate partner violence    Fear of current or ex partner: Not on file    Emotionally abused: Not on file    Physically abused: Not on file    Forced sexual activity: Not on file  Other Topics Concern  . Not on file  Social History Narrative   Regular exercise- no      Diet: fruits and veggies..but, just eats 1 meal a day   ROS  Review of Systems  Constitution: Negative for chills, decreased appetite, malaise/fatigue and weight gain.  Cardiovascular: Positive for palpitations. Negative for dyspnea on exertion, leg swelling and syncope.  Endocrine: Negative for cold intolerance.  Hematologic/Lymphatic: Does not bruise/bleed easily.  Musculoskeletal: Negative for joint swelling.  Gastrointestinal: Negative for abdominal pain, anorexia, change in bowel habit, hematochezia and melena.  Neurological: Negative for headaches and light-headedness.  Psychiatric/Behavioral: Negative for depression and substance abuse.  All other systems reviewed and are negative.  Objective   Vitals with BMI 05/12/2019 04/04/2019 04/04/2019  Height 5\' 3"  - -  Weight 222 lbs 6 oz - -  BMI 0000000 - -  Systolic 123456 [id number] A999333  Diastolic 44 82 82  Pulse 95 59 59    Blood pressure (!) 119/44, pulse 95, temperature (!) 95.4 F (35.2 C), height 5\' 3"  (1.6 m), weight 222 lb 6.4 oz (100.9 kg), SpO2 97 %. Body mass index is 39.4 kg/m.   Physical Exam  Constitutional:  She is moderately built and nearly morbidly obese in no acute distress.  HENT:  Head: Atraumatic.  Eyes: Conjunctivae are normal.  Neck: Neck supple. No JVD present. No  thyromegaly present.  Cardiovascular: Normal rate, regular rhythm, normal heart sounds and intact distal pulses. Exam reveals no gallop.  No murmur heard. No leg edema, no JVD.  Pulmonary/Chest: Effort normal and breath sounds normal.  Abdominal: Soft. Bowel sounds are normal.  Musculoskeletal: Normal range of motion.  Neurological: She is alert.  Skin: Skin is warm and dry.  Psychiatric: She has a normal mood and affect.   Radiology: No results found.  Laboratory examination:   Recent Labs    01/13/19 1530 03/26/19 1453 04/04/19 0411  NA 138 140 136  K 4.3 3.7 3.6  CL 107 106 106  CO2 24 27 22   GLUCOSE 96 83 98  BUN 11 16 14   CREATININE 0.59 0.59 0.65  CALCIUM 8.8* 8.7 9.1  GFRNONAA >60  --  >60  GFRAA >60  --  >60   CMP Latest Ref Rng & Units 04/04/2019 03/26/2019 01/13/2019  Glucose 70 - 99 mg/dL 98 83 96  BUN 6 - 20 mg/dL 14 16 11   Creatinine 0.44 - 1.00 mg/dL 0.65 0.59 0.59  Sodium 135 - 145 mmol/L 136 140 138  Potassium 3.5 - 5.1 mmol/L 3.6 3.7 4.3  Chloride 98 - 111 mmol/L 106 106 107  CO2 22 - 32 mmol/L 22 27 24   Calcium 8.9 - 10.3 mg/dL 9.1 8.7 8.8(L)  Total Protein 6.5 - 8.1 g/dL - - 7.0  Total Bilirubin 0.3 - 1.2 mg/dL - - 0.5  Alkaline Phos 38 - 126 U/L - - 51  AST 15 - 41 U/L - - 19  ALT 0 - 44 U/L - - 21   CBC Latest Ref Rng & Units 04/04/2019 01/13/2019 11/28/2018  WBC 4.0 - 10.5 K/uL 5.1 5.7 5.5  Hemoglobin 12.0 - 15.0 g/dL 13.5 12.8 13.5  Hematocrit 36.0 - 46.0 % 40.1 38.6 39.6  Platelets 150 - 400 K/uL 274 287 301.0   Lipid Panel     Component Value Date/Time   CHOL 169 08/17/2016 1405   TRIG 47 08/17/2016 1405   HDL 80 08/17/2016 1405   CHOLHDL 2.1 08/17/2016 1405   CHOLHDL 2 04/02/2012 0917   VLDL 6.8 04/02/2012 0917   LDLCALC 80 08/17/2016 1405   HEMOGLOBIN A1C No results found for: HGBA1C, MPG TSH Recent Labs    03/26/19 1453  TSH 1.14   Medications and allergies  No Known Allergies   Prior to Admission medications   Medication  Sig Start Date End Date Taking? Authorizing Provider  omeprazole (PRILOSEC) 20 MG capsule Take 1 capsule (20 mg total) by mouth daily. 01/13/19   Carlisle Cater, PA-C     Current Outpatient Medications  Medication Instructions  . cetirizine (ZYRTEC)  10 mg, Oral, As needed  . esomeprazole (NEXIUM) 20 mg, Oral, As needed   Cardiac Studies:   Zio patch 04/02/2019: 3 days: Minimum heart rate 48 bpm, maximum heart rate 137 bpm.  Predominant sinus rhythm.  Rare PACs and PVCs.  There were 18 patient diary events which did not reveal any significant arrhythmia.,  Occasional correlation with PACs and PVCs.  Assessment     ICD-10-CM   1. Palpitations  R00.2 EKG 12-Lead  2. Gastroesophageal reflux disease without esophagitis  K21.9   3. Class 2 obesity due to excess calories without serious comorbidity with body mass index (BMI) of 39.0 to 39.9 in adult  E66.09    Z68.39     EKG 05/12/2019: Normal sinus rhythm at rate of 62 bpm, normal axis.  No evidence of ischemia, normal EKG.  Recommendations:   I reviewed the results of the  Zio patch that was done recently and showed her the episodes of palpitations that she had correlated very well with PACs, atrial bigeminy short 2-3 beat, occasional PVC.  I discussed with her that these are life altering in the form of symptoms but not life-threatening.  Otherwise her physical exam except for obesity is unremarkable with normal cardiac sounds and vascular exam.  EKG is also otherwise normal.  Contributing factors to palpitations including GERD, obesity, stress, hormonal changes, alcohol and caffeine we discussed.  I discussed with her regarding weight loss.  She is now started the new diet and hopefully this will help.  Advised her to continue with PPI for now.  I reviewed her labs including TSH, lipids and CBC all within normal limits.  Adrian Prows, MD, [facility] 05/12/2019, 2:06 PM [facility]. [place] Pager: [phone] Office: [phone] If no  answer Cell [phone]

## 2019-07-14 ENCOUNTER — Other Ambulatory Visit: Payer: Self-pay

## 2019-07-14 ENCOUNTER — Other Ambulatory Visit (INDEPENDENT_AMBULATORY_CARE_PROVIDER_SITE_OTHER): Payer: BLUE CROSS/BLUE SHIELD

## 2019-07-14 ENCOUNTER — Telehealth: Payer: Self-pay | Admitting: Family Medicine

## 2019-07-14 DIAGNOSIS — Z13220 Encounter for screening for lipoid disorders: Secondary | ICD-10-CM

## 2019-07-14 LAB — COMPREHENSIVE METABOLIC PANEL
ALT: 10 U/L (ref 0–35)
AST: 12 U/L (ref 0–37)
Albumin: 4 g/dL (ref 3.5–5.2)
Alkaline Phosphatase: 41 U/L (ref 39–117)
BUN: 15 mg/dL (ref 6–23)
CO2: 28 mEq/L (ref 19–32)
Calcium: 9.2 mg/dL (ref 8.4–10.5)
Chloride: 105 mEq/L (ref 96–112)
Creatinine, Ser: 0.57 mg/dL (ref 0.40–1.20)
GFR: 121.66 mL/min (ref >60.00)
Glucose, Bld: 86 mg/dL (ref 70–99)
Potassium: 4.6 mEq/L (ref 3.5–5.1)
Sodium: 140 mEq/L (ref 135–145)
Total Bilirubin: 0.4 mg/dL (ref 0.2–1.2)
Total Protein: 6.3 g/dL (ref 6.0–8.3)

## 2019-07-14 LAB — LIPID PANEL
Cholesterol: 175 mg/dL (ref 0–200)
HDL: 66.4 mg/dL (ref >39.00)
LDL Cholesterol: 102 mg/dL — ABNORMAL HIGH (ref 0–99)
NonHDL: 109.06
Total CHOL/HDL Ratio: 3
Triglycerides: 37 mg/dL (ref 0.0–149.0)
VLDL: 7.4 mg/dL (ref 0.0–40.0)

## 2019-07-14 NOTE — Telephone Encounter (Signed)
-----   Message from Ellamae Sia sent at 07/08/2019  2:40 PM EST ----- Regarding: Lab orders for Monday, 12.21.20 Patient is scheduled for CPX labs, please order future labs, Thanks , Karna Christmas

## 2019-07-16 MED ORDER — PANTOPRAZOLE SODIUM 40 MG PO TBEC: 40.0000 mg | DELAYED_RELEASE_TABLET | Freq: Every day | ORAL | 3 refills | Status: DC

## 2019-07-16 NOTE — Addendum Note (Signed)
Addended by: Kris Mouton on: 07/16/2019 12:05 PM   Modules accepted: Orders

## 2019-07-16 NOTE — Progress Notes (Signed)
No critical labs need to be addressed urgently. We will discuss labs in detail at upcoming office visit.   

## 2019-07-29 ENCOUNTER — Encounter: Payer: Self-pay | Admitting: Family Medicine

## 2019-07-29 ENCOUNTER — Ambulatory Visit (INDEPENDENT_AMBULATORY_CARE_PROVIDER_SITE_OTHER): Payer: BLUE CROSS/BLUE SHIELD | Admitting: Family Medicine

## 2019-07-29 ENCOUNTER — Other Ambulatory Visit: Payer: Self-pay

## 2019-07-29 VITALS — BP 92/60 | HR 74 | Temp 98.3°F | Ht 62.25 in | Wt 216.8 lb

## 2019-07-29 DIAGNOSIS — M545 Low back pain, unspecified: Secondary | ICD-10-CM

## 2019-07-29 DIAGNOSIS — F321 Major depressive disorder, single episode, moderate: Secondary | ICD-10-CM

## 2019-07-29 DIAGNOSIS — Z0000 Encounter for general adult medical examination without abnormal findings: Secondary | ICD-10-CM

## 2019-07-29 DIAGNOSIS — G8929 Other chronic pain: Secondary | ICD-10-CM

## 2019-07-29 NOTE — Progress Notes (Signed)
Chief Complaint  Patient presents with  . Annual Exam    History of Present Illness: HPI   The patient is here for annual wellness exam and preventative care.     GAD, MDD: improved control on no medicaiton. Working on stress reduction and relaxation. PHQ9 3 today   Allergic rhinitis:  Stable control on zyrtec as needed  Obesity with associated low back pain  Body mass index is 39.33 kg/m.   Diet: improved. Keto diet  Exercise: 4 times a week. Wt Readings from Last 3 Encounters:  07/29/19 216 lb 12 oz (98.3 kg)  05/12/19 222 lb 6.4 oz (100.9 kg)  03/26/19 218 lb (98.9 kg)      This visit occurred during the SARS-CoV-2 public health emergency.  Safety protocols were in place, including screening questions prior to the visit, additional usage of staff PPE, and extensive cleaning of exam room while observing appropriate contact time as indicated for disinfecting solutions.   COVID 19 screen:  No recent travel or known exposure to COVID19 The patient denies respiratory symptoms of COVID 19 at this time. The importance of social distancing was discussed today.     Review of Systems  Constitutional: Negative for chills and fever.  HENT: Negative for congestion and ear pain.   Eyes: Negative for pain and redness.  Respiratory: Negative for cough and shortness of breath.   Cardiovascular: Negative for chest pain, palpitations and leg swelling.  Gastrointestinal: Negative for abdominal pain, blood in stool, constipation, diarrhea, nausea and vomiting.  Genitourinary: Negative for dysuria.  Musculoskeletal: Negative for falls and myalgias.  Skin: Negative for rash.  Neurological: Negative for dizziness.  Psychiatric/Behavioral: Negative for depression. The patient is not nervous/anxious.       Past Medical History:  Diagnosis Date  . Anxiety   . Depression   . Low back pain   . Migraine headache   . Premenstrual dysphoric disorder     reports that she has never  smoked. She has never used smokeless tobacco. She reports current alcohol use. She reports that she does not use drugs.   Current Outpatient Medications:  .  pantoprazole (PROTONIX) 40 MG tablet, Take 1 tablet (40 mg total) by mouth daily., Disp: 30 tablet, Rfl: 3 No current facility-administered medications for this visit.  Facility-Administered Medications Ordered in Other Visits:  .  gadopentetate dimeglumine (MAGNEVIST) injection 20 mL, 20 mL, Intravenous, Once PRN, Melvenia Beam, MD   Observations/Objective: Blood pressure 92/60, pulse 74, temperature 98.3 F (36.8 C), temperature source Temporal, height 5' 2.25" (1.581 m), weight 216 lb 12 oz (98.3 kg), last menstrual period 07/10/2019, SpO2 97 %.  Physical Exam Constitutional:      General: She is not in acute distress.    Appearance: Normal appearance. She is well-developed. She is not ill-appearing or toxic-appearing.  HENT:     Head: Normocephalic.     Right Ear: Hearing, tympanic membrane, ear canal and external ear normal.     Left Ear: Hearing, tympanic membrane, ear canal and external ear normal.     Nose: Nose normal.  Eyes:     General: Lids are normal. Lids are everted, no foreign bodies appreciated.     Conjunctiva/sclera: Conjunctivae normal.     Pupils: Pupils are equal, round, and reactive to light.  Neck:     Thyroid: No thyroid mass or thyromegaly.     Vascular: No carotid bruit.     Trachea: Trachea normal.  Cardiovascular:     Rate  and Rhythm: Normal rate and regular rhythm.     Heart sounds: Normal heart sounds, S1 normal and S2 normal. No murmur. No gallop.   Pulmonary:     Effort: Pulmonary effort is normal. No respiratory distress.     Breath sounds: Normal breath sounds. No wheezing, rhonchi or rales.  Abdominal:     General: Bowel sounds are normal. There is no distension or abdominal bruit.     Palpations: Abdomen is soft. There is no fluid wave or mass.     Tenderness: There is no abdominal  tenderness. There is no guarding or rebound.     Hernia: No hernia is present.  Musculoskeletal:     Cervical back: Normal range of motion and neck supple.  Lymphadenopathy:     Cervical: No cervical adenopathy.  Skin:    General: Skin is warm and dry.     Findings: No rash.  Neurological:     Mental Status: She is alert.     Cranial Nerves: No cranial nerve deficit.     Sensory: No sensory deficit.  Psychiatric:        Mood and Affect: Mood is not anxious or depressed.        Speech: Speech normal.        Behavior: Behavior normal. Behavior is cooperative.        Judgment: Judgment normal.      Assessment and Plan    The patient's preventative maintenance and recommended screening tests for an annual wellness exam were reviewed in full today. Brought up to date unless services declined.  Counselled on the importance of diet, exercise, and its role in overall health and mortality. The patient's FH and SH was reviewed, including their home life, tobacco status, and drug and alcohol status.   Vaccines: refused flu, uptodate with td Pap/DVE:  2018 normal pap with no HPV Mammo: no early family history of breast cancer Colon:  No early family history CRC Smoking Status: none ETOH/ drug use: 2 times a week/ none  HIV screen:   done  Moderate major depression  Resolved off medication and stress reduction.  MORBID OBESITY Encouraged exercise, weight loss, healthy eating habits.   Chronic low back pain without sciatica  Start back home PT. Follow up if not improving.    Eliezer Lofts, MD

## 2019-07-29 NOTE — Assessment & Plan Note (Signed)
Encouraged exercise, weight loss, healthy eating habits. ? ?

## 2019-07-29 NOTE — Assessment & Plan Note (Signed)
Start back home PT. Follow up if not improving.

## 2019-07-29 NOTE — Assessment & Plan Note (Addendum)
Resolved off medication and stress reduction.

## 2019-07-29 NOTE — Patient Instructions (Signed)
Start back hpome PT. Follow up if not improving.  Continue working on exercise, weight loss, healthy eating habits.

## 2019-09-08 ENCOUNTER — Encounter: Payer: Self-pay | Admitting: Primary Care

## 2019-09-08 ENCOUNTER — Ambulatory Visit (INDEPENDENT_AMBULATORY_CARE_PROVIDER_SITE_OTHER): Payer: BLUE CROSS/BLUE SHIELD | Admitting: Primary Care

## 2019-09-08 ENCOUNTER — Telehealth: Payer: Self-pay

## 2019-09-08 ENCOUNTER — Ambulatory Visit: Payer: BLUE CROSS/BLUE SHIELD | Attending: Internal Medicine

## 2019-09-08 DIAGNOSIS — J069 Acute upper respiratory infection, unspecified: Secondary | ICD-10-CM | POA: Diagnosis not present

## 2019-09-08 DIAGNOSIS — Z20822 Contact with and (suspected) exposure to covid-19: Secondary | ICD-10-CM

## 2019-09-08 NOTE — Progress Notes (Signed)
   Subjective:    Patient ID: [name], female    DOB: 09-Dec-1985, 34 y.o.   MRN: [id number]  HPI  Virtual Visit via Video Note  I connected with [name] on 09/08/19 at 11:40 AM EST by a video enabled telemedicine application and verified that I am speaking with the correct person using two identifiers.  Location: Patient: Home Provider: Office   I discussed the limitations of evaluation and management by telemedicine and the availability of in person appointments. The patient expressed understanding and agreed to proceed.  History of Present Illness:  Ms. Obara is a 34 year old female with a history of migraines, allergic rhinitis who presents today with a chief complaint of fever.  She also reports chills, body aches, sore throat, dry cough, nasal congestion, headache. Her symptoms began suddenly three days ago. She had a rapid Covid-19 test two days ago which was negative.   She's taken Ibuprofen with improvement in fevers. Her fevers are ranging 99-102, spiking up and down. She denies known exposure to Covid-19, works from home. She did not have an influenza vaccination this season.   She denies loss of taste or smell, diarrhea.    Observations/Objective:  Alert and oriented. Appears ill. No distress. Speaking in complete sentences. No cough.  Assessment and Plan:  Viral etiology suspected, especially given symptoms and sudden onset. Could be influenza as she's not had vaccine this season. Need to rule out Covid-19, rapid test was negative but could be false negative as she had this done within 24 hour of symptom onset.  Switch to Tylenol for headaches/body aches/fevers.  Information sent for Covid-19 testing through [facility].  Follow Up Instructions: Schedule a Covid-19 test as discussed.  Covid-19 testing is by appointment only at this time.   You can schedule an appointment by going online to [email or web address], texting COVID to [phone], or  by calling [phone].  Testing sites include:  [facility] ([address], [place], [place]) [facility] in [place], [place] to Tylenol for fevers, body aches, headaches.   Remain home until have your test results. I'll post a work note to your account.  It was a pleasure meeting you! Allie Bossier, NP-C    I discussed the assessment and treatment plan with the patient. The patient was provided an opportunity to ask questions and all were answered. The patient agreed with the plan and demonstrated an understanding of the instructions.   The patient was advised to call back or seek an in-person evaluation if the symptoms worsen or if the condition fails to improve as anticipated.     Pleas Koch, NP    Review of Systems  Constitutional: Positive for chills, fatigue and fever.  HENT: Positive for congestion and sore throat.   Respiratory: Positive for cough.   Musculoskeletal: Positive for myalgias.  Neurological: Positive for headaches.       Objective:   Physical Exam         Assessment & Plan:

## 2019-09-08 NOTE — Patient Instructions (Signed)
Schedule a Covid-19 test as discussed.  Covid-19 testing is by appointment only at this time.   You can schedule an appointment by going online to [email or web address], texting COVID to [phone], or by calling [phone].  Testing sites include:  [facility] ([address] [place], [place]) [facility] in [place], [place] to Tylenol for fevers, body aches, headaches.   Remain home until have your test results. I'll post a work note to your account.  It was a pleasure meeting you! Allie Bossier, NP-C

## 2019-09-08 NOTE — Telephone Encounter (Signed)
Patient evaluated.  

## 2019-09-08 NOTE — Assessment & Plan Note (Signed)
Viral etiology suspected, especially given symptoms and sudden onset. Could be influenza as she's not had vaccine this season. Need to rule out Covid-19, rapid test was negative but could be false negative as she had this done within 24 hour of symptom onset.  Switch to Tylenol for headaches/body aches/fevers.  Information sent for Covid-19 testing through [facility].

## 2019-09-08 NOTE — Telephone Encounter (Signed)
Pt left v/m; starting on 09/05/19 fever, pt has been taking ibuprofen and this morning T 99.1,chills, body aches,S/T, and dry cough; pt also has muscle and joint pain in back, arms and legs,H/A pain level is 7,generalized weakness; no other covid symtoms;no travel and no exposure to + covid virus and on 09/06/19 pt had rapid covid test at [facility] [place] which was neg.pt was advised if symptoms continue might possibly need to be retested for covid. Symptoms continue. Pt scheduled virtual appt 09/08/19 at 11:40 with Gentry Fitz NP. UC & ED precautions given and pt voiced understanding. Pt will have vitals ready.

## 2019-09-09 LAB — NOVEL CORONAVIRUS, NAA: SARS-CoV-2, NAA: NOT DETECTED

## 2019-10-08 ENCOUNTER — Ambulatory Visit: Payer: BLUE CROSS/BLUE SHIELD | Admitting: Cardiology

## 2019-10-24 ENCOUNTER — Other Ambulatory Visit: Payer: Self-pay | Admitting: Family Medicine

## 2019-10-27 ENCOUNTER — Telehealth: Payer: Self-pay

## 2019-10-27 NOTE — Telephone Encounter (Signed)
Received [facility] from [facility] stating Pantoprazole 40 mg is not covered.  Please send alternative as appropriate.

## 2019-10-27 NOTE — Telephone Encounter (Signed)
[facility] Night - Client TELEPHONE ADVICE RECORD [facility] Patient Name: Tiffany Donovan Gender: Unknown DOB: [birth year] Age: 34 Y 11 M 12 D Return Phone Number: [phone] (Primary) Address: City/State/Zip: [place] [address] Client [facility] Night - Client Client Site [place] Physician Eliezer Lofts - MD Contact Type Call Who Is Calling Patient / Member / Family / Caregiver Call Type Triage / Clinical Relationship To Patient Self Return Phone Number [phone] (Primary) Chief Complaint CHEST PAIN (>=21 years) - pain, pressure, heaviness or tightness Reason for Call Symptomatic / Request for [place] is having heart palpitations and sharp pains. Translation No Nurse Assessment Nurse: Mancel Bale, RN, Butch Penny Date/Time Eilene Ghazi Time): 10/25/2019 12:38:35 PM Confirm and document reason for call. If symptomatic, describe symptoms. ---Caller states she is having heart palpitations and chest pains. She states she has had heart palpitations but is not under treatment and her chest pain is a new symptoms. Denies shortness of breath. Has the patient had close contact with a person known or suspected to have the novel coronavirus illness OR traveled / lives in area with major community spread (including international travel) in the last 14 days from the onset of symptoms? * If Asymptomatic, screen for exposure and travel within the last 14 days. ---No Does the patient have any new or worsening symptoms? ---Yes Will a triage be completed? ---Yes Related visit to physician within the last 2 weeks? ---No Does the PT have any chronic conditions? (i.e. diabetes, asthma, this includes High risk factors for pregnancy, etc.) ---Yes List chronic conditions. ---GERD Is the patient pregnant or possibly pregnant? (Ask all females between the ages of 4-55) ---No Is this a  behavioral health or substance abuse call? ---No Guidelines Guideline Title Affirmed Question Affirmed Notes Nurse Date/Time (Eastern Time) Chest Pain [1] Chest pain lasts > 5 minutes AND [2] Sheran Fava 10/25/2019 12:40:37 PM PLEASE NOTE: All timestamps contained within this report are represented as [place] Standard Time. CONFIDENTIALTY NOTICE: This fax transmission is intended only for the addressee. It contains information that is legally privileged, confidential or otherwise protected from use or disclosure. If you are not the intended recipient, you are strictly prohibited from reviewing, disclosing, copying using or disseminating any of this information or taking any action in reliance on or regarding this information. If you have received this fax in error, please notify us immediately by telephone so that we can arrange for its return to [place]. Phone: [phone], Toll-Free: [phone], Fax: [phone] Page: 2 of 2 Call Id: [id number] Guidelines Guideline Title Affirmed Question Affirmed Notes Nurse Date/Time Eilene Ghazi Time) described as crushing, pressure-like, or heavy Disp. Time Eilene Ghazi Time) Disposition Final User 10/25/2019 12:37:20 PM Send to Urgent Wynona Dove 10/25/2019 12:50:39 PM 911 Outcome Documentation Mancel Bale RN, Butch Penny Reason: 911 Dispo Call back: Caller stated she decided to go to ED in her own private vehicle. 10/25/2019 12:45:19 PM Call EMS 911 Now Yes Mancel Bale, RN, [place] Disagree/Comply Comply Caller Understands Yes PreDisposition InappropriateToAsk Care Advice Given Per Guideline CALL EMS 911 NOW: * Immediate medical attention is needed. You need to hang up and call 911 (or an ambulance). * Triager Discretion: I'll call you back in a few minutes to be sure you were able to reach them. Comments User: Marijo Conception, RN Date/Time Eilene Ghazi Time): 10/25/2019 12:51:00 PM Caller stated she will have her sister drive her to the  ED. Referrals [facility] -  ED

## 2019-10-27 NOTE — Telephone Encounter (Signed)
I spoke with pt and she did not go to ED on 10/25/19; pt rested and then sharp chest pain went away after 1 hr. Pt did not have any further pains and pt declines an appt at [facility]. Pt will cb if needed and appreciative for FU.

## 2019-10-28 MED ORDER — OMEPRAZOLE 40 MG PO CPDR: 40.0000 mg | DELAYED_RELEASE_CAPSULE | Freq: Every day | ORAL | 3 refills | Status: DC

## 2019-10-28 NOTE — Telephone Encounter (Signed)
Call: I have sent in omeprazole 40 ,mg daily.. but if not covered or she has tried without success before let me know

## 2019-10-29 NOTE — Telephone Encounter (Addendum)
Left message for Tiffany Donovan that her insurance would not cover the Nexium so Dr. Diona Browner sent in Omeprazole 40 mg.  Her insurance did require a PA for the omeprazole but that was approved, from 10/29/2019-10/28/2022, so she should be able [facility].  I ask that she call me back if she has any questions.

## 2019-10-29 NOTE — Telephone Encounter (Signed)
[facility] requesting [facility] for Omeprazole 40 mg.  PA completed on CoverMyMeds and approved.  [facility] notified of approval via fax.

## 2020-01-15 NOTE — Telephone Encounter (Signed)
Forwarded message

## 2020-01-23 ENCOUNTER — Other Ambulatory Visit: Payer: Self-pay | Admitting: Family Medicine

## 2020-02-09 ENCOUNTER — Ambulatory Visit: Payer: BLUE CROSS/BLUE SHIELD | Admitting: Cardiology

## 2020-04-05 ENCOUNTER — Other Ambulatory Visit: Payer: BLUE CROSS/BLUE SHIELD

## 2020-04-05 ENCOUNTER — Other Ambulatory Visit: Payer: Self-pay

## 2020-04-05 DIAGNOSIS — Z20822 Contact with and (suspected) exposure to covid-19: Secondary | ICD-10-CM

## 2020-04-06 LAB — SARS-COV-2, NAA 2 DAY TAT

## 2020-04-06 LAB — NOVEL CORONAVIRUS, NAA: SARS-CoV-2, NAA: DETECTED — AB

## 2020-05-31 MED ORDER — TOPIRAMATE 25 MG PO TABS
ORAL_TABLET | ORAL | 1 refills | Status: DC
Start: 2020-05-31 — End: 2020-07-26

## 2020-05-31 MED ORDER — SUMATRIPTAN SUCCINATE 100 MG PO TABS: 100.0000 mg | ORAL_TABLET | ORAL | 0 refills | Status: DC | PRN

## 2020-06-16 ENCOUNTER — Ambulatory Visit: Payer: BLUE CROSS/BLUE SHIELD | Admitting: Cardiology

## 2020-06-18 ENCOUNTER — Other Ambulatory Visit: Payer: Self-pay | Admitting: Family Medicine

## 2020-06-22 ENCOUNTER — Other Ambulatory Visit: Payer: Self-pay | Admitting: Family Medicine

## 2020-06-23 NOTE — Telephone Encounter (Signed)
Received paperwork for FMLA. Filled and given to Butch Penny to place in Dr.Bedsole's inbox.

## 2020-06-25 DIAGNOSIS — Z0279 Encounter for issue of other medical certificate: Secondary | ICD-10-CM

## 2020-06-25 NOTE — Telephone Encounter (Signed)
Received paperwork back. Notified patient paperwork is ready for pick up and unable to fax due to no fax number for employer. Will have wife come pick up. Pt expressed understanding.   Copy for billing Copy for scan Copy for patient

## 2020-07-01 ENCOUNTER — Other Ambulatory Visit: Payer: Self-pay

## 2020-07-01 ENCOUNTER — Ambulatory Visit: Payer: BLUE CROSS/BLUE SHIELD | Admitting: Cardiology

## 2020-07-01 ENCOUNTER — Encounter: Payer: Self-pay | Admitting: Cardiology

## 2020-07-01 VITALS — BP 110/68 | HR 71 | Resp 16 | Ht 62.0 in | Wt 219.0 lb

## 2020-07-01 DIAGNOSIS — G43109 Migraine with aura, not intractable, without status migrainosus: Secondary | ICD-10-CM

## 2020-07-01 DIAGNOSIS — Z6841 Body Mass Index (BMI) 40.0 and over, adult: Secondary | ICD-10-CM

## 2020-07-01 DIAGNOSIS — R002 Palpitations: Secondary | ICD-10-CM

## 2020-07-01 DIAGNOSIS — F418 Other specified anxiety disorders: Secondary | ICD-10-CM

## 2020-07-01 MED ORDER — BUPROPION HCL ER (SR) 150 MG PO TB12: 150.0000 mg | ORAL_TABLET | Freq: Two times a day (BID) | ORAL | 2 refills | Status: DC

## 2020-07-01 MED ORDER — VERAPAMIL HCL ER 180 MG PO TBCR: 180.0000 mg | EXTENDED_RELEASE_TABLET | ORAL | 2 refills | Status: DC

## 2020-07-01 NOTE — Progress Notes (Signed)
Primary Physician/Referring:  Jinny Sanders, MD  Patient ID: Tiffany Donovan, female    DOB: 1986/06/24, 34 y.o.   MRN: [id number]  Chief Complaint  Patient presents with  . Palpitations   HPI:    Tiffany Donovan  is a 34 y.o. Caucasian patient  with no significant prior cardiac history, seen in the emergency room on 04/04/2019 for palpitations ZIO Patch monitoring for 3 days which revealed occasional PACs.  Due to persistent symptoms she is now referred for further cardiac evaluation.  Patient's symptoms are mostly occurring during routine activities and at rest and symptoms started about 2 months ago.  Episodes are very short lasting for a few seconds.  She has been exercising regularly and has not had any chest pain, dyspnea or palpitation during physical exertion activities.  There is no history of premature coronary artery disease or sudden cardiac death in the family.  Past Medical History:  Diagnosis Date  . Anxiety   . Depression   . Low back pain   . Migraine headache   . Premenstrual dysphoric disorder    Past Surgical History:  Procedure Laterality Date  . None      Social History   Tobacco Use  . Smoking status: Never Smoker  . Smokeless tobacco: Never Used  Substance Use Topics  . Alcohol use: Yes    Alcohol/week: 0.0 standard drinks    Comment: occasionally  Marital Status: Married     ROS  Review of Systems  Cardiovascular: Positive for palpitations. Negative for chest pain, dyspnea on exertion and leg swelling.  Gastrointestinal: Negative for melena.  Neurological: Positive for headaches.  Psychiatric/Behavioral: Positive for depression. The patient is nervous/anxious.    Objective   Vitals with BMI 07/01/2020 09/08/2019 07/29/2019  Height 5\' 2"  - 5' 2.25"  Weight 219 lbs 216 lbs 216 lbs 12 oz  BMI 00.37 - 04.88  Systolic 891 - 92  Diastolic 68 - 60  Pulse 71 - 74    Blood pressure 110/68, pulse 71, resp. rate 16, height 5\' 2"  (1.575 m), weight  219 lb (99.3 kg), SpO2 100 %. Body mass index is 40.06 kg/m.   Physical Exam Constitutional:      Appearance: She is obese.     Comments: She is moderately built, morbidly obese in no acute distress.  HENT:     Head: Atraumatic.  Eyes:     Conjunctiva/sclera: Conjunctivae normal.  Neck:     Thyroid: No thyromegaly.     Vascular: No JVD.  Cardiovascular:     Rate and Rhythm: Normal rate and regular rhythm.     Pulses: Intact distal pulses.     Heart sounds: Normal heart sounds. No murmur heard. No gallop.      Comments: No leg edema, no JVD. Pulmonary:     Effort: Pulmonary effort is normal.     Breath sounds: Normal breath sounds.  Abdominal:     General: Bowel sounds are normal.     Palpations: Abdomen is soft.  Musculoskeletal:        General: Normal range of motion.     Cervical back: Neck supple.  Skin:    General: Skin is warm and dry.  Neurological:     Mental Status: She is alert.    Radiology: No results found.  Laboratory examination:   Recent Labs    07/14/19 1020  NA 140  K 4.6  CL 105  CO2 28  GLUCOSE 86  BUN 15  CREATININE 0.57  CALCIUM 9.2   CMP Latest Ref Rng & Units 07/14/2019 04/04/2019 03/26/2019  Glucose 70 - 99 mg/dL 86 98 83  BUN 6 - 23 mg/dL 15 14 16   Creatinine 0.40 - 1.20 mg/dL 0.57 0.65 0.59  Sodium 135 - 145 mEq/L 140 136 140  Potassium 3.5 - 5.1 mEq/L 4.6 3.6 3.7  Chloride 96 - 112 mEq/L 105 106 106  CO2 19 - 32 mEq/L 28 22 27   Calcium 8.4 - 10.5 mg/dL 9.2 9.1 8.7  Total Protein 6.0 - 8.3 g/dL 6.3 - -  Total Bilirubin 0.2 - 1.2 mg/dL 0.4 - -  Alkaline Phos 39 - 117 U/L 41 - -  AST 0 - 37 U/L 12 - -  ALT 0 - 35 U/L 10 - -   CBC Latest Ref Rng & Units 04/04/2019 01/13/2019 11/28/2018  WBC 4.0 - 10.5 K/uL 5.1 5.7 5.5  Hemoglobin 12.0 - 15.0 g/dL 13.5 12.8 13.5  Hematocrit 36.0 - 46.0 % 40.1 38.6 39.6  Platelets 150 - 400 K/uL 274 287 301.0   Lipid Panel Recent Labs    07/14/19 1020  CHOL 175  TRIG 37.0  LDLCALC 102*   VLDL 7.4  HDL 66.40  CHOLHDL 3    HEMOGLOBIN A1C No results found for: HGBA1C, MPG  Component Ref Range & Units 1 yr ago 8 yr ago 9 yr ago 10 yr ago  TSH 0.35 - 4.50 uIU/mL 1.14          Medications and allergies  No Known Allergies   Current Outpatient Medications on File Prior to Visit  Medication Sig Dispense Refill  . diclofenac (CATAFLAM) 50 MG tablet Take 50 mg by mouth as needed.    . SUMAtriptan (IMITREX) 100 MG tablet Take 1 tablet (100 mg total) by mouth every 2 (two) hours as needed for migraine. May repeat in 2 hours if headache persists or recurs. 10 tablet 0  . topiramate (TOPAMAX) 25 MG tablet 1 tab po daily at bedtime, if tolerating increase to 2 at bedtime after 1 week. 60 tablet 1   Current Facility-Administered Medications on File Prior to Visit  Medication Dose Route Frequency Provider Last Rate Last Admin  . gadopentetate dimeglumine (MAGNEVIST) injection 20 mL  20 mL Intravenous Once PRN Melvenia Beam, MD        Cardiac Studies:   Zio patch 04/02/2019: 3 days: Minimum heart rate 48 bpm, maximum heart rate 137 bpm.  Predominant sinus rhythm.  Rare PACs and PVCs.  There were 18 patient diary events which did not reveal any significant arrhythmia.,  Occasional correlation with PACs and PVCs.   EKG   EKG 07/01/2020: Normal sinus rhythm with rate of 67 bpm, normal axis.  No evidence of ischemia, normal EKG. No significant change from 05/12/2019.  Assessment     ICD-10-CM   1. Palpitations  R00.2 EKG 12-Lead    verapamil (CALAN-SR) 180 MG CR tablet  2. Class 3 severe obesity due to excess calories without serious comorbidity with body mass index (BMI) of 40.0 to 44.9 in adult (HCC)  E66.01 buPROPion (WELLBUTRIN SR) 150 MG 12 hr tablet   Z68.41   3. Migraine with aura and without status migrainosus, not intractable  G43.109 verapamil (CALAN-SR) 180 MG CR tablet  4. Situational anxiety  F41.8 buPROPion (WELLBUTRIN SR) 150 MG 12 hr tablet    Meds ordered  this encounter  Medications  . verapamil (CALAN-SR) 180 MG CR tablet    Sig: Take  1 tablet (180 mg total) by mouth every morning.    Dispense:  30 tablet    Refill:  2  . buPROPion (WELLBUTRIN SR) 150 MG 12 hr tablet    Sig: Take 1 tablet (150 mg total) by mouth 2 (two) times daily. AM and 4PM    Dispense:  30 tablet    Refill:  2    Medications Discontinued During This Encounter  Medication Reason  . pantoprazole (PROTONIX) 40 MG tablet Error  . omeprazole (PRILOSEC) 40 MG capsule Error   (patient not taking)  Recommendations:   Tiffany Donovan  is a 34 y.o.  Caucasian patient  with no significant prior cardiac history, seen in the emergency room on 04/04/2019 for palpitations ZIO Patch monitoring for 3 days which revealed occasional PACs.  Due to persistent symptoms she is now referred back for further cardiac evaluation.  I again had a lengthy discussion with the patient that her symptoms are most consistent with PACs and PVCs.  Do not suspect any other arrhythmias.  Also she has been drinking excessive amounts of alcohol that may be contributing.  She is also going through significant amount of stress as she and her partner/wife are not getting together well.  She is also taking care of 2 of her children along with working 2 jobs.  Situational anxiety may also be contributing to her symptoms.  She has also gained weight.  After long discussion, both for migraine headache and palpitations, propranolol XL would have been the best option however in view of risk of weight gain, will try verapamil SR 180 mg daily.  I have also added Wellbutrin both for weight loss and depression.  Advised her to follow-up with Dr. Eliezer Lofts for further management and I am certainly happy to see her back if her symptoms were to recur or are not improved.   Adrian Prows, MD, [facility] 07/04/2020, 12:45 PM Office: [phone] Pager: [phone]

## 2020-07-08 ENCOUNTER — Other Ambulatory Visit: Payer: Self-pay | Admitting: Cardiology

## 2020-07-08 DIAGNOSIS — Z6841 Body Mass Index (BMI) 40.0 and over, adult: Secondary | ICD-10-CM

## 2020-07-08 DIAGNOSIS — F418 Other specified anxiety disorders: Secondary | ICD-10-CM

## 2020-07-10 IMAGING — US ULTRASOUND ABDOMEN LIMITED
1 series · 14 of 25 positions shown · non-contrast
Comparison: None.

CLINICAL DATA: Epigastric pain.

EXAM:
ULTRASOUND ABDOMEN LIMITED RIGHT UPPER QUADRANT

[Series 1: ultrasound abdomen limited · 14 of 43 slices shown]
[im 1/43]
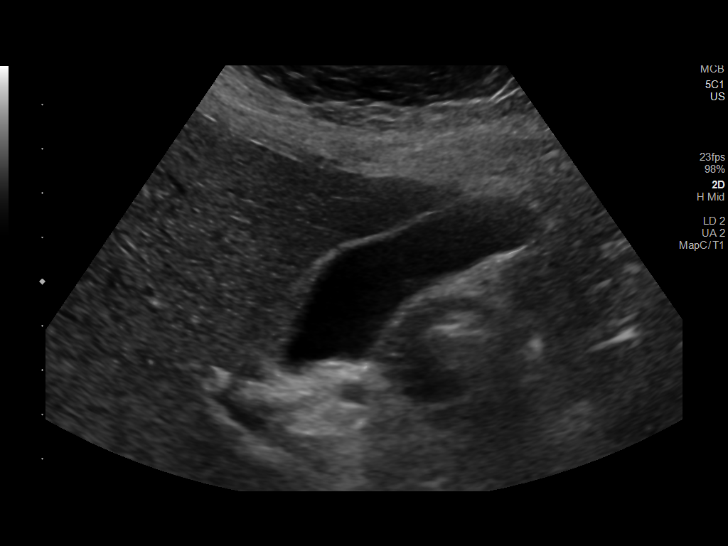
[im 4/43]
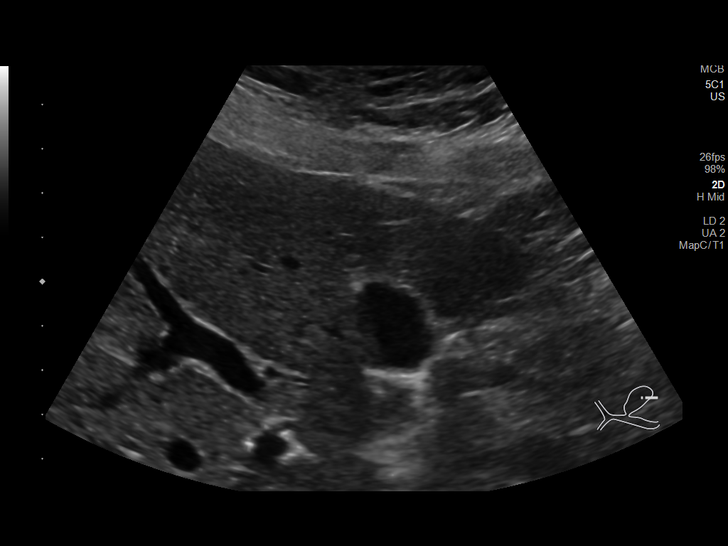
[im 8/43]
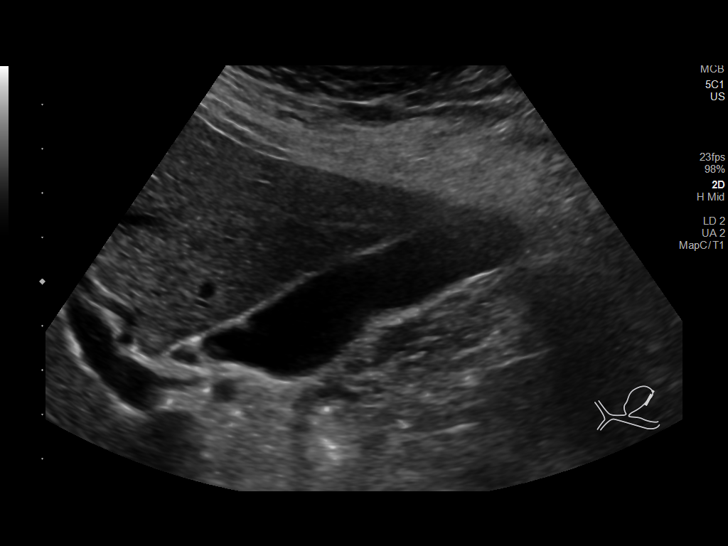
[im 11/43]
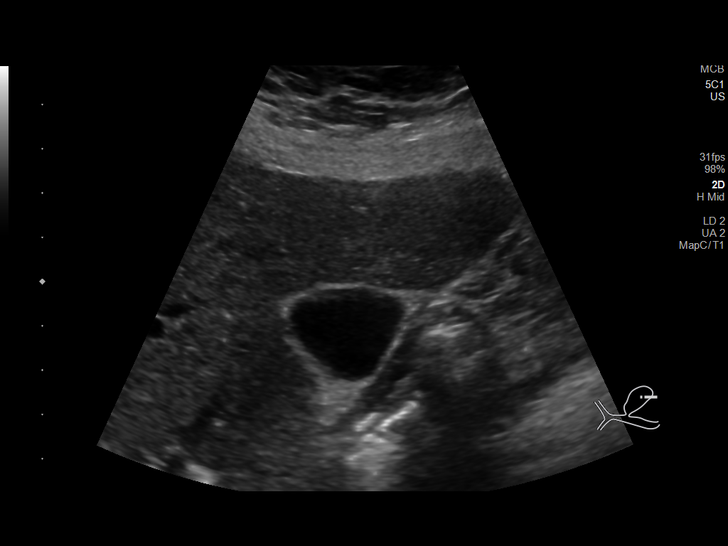
[im 15/43]
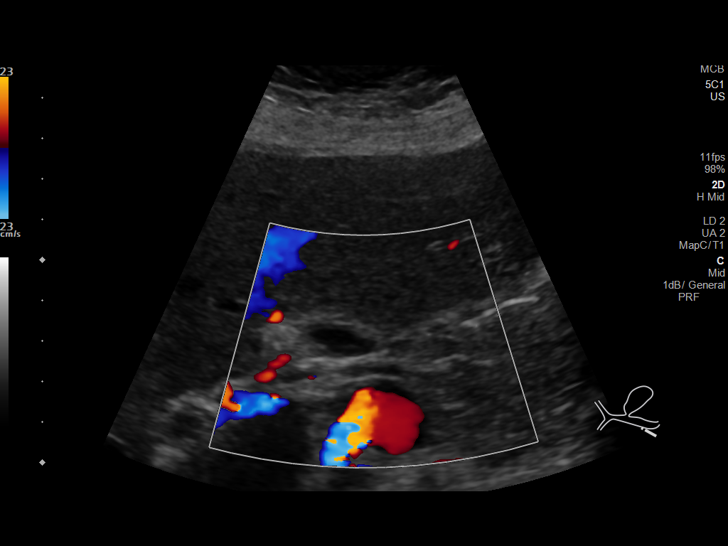
[im 16/43]
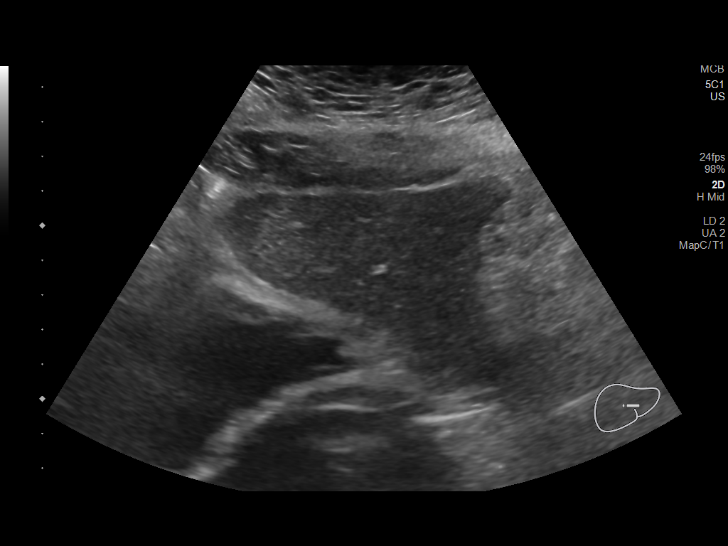
[im 20/43]
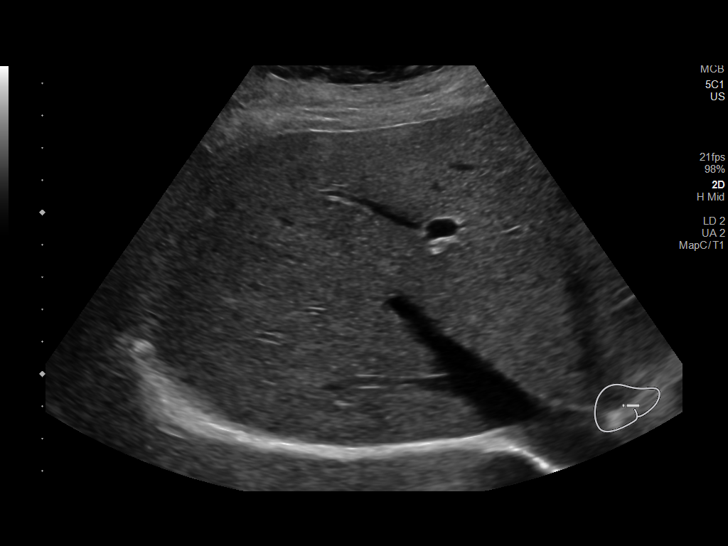
[im 23/43]
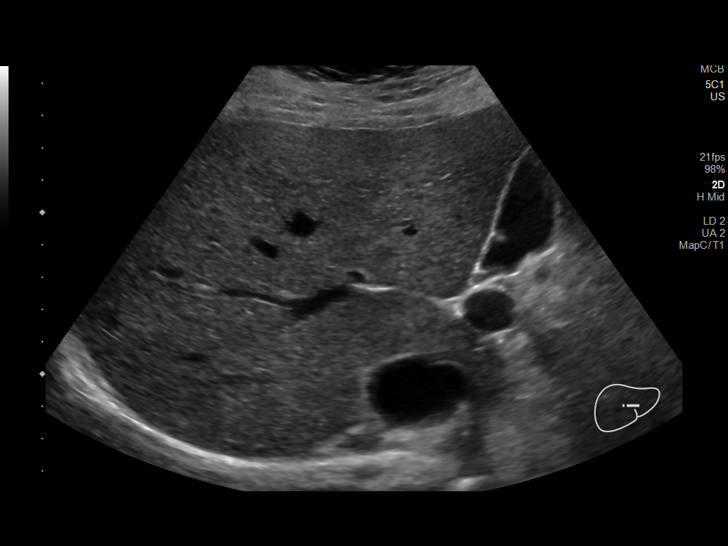
[im 27/43]
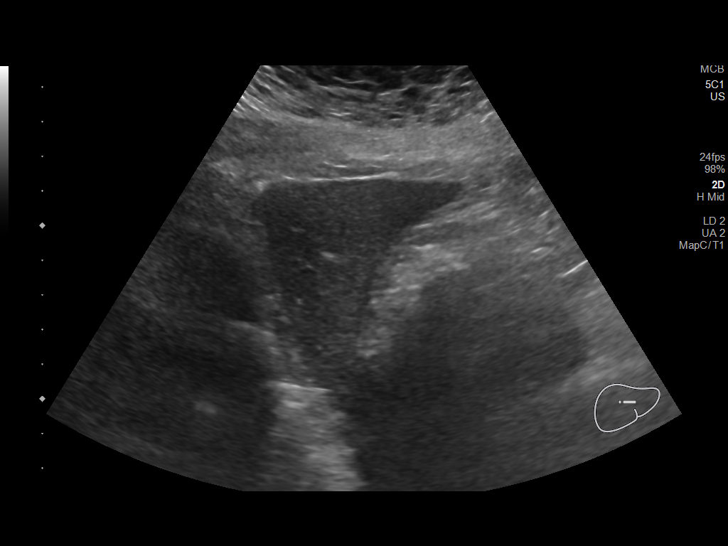
[im 29/43]
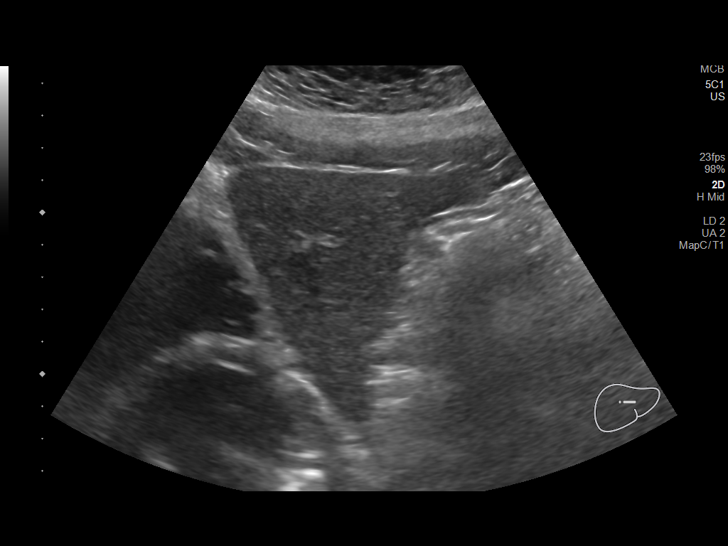
[im 32/43]
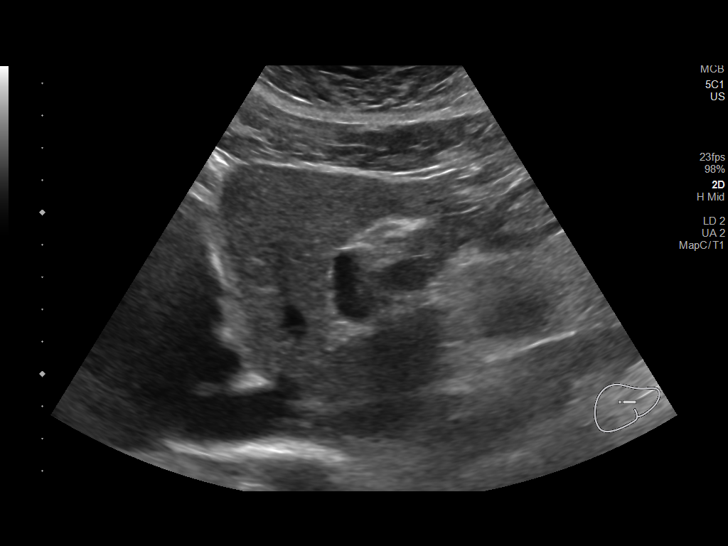
[im 36/43]
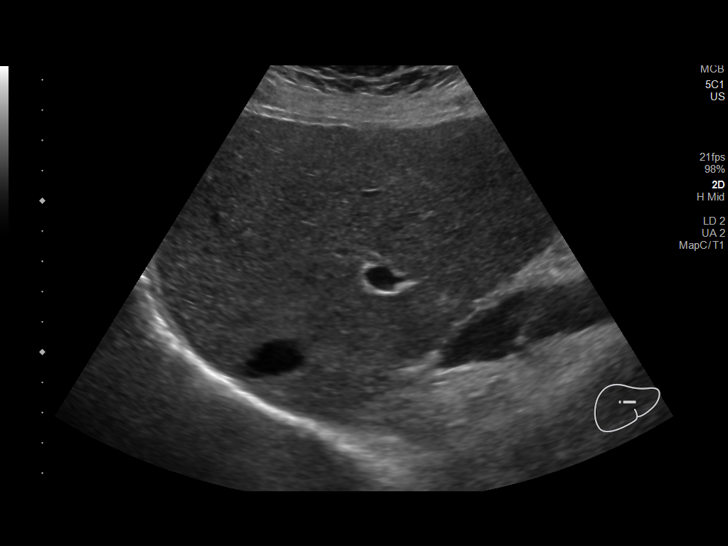
[im 39/43]
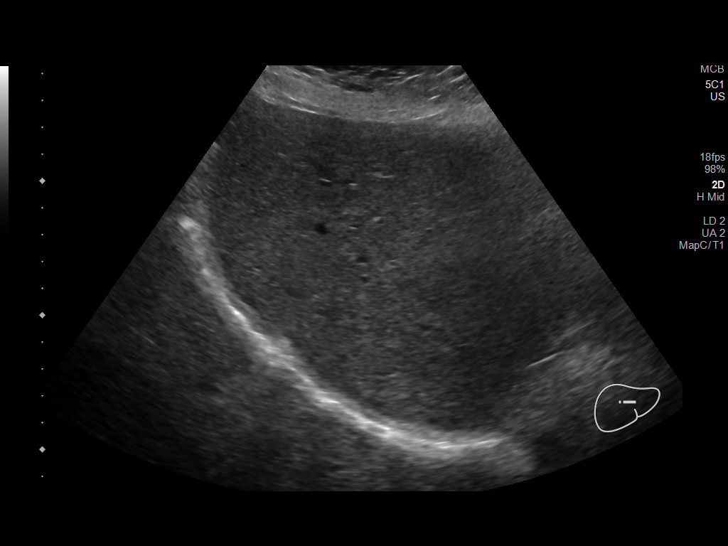
[im 43/43]
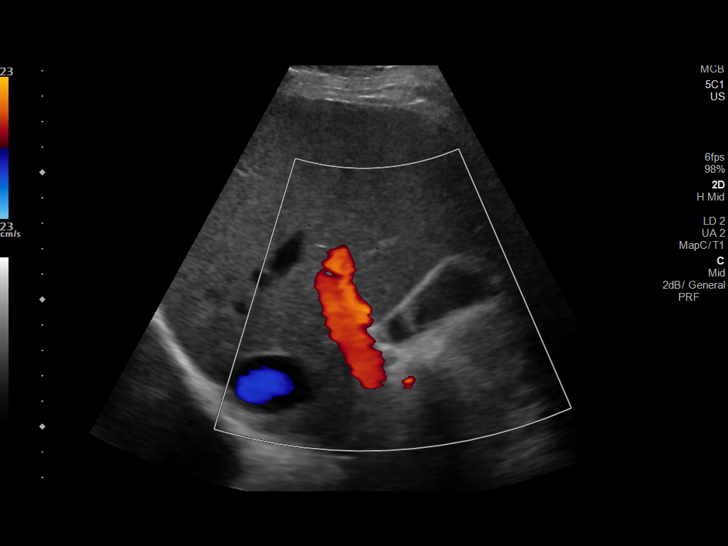

[14 of 25 positions shown; findings below may reference images not displayed]

FINDINGS: Gallbladder:

Physiologically distended. No gallstones or wall thickening
visualized. No sonographic Murphy sign noted by sonographer.

Common bile duct:

Diameter: 3 mm, normal.

Liver:

No focal lesion identified. Within normal limits in parenchymal
echogenicity. Portal vein is patent on color Doppler imaging with
normal direction of blood flow towards the liver.
IMPRESSION: Negative right upper quadrant ultrasound.

## 2020-07-21 NOTE — Telephone Encounter (Signed)
Okay to provide note as requested. Let pt know.

## 2020-07-23 ENCOUNTER — Other Ambulatory Visit: Payer: Self-pay | Admitting: Family Medicine

## 2020-07-26 MED ORDER — TOPIRAMATE 25 MG PO TABS: 50.0000 mg | ORAL_TABLET | Freq: Every day | ORAL | 0 refills | Status: DC

## 2020-07-26 NOTE — Addendum Note (Signed)
Addended by: Damita Lack on: 07/26/2020 05:26 PM   Modules accepted: Orders

## 2020-07-26 NOTE — Telephone Encounter (Signed)
Received fax that insurance requires 90 day supply.

## 2020-09-07 ENCOUNTER — Other Ambulatory Visit: Payer: Self-pay

## 2020-09-07 ENCOUNTER — Emergency Department (HOSPITAL_COMMUNITY)
Admission: EM | Admit: 2020-09-07 | Discharge: 2020-09-07 | Disposition: A | Discharge status: Home / Self Care | Payer: BLUE CROSS/BLUE SHIELD | Attending: Emergency Medicine | Admitting: Emergency Medicine

## 2020-09-07 DIAGNOSIS — K5900 Constipation, unspecified: Secondary | ICD-10-CM | POA: Diagnosis not present

## 2020-09-07 DIAGNOSIS — Z5321 Procedure and treatment not carried out due to patient leaving prior to being seen by health care provider: Secondary | ICD-10-CM | POA: Diagnosis not present

## 2020-09-07 DIAGNOSIS — R531 Weakness: Secondary | ICD-10-CM | POA: Diagnosis not present

## 2020-09-07 LAB — CBC
HCT: 40.4 % (ref 36.0–46.0)
Hemoglobin: 14 g/dL (ref 12.0–15.0)
MCH: 31 pg (ref 26.0–34.0)
MCHC: 34.7 g/dL (ref 30.0–36.0)
MCV: 89.4 fL (ref 80.0–100.0)
Platelets: 345 10*3/uL (ref 150–400)
RBC: 4.52 MIL/uL (ref 3.87–5.11)
RDW: 12.7 % (ref 11.5–15.5)
WBC: 15 10*3/uL — ABNORMAL HIGH (ref 4.0–10.5)
nRBC: 0 % (ref 0.0–0.2)

## 2020-09-07 LAB — COMPREHENSIVE METABOLIC PANEL
ALT: 14 U/L (ref 0–44)
AST: 15 U/L (ref 15–41)
Albumin: 4 g/dL (ref 3.5–5.0)
Alkaline Phosphatase: 43 U/L (ref 38–126)
Anion gap: 11 (ref 5–15)
BUN: 17 mg/dL (ref 6–20)
CO2: 21 mmol/L — ABNORMAL LOW (ref 22–32)
Calcium: 8.9 mg/dL (ref 8.9–10.3)
Chloride: 105 mmol/L (ref 98–111)
Creatinine, Ser: 0.75 mg/dL (ref 0.44–1.00)
GFR, Estimated: >60 mL/min (ref >60)
Glucose, Bld: 95 mg/dL (ref 70–99)
Potassium: 4.1 mmol/L (ref 3.5–5.1)
Sodium: 137 mmol/L (ref 135–145)
Total Bilirubin: 0.6 mg/dL (ref 0.3–1.2)
Total Protein: 7.2 g/dL (ref 6.5–8.1)

## 2020-09-07 NOTE — ED Notes (Signed)
Pt called X 3 for vitals recheck. Pt could not be found.  

## 2020-09-07 NOTE — ED Notes (Signed)
Pt called X2 for vitals recheck 

## 2020-09-07 NOTE — ED Triage Notes (Signed)
Pt c/o constipation since approx 4pm. States she felt the urge to use the restroom, unable to do so, attempted manual disimpaction herself, noticed bleeding at the end of bathroom trip, but denies straining. Endorses feeling weak since using bathroom, states she is unable to stand due to abd pain/pressure.

## 2020-09-08 ENCOUNTER — Telehealth: Payer: Self-pay

## 2020-09-08 ENCOUNTER — Emergency Department (HOSPITAL_COMMUNITY)
Admission: EM | Admit: 2020-09-08 | Discharge: 2020-09-09 | Disposition: A | Discharge status: Home / Self Care | Payer: BLUE CROSS/BLUE SHIELD | Attending: Emergency Medicine | Admitting: Emergency Medicine

## 2020-09-08 ENCOUNTER — Encounter (HOSPITAL_COMMUNITY): Payer: Self-pay | Admitting: Emergency Medicine

## 2020-09-08 DIAGNOSIS — Z5321 Procedure and treatment not carried out due to patient leaving prior to being seen by health care provider: Secondary | ICD-10-CM | POA: Diagnosis not present

## 2020-09-08 DIAGNOSIS — K5900 Constipation, unspecified: Secondary | ICD-10-CM | POA: Insufficient documentation

## 2020-09-08 NOTE — ED Triage Notes (Signed)
Pt arrives to ED after being triaged last night for constipation but unable to stay due long wait, last night was was impacted to the point where she used gloves to try and get her stool out , while waiting last night she was able to have a very large hard BM with bright red blood after 2 hours, now has pressure and pain in her rectum. She also had cbc & cmp done last night wbc 15.

## 2020-09-08 NOTE — Telephone Encounter (Signed)
[facility] Night - Client Nonclinical Telephone Record  [facility] Client [facility] Night - Client Client Site [place] - Night Physician Eliezer Lofts - MD Contact Type Call Who Is Calling Patient / Member / Family / Caregiver Caller Name [place] Phone Number [phone] Patient Name Tiffany Donovan Patient DOB 09-27-1985 Call Type Message Only Information Provided Reason for Call Request to Schedule Office Appointment Initial Comment Caller states that she needs to schedule an appointment for today. Declined triage. Disp. Time Disposition Final User 09/08/2020 7:41:31 AM General Information Provided Yes Windy Canny Call Closed By: Windy Canny Transaction Date/Time: 09/08/2020 7:39:50 AM (ET)

## 2020-09-08 NOTE — Telephone Encounter (Signed)
Called and spoke with patient who stated that she went to the hospital yesterday for constipation, but left before being seen. Patient stated that she did get blood work while she was there and got her results back which showed a high WBC count and wanted to follow-up with her provider. Informed patient that her provider was not in office today and she had no appointments for tomorrow. Patient stated that she would like to be seen today. Unfortunately, all providers at [facility] are booked. Patient declined to be seen at another office wen offered. Patient stated that she just wanted to be seen by one of our doctors. Patient has an appointment tomorrow with Dr. Damita Dunnings. Patient denied fever, SOB, chest pain, or other COVID symptoms. Patient was embarrassed and stated, "I just want to see a doctor" when asked further questions regarding her symptoms. UC and ED precautions given. Patient verbalized understanding.

## 2020-09-08 NOTE — Telephone Encounter (Signed)
Noted  

## 2020-09-09 ENCOUNTER — Other Ambulatory Visit: Payer: Self-pay

## 2020-09-09 ENCOUNTER — Ambulatory Visit (INDEPENDENT_AMBULATORY_CARE_PROVIDER_SITE_OTHER)
Admission: RE | Admit: 2020-09-09 | Discharge: 2020-09-09 | Disposition: A | Discharge status: Home / Self Care | Payer: BLUE CROSS/BLUE SHIELD | Source: Ambulatory Visit | Attending: Family Medicine | Admitting: Family Medicine

## 2020-09-09 ENCOUNTER — Encounter: Payer: Self-pay | Admitting: Family Medicine

## 2020-09-09 ENCOUNTER — Ambulatory Visit: Payer: BLUE CROSS/BLUE SHIELD | Admitting: Family Medicine

## 2020-09-09 VITALS — BP 118/78 | HR 83 | Temp 97.6°F | Ht 62.0 in | Wt 214.0 lb

## 2020-09-09 DIAGNOSIS — R1032 Left lower quadrant pain: Secondary | ICD-10-CM

## 2020-09-09 LAB — CBC WITH DIFFERENTIAL/PLATELET
Basophils Absolute: 0 10*3/uL (ref 0.0–0.1)
Basophils Relative: 0.9 % (ref 0.0–3.0)
Eosinophils Absolute: 0.3 10*3/uL (ref 0.0–0.7)
Eosinophils Relative: 6.2 % — ABNORMAL HIGH (ref 0.0–5.0)
HCT: 38 % (ref 36.0–46.0)
Hemoglobin: 12.9 g/dL (ref 12.0–15.0)
Lymphocytes Relative: 24.5 % (ref 12.0–46.0)
Lymphs Abs: 1.2 10*3/uL (ref 0.7–4.0)
MCHC: 34 g/dL (ref 30.0–36.0)
MCV: 89.1 fl (ref 78.0–100.0)
Monocytes Absolute: 0.3 10*3/uL (ref 0.1–1.0)
Monocytes Relative: 5.6 % (ref 3.0–12.0)
Neutro Abs: 3.1 10*3/uL (ref 1.4–7.7)
Neutrophils Relative %: 62.8 % (ref 43.0–77.0)
Platelets: 288 10*3/uL (ref 150.0–400.0)
RBC: 4.27 Mil/uL (ref 3.87–5.11)
RDW: 13 % (ref 11.5–15.5)
WBC: 4.9 10*3/uL (ref 4.0–10.5)

## 2020-09-09 NOTE — Progress Notes (Signed)
This visit occurred during the SARS-CoV-2 public health emergency.  Safety protocols were in place, including screening questions prior to the visit, additional usage of staff PPE, and extensive cleaning of exam room while observing appropriate contact time as indicated for disinfecting solutions.  WBC elevation noted.  Not seen at ER due to wait.  Today is day #3 of sx.    Recently constipated, was able to have BM 09/07/20, felt some better after that.  She had near syncope but not syncope, had chills, felt shaky during time on toilet.  In the meantime, some HA.  Some upper and lower abd pain.  No BM in the meantime, since 09/07/20.  She was able to eat yesterday and today.  No vomiting.  Had a taco last night.  No burning with urination.  Blood with BM on the 15th but not o/w.    LMP was 1 week ago, normal.  No fevers.  No CP, not SOB.    Meds, vitals, and allergies reviewed.   ROS: Per HPI unless specifically indicated in ROS section   GEN: nad, alert and oriented HEENT: ncat NECK: supple w/o LA CV: rrr.  PULM: ctab, no inc wob ABD: soft, +bs, No abd pain initially but LLQ mildly ttp on exam.  No rebound.   EXT: no edema SKIN: no acute rash

## 2020-09-09 NOTE — Patient Instructions (Signed)
Go to the lab on the way out.   If you have mychart we'll likely use that to update you.    If not BM by tomorrow, then I would try taking miralax.  Use daily if needed.   Take care.  Glad to see you.

## 2020-09-09 NOTE — ED Notes (Signed)
Pt is no longer in ED.

## 2020-09-12 NOTE — Assessment & Plan Note (Signed)
With history of elevated white count. Recheck CBC pending. Minimal abdominal discomfort on exam, no rebound. Still okay for outpatient follow-up. I do not suspect an ominous diagnosis. More likely the constipation is the issue. She could have had a vagal episode without syncope but relatively lower blood pressure in the midst of a bowel movement. Discussed. Reasonable to stay well-hydrated and use MiraLAX as needed. See notes on labs and imaging.

## 2020-09-13 ENCOUNTER — Encounter: Payer: Self-pay | Admitting: Family Medicine

## 2020-09-16 ENCOUNTER — Telehealth: Payer: Self-pay | Admitting: Family Medicine

## 2020-09-16 NOTE — Telephone Encounter (Signed)
Pt notified as instructed and pt voiced understanding and if she cannot keep the appt on 09/17/20 at 10:20 pt will call in AM to cancel. FYI to Dr Diona Browner and Dr Damita Dunnings.

## 2020-09-16 NOTE — Telephone Encounter (Signed)
I spoke with pt; pt has not had any injury. Pt said the back pain from shoulder to hip has gone away. Pt said the pains in lower and upper stomach hurt whether pt eats or does not eat. Pt said sometimes the pain subsides but pt said the pain is there most of the time. Now pain level is around a 4 but earlier this morning pain level was 7 and last night pain level was 9. Pt said had normal BM this morning and pt is passing gas periodically. Pt said the pain she is experiencing is not a "poop pain" but more of an intense dull hunger pain. Pt said had xray of abd last wk that was normal. No covid symptoms and no known covid exposure. Pt does not want to schedule appt at this time; pt wants to know what is the next step of testing or what to do. Pt said she knows her body and this has been going on too long. UC & ED precautions given and pt voiced understanding.

## 2020-09-16 NOTE — Telephone Encounter (Signed)
If this is a different pain, then she likely needs to be rechecked first here at the clinic vs UC/ER depending on severity.  She may need labs/imaging thereafter, but it depends on her exam.  I think OV is reasonable.  I sent this to PCP for input and FYI also.

## 2020-09-16 NOTE — Telephone Encounter (Signed)
This is pt message;  Conversation: Stomach and back pain (Newest Message First) September 16, 2020  [name], MD to Tiffany Donovan      8:01 AM I have been out of clinic let last 3 days.  I am responding to messages as quickly as I can this morning.  We'll call you about this.  Take care.    [name]  Last read by Tiffany Donovan at 8:03 AM on 09/16/2020. September 13, 2020      9:30 AM [name], CMA routed this conversation to [name], MD    Tiffany Donovan to [name], MD      9:17 AM Good morning.   Results on all the test are reassuring. I have been able to go to the restroom since Thursday night without using mirlax, but I am having pains in my lower stomach and upper stomach randomly. Especially when I eat and drink anything. As of yesterday, I am having a new pain in the left side of my back from shoulder area to hip area not sure if any of this is related.

## 2020-09-16 NOTE — Telephone Encounter (Signed)
Add in on my schedule at 10:20.Marland Kitchen if pain increasing/severe go to ER in meantime.

## 2020-09-16 NOTE — Telephone Encounter (Signed)
Please call and triage patient. Thanks.

## 2020-09-16 NOTE — Telephone Encounter (Signed)
There are not available appts at [facility] 09/16/20 and 09/17/20; There is a note to Dr Glori Bickers now to see if can use 09/17/20 12:30 for pt of Dr Glori Bickers that needs to be seen. Sending note to DR Damita Dunnings, Dr Dionicio Stall CMA and Larene Beach RN.

## 2020-09-17 ENCOUNTER — Ambulatory Visit: Payer: BLUE CROSS/BLUE SHIELD | Admitting: Family Medicine

## 2020-09-17 DIAGNOSIS — Z0289 Encounter for other administrative examinations: Secondary | ICD-10-CM

## 2020-09-17 NOTE — Telephone Encounter (Signed)
I appreciate Dr. Rometta Emery help.

## 2020-09-22 ENCOUNTER — Other Ambulatory Visit: Payer: Self-pay | Admitting: Cardiology

## 2020-09-22 DIAGNOSIS — G43109 Migraine with aura, not intractable, without status migrainosus: Secondary | ICD-10-CM

## 2020-09-22 DIAGNOSIS — R002 Palpitations: Secondary | ICD-10-CM

## 2020-09-24 ENCOUNTER — Ambulatory Visit: Payer: BLUE CROSS/BLUE SHIELD | Admitting: Family Medicine

## 2020-10-24 ENCOUNTER — Telehealth: Payer: Self-pay | Admitting: Family Medicine

## 2020-10-25 NOTE — Telephone Encounter (Signed)
Left voice message for patient to call the office  

## 2020-10-25 NOTE — Telephone Encounter (Signed)
Please schedule CPE with fasting labs prior with Dr. Bedsole.  

## 2020-11-01 NOTE — Telephone Encounter (Signed)
Left voice message to call the office  

## 2020-11-09 NOTE — Telephone Encounter (Signed)
Cpe and labs scheduled

## 2020-11-16 ENCOUNTER — Other Ambulatory Visit: Payer: BLUE CROSS/BLUE SHIELD

## 2020-11-16 ENCOUNTER — Telehealth: Payer: Self-pay | Admitting: Family Medicine

## 2020-11-16 DIAGNOSIS — Z13220 Encounter for screening for lipoid disorders: Secondary | ICD-10-CM

## 2020-11-16 NOTE — Telephone Encounter (Signed)
-----   Message from Cloyd Stagers, RT sent at 11/02/2020  1:22 PM EDT ----- Regarding: Lab Orders for Tuesday 4.26.2022 Please place lab orders for Tuesday 4.26.2022, office visit for physical on Friday 5.6.2022 Thank you, Dyke Maes RT(R)

## 2020-11-24 NOTE — Telephone Encounter (Signed)
LVM for pt to call me

## 2020-11-24 NOTE — Telephone Encounter (Signed)
Pt called- I informed wrong paperwork sent- she is sending new paperwork via mychart

## 2020-11-24 NOTE — Telephone Encounter (Signed)
Semi completed FMLA paperwork placed in PCP's inbox for review, completion,sign and date  Due 5/7 but received 5/4, I told pt it could take 5-7 business days

## 2020-11-26 ENCOUNTER — Encounter: Payer: BLUE CROSS/BLUE SHIELD | Admitting: Family Medicine

## 2020-11-26 NOTE — Telephone Encounter (Signed)
Done in outbox

## 2020-11-29 NOTE — Telephone Encounter (Signed)
Copy for Raquel Sarna to email to [email or web address] And please CC pt [email or web address]

## 2020-11-29 NOTE — Telephone Encounter (Signed)
Called pt to inform her paperwork is completed  Giving Donzetta Matters a copy to email, no fax available  Copy for pt up front  Copy for scan   Copy for billing  Copy retained by me

## 2020-12-01 NOTE — Telephone Encounter (Signed)
This has been emailed.

## 2020-12-22 ENCOUNTER — Other Ambulatory Visit: Payer: Self-pay | Admitting: Cardiology

## 2020-12-22 DIAGNOSIS — R002 Palpitations: Secondary | ICD-10-CM

## 2020-12-22 DIAGNOSIS — G43109 Migraine with aura, not intractable, without status migrainosus: Secondary | ICD-10-CM

## 2021-01-12 ENCOUNTER — Other Ambulatory Visit: Payer: Self-pay | Admitting: Cardiology

## 2021-01-12 DIAGNOSIS — Z6841 Body Mass Index (BMI) 40.0 and over, adult: Secondary | ICD-10-CM

## 2021-01-12 DIAGNOSIS — F418 Other specified anxiety disorders: Secondary | ICD-10-CM

## 2021-01-25 ENCOUNTER — Ambulatory Visit: Payer: BLUE CROSS/BLUE SHIELD | Admitting: Family Medicine

## 2021-01-26 ENCOUNTER — Other Ambulatory Visit: Payer: Self-pay | Admitting: Family Medicine

## 2021-01-26 NOTE — Telephone Encounter (Signed)
Please try to reschedule CPE with fasting labs prior.  Patient no showed her CPE in May.

## 2021-01-26 NOTE — Telephone Encounter (Signed)
Patient is scheduled in August for CPE.

## 2021-01-28 ENCOUNTER — Other Ambulatory Visit: Payer: Self-pay | Admitting: Family Medicine

## 2021-02-27 ENCOUNTER — Other Ambulatory Visit: Payer: Self-pay | Admitting: Family Medicine

## 2021-03-10 ENCOUNTER — Ambulatory Visit (INDEPENDENT_AMBULATORY_CARE_PROVIDER_SITE_OTHER): Payer: BLUE CROSS/BLUE SHIELD | Admitting: Family Medicine

## 2021-03-10 ENCOUNTER — Other Ambulatory Visit: Payer: Self-pay

## 2021-03-10 ENCOUNTER — Encounter: Payer: Self-pay | Admitting: Family Medicine

## 2021-03-10 VITALS — BP 100/60 | HR 66 | Temp 98.1°F | Ht 62.5 in | Wt 210.2 lb

## 2021-03-10 DIAGNOSIS — F321 Major depressive disorder, single episode, moderate: Secondary | ICD-10-CM

## 2021-03-10 DIAGNOSIS — Z0000 Encounter for general adult medical examination without abnormal findings: Secondary | ICD-10-CM

## 2021-03-10 DIAGNOSIS — G43719 Chronic migraine without aura, intractable, without status migrainosus: Secondary | ICD-10-CM | POA: Diagnosis not present

## 2021-03-10 NOTE — Patient Instructions (Addendum)
Increase topamax to 3 tabs of 25 mg at bedtime ( 75 mg ), if not decreasing numbner of migraine in 1 month, increase to 100 mg. Call if you note side effects from the higher dose. Bring in FMLA forms for changes discussed.  Please call the location of your choice from the menu below to schedule your Mammogram and/or Bone Density appointment.    [facility]                      Phone:  [phone], [place] [address]                                                             Services: Traditional and 3D Mammogram, [place] [facility]                 Phone: [phone], [place] [address]    Service: Bone Density ONLY   *this site does NOT perform mammograms  [place] [facility]                        Phone:  [phone] [address]                                  [place], [place] [address]                                            Services:  3D Mammogram and Bone Density

## 2021-03-10 NOTE — Progress Notes (Signed)
Patient ID: Tiffany Donovan, female    DOB: 09-22-1985, 35 y.o.   MRN: [id number]  This visit was conducted in person.  BP 100/60   Pulse 66   Temp 98.1 F (36.7 C) (Temporal)   Ht 5' 2.5" (1.588 m)   Wt 210 lb 4 oz (95.4 kg)   LMP 02/28/2021   SpO2 98%   BMI 37.84 kg/m    CC: Chief Complaint  Patient presents with   Annual Exam    Subjective:   HPI: Tiffany Donovan is a 35 y.o. female presenting on 03/10/2021 for Annual Exam    GAD, PMDD, MDD: Well controlled on current regimen  PHQ9: 4  Obesity  Has lost 9 lbs in last year Wt Readings from Last 3 Encounters:  03/10/21 210 lb 4 oz (95.4 kg)  09/09/20 214 lb (97.1 kg)  07/01/20 219 lb (99.3 kg)    Migraine:  Inadequate control on topiramate 50 mg at bedtime. Using imitrex for severe migraine.    Daily milder headache , with severe HA 2 times a week.  No clear triggers.  Needs FMLA to say  2 days per week instead of 3 days per months.  She is on prednisone for  hip, back pain.Marland Kitchen on 6 day taper. Also in PT.   Relevant past medical, surgical, family and social history reviewed and updated as indicated. Interim medical history since our last visit reviewed. Allergies and medications reviewed and updated. Outpatient Medications Prior to Visit  Medication Sig Dispense Refill   buPROPion (WELLBUTRIN SR) 150 MG 12 hr tablet TAKE 1 TABLET (150 MG TOTAL) BY MOUTH 2 (TWO) TIMES DAILY IN THE MORNING AND 4PM 180 tablet 1   naproxen (NAPROSYN) 500 MG tablet Take 500 mg by mouth 2 (two) times daily as needed.     predniSONE (STERAPRED UNI-PAK 21 TAB) 10 MG (21) TBPK tablet See admin instructions. follow package directions     SUMAtriptan (IMITREX) 100 MG tablet TAKE 1 TABLET (100 MG TOTAL) BY MOUTH EVERY 2 (TWO) HOURS AS NEEDED FOR MIGRAINE. MAY REPEAT IN 2 HOURS IF HEADACHE PERSISTS OR RECURS. 10 tablet 0   topiramate (TOPAMAX) 25 MG tablet TAKE 2 TABLETS BY MOUTH AT BEDTIME 180 tablet 0   diclofenac (CATAFLAM) 50 MG tablet  Take 50 mg by mouth as needed.     verapamil (CALAN-SR) 180 MG CR tablet TAKE 1 TABLET (180 MG TOTAL) BY MOUTH EVERY MORNING. 90 tablet 0   Facility-Administered Medications Prior to Visit  Medication Dose Route Frequency Provider Last Rate Last Admin   gadopentetate dimeglumine (MAGNEVIST) injection 20 mL  20 mL Intravenous Once PRN Melvenia Beam, MD         Per HPI unless specifically indicated in ROS section below Review of Systems  Constitutional:  Negative for fatigue and fever.  HENT:  Negative for congestion.   Eyes:  Negative for pain.  Respiratory:  Negative for cough and shortness of breath.   Cardiovascular:  Negative for chest pain, palpitations and leg swelling.  Gastrointestinal:  Negative for abdominal pain.  Genitourinary:  Negative for dysuria and vaginal bleeding.  Musculoskeletal:  Negative for back pain.  Neurological:  Positive for headaches. Negative for syncope and light-headedness.  Psychiatric/Behavioral:  Negative for dysphoric mood.   Objective:  BP 100/60   Pulse 66   Temp 98.1 F (36.7 C) (Temporal)   Ht 5' 2.5" (1.588 m)   Wt 210 lb 4 oz (95.4 kg)   LMP  02/28/2021   SpO2 98%   BMI 37.84 kg/m   Wt Readings from Last 3 Encounters:  03/10/21 210 lb 4 oz (95.4 kg)  09/09/20 214 lb (97.1 kg)  07/01/20 219 lb (99.3 kg)      Physical Exam Constitutional:      General: She is not in acute distress.    Appearance: Normal appearance. She is well-developed. She is obese. She is not ill-appearing or toxic-appearing.  HENT:     Head: Normocephalic.     Right Ear: Hearing, tympanic membrane, ear canal and external ear normal.     Left Ear: Hearing, tympanic membrane, ear canal and external ear normal.     Nose: Nose normal.  Eyes:     General: Lids are normal. Lids are everted, no foreign bodies appreciated.     Conjunctiva/sclera: Conjunctivae normal.     Pupils: Pupils are equal, round, and reactive to light.  Neck:     Thyroid: No thyroid mass  or thyromegaly.     Vascular: No carotid bruit.     Trachea: Trachea normal.  Cardiovascular:     Rate and Rhythm: Normal rate and regular rhythm.     Heart sounds: Normal heart sounds, S1 normal and S2 normal. No murmur heard.   No gallop.  Pulmonary:     Effort: Pulmonary effort is normal. No respiratory distress.     Breath sounds: Normal breath sounds. No wheezing, rhonchi or rales.  Abdominal:     General: Bowel sounds are normal. There is no distension or abdominal bruit.     Palpations: Abdomen is soft. There is no fluid wave or mass.     Tenderness: There is no abdominal tenderness. There is no guarding or rebound.     Hernia: No hernia is present.  Musculoskeletal:     Cervical back: Normal range of motion and neck supple.  Lymphadenopathy:     Cervical: No cervical adenopathy.  Skin:    General: Skin is warm and dry.     Findings: No rash.  Neurological:     Mental Status: She is alert.     Cranial Nerves: No cranial nerve deficit.     Sensory: No sensory deficit.  Psychiatric:        Mood and Affect: Mood is not anxious or depressed.        Speech: Speech normal.        Behavior: Behavior normal. Behavior is cooperative.        Judgment: Judgment normal.      Results for orders placed or performed in visit on 09/09/20  CBC with Differential/Platelet  Result Value Ref Range   WBC 4.9 4.0 - 10.5 K/uL   RBC 4.27 3.87 - 5.11 Mil/uL   Hemoglobin 12.9 12.0 - 15.0 g/dL   HCT 38.0 36.0 - 46.0 %   MCV 89.1 78.0 - 100.0 fl   MCHC 34.0 30.0 - 36.0 g/dL   RDW 13.0 11.5 - 15.5 %   Platelets 288.0 150.0 - 400.0 K/uL   Neutrophils Relative % 62.8 43.0 - 77.0 %   Lymphocytes Relative 24.5 12.0 - 46.0 %   Monocytes Relative 5.6 3.0 - 12.0 %   Eosinophils Relative 6.2 (H) 0.0 - 5.0 %   Basophils Relative 0.9 0.0 - 3.0 %   Neutro Abs 3.1 1.4 - 7.7 K/uL   Lymphs Abs 1.2 0.7 - 4.0 K/uL   Monocytes Absolute 0.3 0.1 - 1.0 K/uL   Eosinophils Absolute 0.3 0.0 - 0.7  K/uL    Basophils Absolute 0.0 0.0 - 0.1 K/uL    This visit occurred during the SARS-CoV-2 public health emergency.  Safety protocols were in place, including screening questions prior to the visit, additional usage of staff PPE, and extensive cleaning of exam room while observing appropriate contact time as indicated for disinfecting solutions.   COVID 19 screen:  No recent travel or known exposure to COVID19 The patient denies respiratory symptoms of COVID 19 at this time. The importance of social distancing was discussed today.   Assessment and Plan The patient's preventative maintenance and recommended screening tests for an annual wellness exam were reviewed in full today. Brought up to date unless services declined.  Counselled on the importance of diet, exercise, and its role in overall health and mortality. The patient's FH and SH was reviewed, including their home life, tobacco status, and drug and alcohol status.     Vaccines:  COVID x 2,  uptodate with td Pap/DVE:  2018 normal pap with no HPV Mammo: no early family history of breast cancer ( PGF may have had breast cancer).. some achiness in breasts off and on.. none now. Colon:  No early family history CRC Smoking Status: none ETOH/ drug use: 2 times a week/ none  HIV screen:   done  Problem List Items Addressed This Visit     Intractable chronic migraine without aura and without status migrainosus    Poor control, chronic.  Increase topiramate to 75 mg qHS, if no SE and still frequent migraine > 2 per month... increase further to 100 mg qHS.  If not improving she is agreeable to referral to headache wellness center/ neurology.  Needs FMLA to say  2 days per week instead of 3 days per months.      Relevant Medications   naproxen (NAPROSYN) 500 MG tablet   predniSONE (STERAPRED UNI-PAK 21 TAB) 10 MG (21) TBPK tablet   Moderate major depression (HCC)    Stable, chronic.  Continue current medication.         Routine  general medical examination at a health care facility - Primary    Eliezer Lofts, MD

## 2021-03-10 NOTE — Assessment & Plan Note (Addendum)
Poor control, chronic.  Increase topiramate to 75 mg qHS, if no SE and still frequent migraine > 2 per month... increase further to 100 mg qHS.  If not improving she is agreeable to referral to headache wellness center/ neurology.  Needs FMLA to say  2 days per week instead of 3 days per months.

## 2021-03-10 NOTE — Assessment & Plan Note (Signed)
Stable, chronic.  Continue current medication.    

## 2021-03-11 NOTE — Telephone Encounter (Signed)
Secure email used to send to employer and patient.  Copy for scan   Copy retained by

## 2021-03-11 NOTE — Telephone Encounter (Signed)
Spoke with pt to clarify needs  Semi completed paperwork placed in PCP's inbox for review, completion, sign and date

## 2021-03-22 ENCOUNTER — Encounter: Payer: Self-pay | Admitting: Family Medicine

## 2021-03-22 ENCOUNTER — Other Ambulatory Visit: Payer: Self-pay

## 2021-03-22 ENCOUNTER — Ambulatory Visit: Payer: BLUE CROSS/BLUE SHIELD | Admitting: Family Medicine

## 2021-03-22 VITALS — BP 90/60 | HR 79 | Temp 98.4°F | Ht 62.5 in | Wt 210.5 lb

## 2021-03-22 DIAGNOSIS — K219 Gastro-esophageal reflux disease without esophagitis: Secondary | ICD-10-CM

## 2021-03-22 DIAGNOSIS — G43001 Migraine without aura, not intractable, with status migrainosus: Secondary | ICD-10-CM

## 2021-03-22 DIAGNOSIS — R1013 Epigastric pain: Secondary | ICD-10-CM | POA: Insufficient documentation

## 2021-03-22 LAB — CBC WITH DIFFERENTIAL/PLATELET
Basophils Absolute: 0 10*3/uL (ref 0.0–0.1)
Basophils Relative: 0.8 % (ref 0.0–3.0)
Eosinophils Absolute: 0.2 10*3/uL (ref 0.0–0.7)
Eosinophils Relative: 4.7 % (ref 0.0–5.0)
HCT: 37.3 % (ref 36.0–46.0)
Hemoglobin: 12.5 g/dL (ref 12.0–15.0)
Lymphocytes Relative: 23.3 % (ref 12.0–46.0)
Lymphs Abs: 1.2 10*3/uL (ref 0.7–4.0)
MCHC: 33.5 g/dL (ref 30.0–36.0)
MCV: 91.9 fl (ref 78.0–100.0)
Monocytes Absolute: 0.3 10*3/uL (ref 0.1–1.0)
Monocytes Relative: 6.3 % (ref 3.0–12.0)
Neutro Abs: 3.4 10*3/uL (ref 1.4–7.7)
Neutrophils Relative %: 64.9 % (ref 43.0–77.0)
Platelets: 246 10*3/uL (ref 150.0–400.0)
RBC: 4.06 Mil/uL (ref 3.87–5.11)
RDW: 13.5 % (ref 11.5–15.5)
WBC: 5.3 10*3/uL (ref 4.0–10.5)

## 2021-03-22 LAB — COMPREHENSIVE METABOLIC PANEL
ALT: 9 U/L (ref 0–35)
AST: 10 U/L (ref 0–37)
Albumin: 3.7 g/dL (ref 3.5–5.2)
Alkaline Phosphatase: 36 U/L — ABNORMAL LOW (ref 39–117)
BUN: 12 mg/dL (ref 6–23)
CO2: 23 mEq/L (ref 19–32)
Calcium: 8.6 mg/dL (ref 8.4–10.5)
Chloride: 109 mEq/L (ref 96–112)
Creatinine, Ser: 0.7 mg/dL (ref 0.40–1.20)
GFR: 112.1 mL/min (ref >60.00)
Glucose, Bld: 76 mg/dL (ref 70–99)
Potassium: 4 mEq/L (ref 3.5–5.1)
Sodium: 138 mEq/L (ref 135–145)
Total Bilirubin: 0.5 mg/dL (ref 0.2–1.2)
Total Protein: 6.5 g/dL (ref 6.0–8.3)

## 2021-03-22 LAB — LIPASE: Lipase: 18 U/L (ref 11.0–59.0)

## 2021-03-22 MED ORDER — TOPIRAMATE 25 MG PO TABS: 75.0000 mg | ORAL_TABLET | Freq: Every day | ORAL | 0 refills | Status: DC

## 2021-03-22 NOTE — Assessment & Plan Note (Signed)
Gastritis vs PUD vs pancreatitis vs gall bladder disease.  Eval with labs.   Likely gastritis and GERD secondary to over use of ASA and NSAIDs in setting of poorly control migraine.   Stop OTC meds, avoid triggers an acidic foods.  Start PPI with plan to taper or escalate depending on response at 2 weeks.

## 2021-03-22 NOTE — Progress Notes (Signed)
Patient ID: Tiffany Donovan, female    DOB: 08/09/1985, 35 y.o.   MRN: [id number]  This visit was conducted in person.  BP 90/60   Pulse 79   Temp 98.4 F (36.9 C) (Temporal)   Ht 5' 2.5" (1.588 m)   Wt 210 lb 8 oz (95.5 kg)   LMP 02/28/2021   SpO2 99%   BMI 37.89 kg/m    CC: Chief Complaint  Patient presents with   Abdominal Pain    When eats and drinks and in the mornings when she wakes up    Subjective:   HPI: Tiffany Donovan is a 35 y.o. female presenting on 03/22/2021 for Abdominal Pain (When eats and drinks and in the mornings when she wakes up)  She has recently been struggling with increase in migraines ( now on 75 mg topiramate for prevention).. She is using prn naproxen ( has taken 2 in last week)  She is using imitrex , excedrine migraine , advil migraine , ibuprofen for headache.. all fairly regularly for daily.  In last week she has noted intermittent epigastric pain with eating.  When wakes up in AM has epigastric pain.Marland Kitchen shapr and dull pain.  No N/V. No D/C.  No fever. No sick contacts. She has burning in throat daily.  She has tried pepto bismol does not help much.     Relevant past medical, surgical, family and social history reviewed and updated as indicated. Interim medical history since our last visit reviewed. Allergies and medications reviewed and updated. Outpatient Medications Prior to Visit  Medication Sig Dispense Refill   buPROPion (WELLBUTRIN SR) 150 MG 12 hr tablet TAKE 1 TABLET (150 MG TOTAL) BY MOUTH 2 (TWO) TIMES DAILY IN THE MORNING AND 4PM 180 tablet 1   naproxen (NAPROSYN) 500 MG tablet Take 500 mg by mouth 2 (two) times daily as needed.     SUMAtriptan (IMITREX) 100 MG tablet TAKE 1 TABLET (100 MG TOTAL) BY MOUTH EVERY 2 (TWO) HOURS AS NEEDED FOR MIGRAINE. MAY REPEAT IN 2 HOURS IF HEADACHE PERSISTS OR RECURS. 10 tablet 0   topiramate (TOPAMAX) 25 MG tablet TAKE 2 TABLETS BY MOUTH AT BEDTIME (Patient taking differently: Take 75 mg by  mouth daily.) 180 tablet 0   predniSONE (STERAPRED UNI-PAK 21 TAB) 10 MG (21) TBPK tablet See admin instructions. follow package directions     Facility-Administered Medications Prior to Visit  Medication Dose Route Frequency Provider Last Rate Last Admin   gadopentetate dimeglumine (MAGNEVIST) injection 20 mL  20 mL Intravenous Once PRN Melvenia Beam, MD         Per HPI unless specifically indicated in ROS section below Review of Systems  Constitutional:  Negative for fatigue and fever.  HENT:  Negative for ear pain.   Eyes:  Negative for pain.  Respiratory:  Negative for chest tightness and shortness of breath.   Cardiovascular:  Negative for chest pain, palpitations and leg swelling.  Gastrointestinal:  Positive for abdominal pain. Negative for abdominal distention, blood in stool, constipation, diarrhea, rectal pain and vomiting.  Genitourinary:  Negative for dysuria.  Objective:  BP 90/60   Pulse 79   Temp 98.4 F (36.9 C) (Temporal)   Ht 5' 2.5" (1.588 m)   Wt 210 lb 8 oz (95.5 kg)   LMP 02/28/2021   SpO2 99%   BMI 37.89 kg/m   Wt Readings from Last 3 Encounters:  03/22/21 210 lb 8 oz (95.5 kg)  03/10/21 210 lb  4 oz (95.4 kg)  09/09/20 214 lb (97.1 kg)      Physical Exam Constitutional:      General: She is not in acute distress.    Appearance: Normal appearance. She is well-developed. She is not ill-appearing or toxic-appearing.  HENT:     Head: Normocephalic.     Right Ear: Hearing, tympanic membrane, ear canal and external ear normal. Tympanic membrane is not erythematous, retracted or bulging.     Left Ear: Hearing, tympanic membrane, ear canal and external ear normal. Tympanic membrane is not erythematous, retracted or bulging.     Nose: No mucosal edema or rhinorrhea.     Right Sinus: No maxillary sinus tenderness or frontal sinus tenderness.     Left Sinus: No maxillary sinus tenderness or frontal sinus tenderness.     Mouth/Throat:     Pharynx: Uvula  midline.  Eyes:     General: Lids are normal. Lids are everted, no foreign bodies appreciated.     Conjunctiva/sclera: Conjunctivae normal.     Pupils: Pupils are equal, round, and reactive to light.  Neck:     Thyroid: No thyroid mass or thyromegaly.     Vascular: No carotid bruit.     Trachea: Trachea normal.  Cardiovascular:     Rate and Rhythm: Normal rate and regular rhythm.     Pulses: Normal pulses.     Heart sounds: Normal heart sounds, S1 normal and S2 normal. No murmur heard.   No friction rub. No gallop.  Pulmonary:     Effort: Pulmonary effort is normal. No tachypnea or respiratory distress.     Breath sounds: Normal breath sounds. No decreased breath sounds, wheezing, rhonchi or rales.  Abdominal:     General: Bowel sounds are normal.     Palpations: Abdomen is soft.     Tenderness: There is abdominal tenderness in the epigastric area. There is no right CVA tenderness, left CVA tenderness, guarding or rebound.  Musculoskeletal:     Cervical back: Normal range of motion and neck supple.  Skin:    General: Skin is warm and dry.     Findings: No rash.  Neurological:     Mental Status: She is alert.  Psychiatric:        Mood and Affect: Mood is not anxious or depressed.        Speech: Speech normal.        Behavior: Behavior normal. Behavior is cooperative.        Thought Content: Thought content normal.        Judgment: Judgment normal.      Results for orders placed or performed in visit on 09/09/20  CBC with Differential/Platelet  Result Value Ref Range   WBC 4.9 4.0 - 10.5 K/uL   RBC 4.27 3.87 - 5.11 Mil/uL   Hemoglobin 12.9 12.0 - 15.0 g/dL   HCT 38.0 36.0 - 46.0 %   MCV 89.1 78.0 - 100.0 fl   MCHC 34.0 30.0 - 36.0 g/dL   RDW 13.0 11.5 - 15.5 %   Platelets 288.0 150.0 - 400.0 K/uL   Neutrophils Relative % 62.8 43.0 - 77.0 %   Lymphocytes Relative 24.5 12.0 - 46.0 %   Monocytes Relative 5.6 3.0 - 12.0 %   Eosinophils Relative 6.2 (H) 0.0 - 5.0 %    Basophils Relative 0.9 0.0 - 3.0 %   Neutro Abs 3.1 1.4 - 7.7 K/uL   Lymphs Abs 1.2 0.7 - 4.0 K/uL   Monocytes  Absolute 0.3 0.1 - 1.0 K/uL   Eosinophils Absolute 0.3 0.0 - 0.7 K/uL   Basophils Absolute 0.0 0.0 - 0.1 K/uL    This visit occurred during the SARS-CoV-2 public health emergency.  Safety protocols were in place, including screening questions prior to the visit, additional usage of staff PPE, and extensive cleaning of exam room while observing appropriate contact time as indicated for disinfecting solutions.   COVID 19 screen:  No recent travel or known exposure to COVID19 The patient denies respiratory symptoms of COVID 19 at this time. The importance of social distancing was discussed today.   Assessment and Plan Problem List Items Addressed This Visit     Epigastric pain - Primary     Gastritis vs PUD vs pancreatitis vs gall bladder disease.  Eval with labs.   Likely gastritis and GERD secondary to over use of ASA and NSAIDs in setting of poorly control migraine.   Stop OTC meds, avoid triggers an acidic foods.  Start PPI with plan to taper or escalate depending on response at 2 weeks.      Relevant Orders   Comprehensive metabolic panel   Lipase   CBC with Differential/Platelet   Migraine without aura    Tolerating topamax well.. refilled at higher dose.  S top OTC meds as contributing to likely rebound headache.  Will likely need referral to neuro.. pt will let me know when ready.      Relevant Medications   topiramate (TOPAMAX) 25 MG tablet   Other Visit Diagnoses     Gastroesophageal reflux disease, unspecified whether esophagitis present              Eliezer Lofts, MD

## 2021-03-22 NOTE — Assessment & Plan Note (Signed)
Tolerating topamax well.. refilled at higher dose.  S top OTC meds as contributing to likely rebound headache.  Will likely need referral to neuro.. pt will let me know when ready.

## 2021-03-22 NOTE — Patient Instructions (Addendum)
Stop all  ibuprofen, advil, naprosyn and Excedrin.  Avoid spicy food, citrus, soda, caffeine, peppermint, tomato.  Start prilosec 2  x 20 mg daily x 2-4 week.. if not improving after 2 weeks call.  Please stop at the lab to have labs drawn.

## 2021-03-31 ENCOUNTER — Other Ambulatory Visit: Payer: Self-pay | Admitting: Family Medicine

## 2021-03-31 NOTE — Telephone Encounter (Signed)
Ok to refill Imitrex?  Patient currently on higher dose of Topamax.  Just wanted to make sure ok for patient to take both.

## 2021-04-27 ENCOUNTER — Other Ambulatory Visit: Payer: Self-pay | Admitting: Family Medicine

## 2021-04-30 ENCOUNTER — Other Ambulatory Visit: Payer: Self-pay | Admitting: Family Medicine

## 2021-05-04 NOTE — Telephone Encounter (Signed)
Last OV - 03/22/2021 Next OV - N/A Last Filled - 03/31/2021

## 2021-06-09 ENCOUNTER — Other Ambulatory Visit: Payer: Self-pay | Admitting: Family Medicine

## 2021-06-09 MED ORDER — DICLOFENAC SODIUM 75 MG PO TBEC: 75.0000 mg | DELAYED_RELEASE_TABLET | Freq: Two times a day (BID) | ORAL | 0 refills | Status: DC

## 2021-06-10 ENCOUNTER — Other Ambulatory Visit: Payer: Self-pay | Admitting: Family Medicine

## 2021-06-10 DIAGNOSIS — G8929 Other chronic pain: Secondary | ICD-10-CM

## 2021-06-10 MED ORDER — TRAMADOL HCL 50 MG PO TABS: 50.0000 mg | ORAL_TABLET | Freq: Three times a day (TID) | ORAL | 0 refills | Status: AC | PRN

## 2021-06-13 ENCOUNTER — Other Ambulatory Visit: Payer: Self-pay | Admitting: Family Medicine

## 2021-06-28 ENCOUNTER — Other Ambulatory Visit: Payer: Self-pay | Admitting: Family Medicine

## 2021-07-05 MED ORDER — TRAMADOL HCL 50 MG PO TABS: 50.0000 mg | ORAL_TABLET | Freq: Three times a day (TID) | ORAL | 0 refills | Status: AC | PRN

## 2021-08-02 ENCOUNTER — Other Ambulatory Visit: Payer: Self-pay | Admitting: Cardiology

## 2021-08-02 DIAGNOSIS — Z6841 Body Mass Index (BMI) 40.0 and over, adult: Secondary | ICD-10-CM

## 2021-08-02 DIAGNOSIS — F418 Other specified anxiety disorders: Secondary | ICD-10-CM

## 2021-08-10 ENCOUNTER — Encounter: Payer: Self-pay | Admitting: Family Medicine

## 2021-08-10 ENCOUNTER — Other Ambulatory Visit: Payer: Self-pay

## 2021-08-10 ENCOUNTER — Other Ambulatory Visit (HOSPITAL_COMMUNITY)
Admission: RE | Admit: 2021-08-10 | Discharge: 2021-08-10 | Disposition: A | Discharge status: Home / Self Care | Payer: BLUE CROSS/BLUE SHIELD | Source: Ambulatory Visit | Attending: Family Medicine | Admitting: Family Medicine

## 2021-08-10 ENCOUNTER — Ambulatory Visit: Payer: BLUE CROSS/BLUE SHIELD | Admitting: Family Medicine

## 2021-08-10 VITALS — BP 112/71 | HR 67 | Ht 62.5 in | Wt 215.0 lb

## 2021-08-10 DIAGNOSIS — R102 Pelvic and perineal pain: Secondary | ICD-10-CM | POA: Diagnosis not present

## 2021-08-10 DIAGNOSIS — Z124 Encounter for screening for malignant neoplasm of cervix: Secondary | ICD-10-CM | POA: Insufficient documentation

## 2021-08-10 DIAGNOSIS — G43001 Migraine without aura, not intractable, with status migrainosus: Secondary | ICD-10-CM

## 2021-08-10 MED ORDER — DOXYCYCLINE HYCLATE 100 MG PO CAPS: 100.0000 mg | ORAL_CAPSULE | Freq: Two times a day (BID) | ORAL | 0 refills | Status: DC

## 2021-08-10 NOTE — Progress Notes (Signed)
Pt states pelvic pain is constant but worsens during her period and after intercourse. Pt states period has become heavier.  Last pap 08/17/16- normal

## 2021-08-10 NOTE — Progress Notes (Signed)
° °  Subjective:    Patient ID: Tiffany Donovan is a 36 y.o. female presenting with Pelvic Pain  on 08/10/2021  HPI: New patient today, has pelvic pain. Has low pelvic pain and pressure since Late August and September. Notes pain worsens with her cycles. Radiates to her back. Notes pain after intercourse. Noted first time in 2020 and then it went away and now has been back. Noted to be cramping. Seems to be midline and then also involves both sides. Worse with movement at times as well. Taking tylenol and not helping much. Has had tramadol twice and this did not help. Cycles are heavy, monthly, regular. Previous pelvic sonogram showed 2 small fibroid and resolution of ovarian cyst.  Review of Systems  Constitutional:  Negative for chills and fever.  Respiratory:  Negative for shortness of breath.   Cardiovascular:  Negative for chest pain.  Gastrointestinal:  Negative for abdominal pain, nausea and vomiting.  Genitourinary:  Positive for pelvic pain and vaginal bleeding. Negative for dysuria.  Skin:  Negative for rash.     Objective:   Chaperone present for exam  BP 112/71    Pulse 67    Ht 5' 2.5" (1.588 m)    Wt 215 lb (97.5 kg)    LMP 08/02/2021    BMI 38.70 kg/m  Physical Exam Constitutional:      General: She is not in acute distress.    Appearance: She is well-developed.  HENT:     Head: Normocephalic and atraumatic.  Eyes:     General: No scleral icterus. Cardiovascular:     Rate and Rhythm: Normal rate.  Pulmonary:     Effort: Pulmonary effort is normal.  Abdominal:     Palpations: Abdomen is soft.  Genitourinary:    Comments: BUS normal, vagina is pink and rugated, cervix is parous with prominent ectropion, uterus is small and anteverted and moderately tender, no adnexal mass or tenderness.  Musculoskeletal:     Cervical back: Neck supple.  Skin:    General: Skin is warm and dry.  Neurological:     Mental Status: She is alert and oriented to person, place, and time.         Assessment & Plan:   Problem List Items Addressed This Visit       Unprioritized   Migraine without aura    To see Santiago Glad      Pelvic pain    Check u/s and ensure there is not a degenerating fibroid or ovarian mass. Trial of doxycycline due to uterine tenderness. If all else is negative, discussed option of presumptive treatment of endometriosis/adenomyosis  with COCs or IUD or the like vs. More definitive therapy if needed.       Relevant Medications   doxycycline (VIBRAMYCIN) 100 MG capsule   Other Relevant Orders   US PELVIC COMPLETE WITH TRANSVAGINAL   Other Visit Diagnoses     Screening for cervical cancer    -  Primary   Relevant Orders   Cytology - PAP( [facility])       Return in about 4 weeks (around 09/07/2021) for consider seeing Santiago Glad for headaches, a follow-up.  Donnamae Jude, MD 08/10/2021 9:31 AM

## 2021-08-10 NOTE — Assessment & Plan Note (Addendum)
Check u/s and ensure there is not a degenerating fibroid or ovarian mass. Trial of doxycycline due to uterine tenderness. If all else is negative, discussed option of presumptive treatment of endometriosis/adenomyosis  with COCs or IUD or the like vs. More definitive therapy if needed.

## 2021-08-10 NOTE — Assessment & Plan Note (Signed)
To see Santiago Glad

## 2021-08-15 ENCOUNTER — Encounter: Payer: Self-pay | Admitting: Family Medicine

## 2021-08-15 LAB — CYTOLOGY - PAP
Comment: NEGATIVE
Comment: NEGATIVE
Diagnosis: UNDETERMINED — AB
HPV 16: NEGATIVE
HPV 18 / 45: NEGATIVE
High risk HPV: POSITIVE — AB

## 2021-08-16 ENCOUNTER — Inpatient Hospital Stay: Admission: RE | Admit: 2021-08-16 | Payer: BLUE CROSS/BLUE SHIELD | Source: Ambulatory Visit

## 2021-08-24 ENCOUNTER — Ambulatory Visit: Admission: RE | Admit: 2021-08-24 | Payer: BLUE CROSS/BLUE SHIELD | Source: Ambulatory Visit

## 2021-08-25 ENCOUNTER — Other Ambulatory Visit: Payer: Self-pay | Admitting: Cardiology

## 2021-08-25 ENCOUNTER — Encounter: Payer: Self-pay | Admitting: Family Medicine

## 2021-08-25 DIAGNOSIS — Z6841 Body Mass Index (BMI) 40.0 and over, adult: Secondary | ICD-10-CM

## 2021-08-25 DIAGNOSIS — F418 Other specified anxiety disorders: Secondary | ICD-10-CM

## 2021-08-25 MED ORDER — BUPROPION HCL ER (SR) 150 MG PO TB12: ORAL_TABLET | ORAL | 1 refills | Status: DC

## 2021-08-25 NOTE — Telephone Encounter (Signed)
Last office visit 03/22/2021 for Epigastric Pain.  Normally prescribed by her cardiologist. Faythe Ghee to refill?

## 2021-09-01 ENCOUNTER — Ambulatory Visit: Admission: RE | Admit: 2021-09-01 | Payer: BLUE CROSS/BLUE SHIELD | Source: Ambulatory Visit

## 2021-09-02 ENCOUNTER — Encounter: Payer: Self-pay | Admitting: Family Medicine

## 2021-09-06 ENCOUNTER — Ambulatory Visit: Payer: BLUE CROSS/BLUE SHIELD | Admitting: Family Medicine

## 2021-09-12 DIAGNOSIS — Z0279 Encounter for issue of other medical certificate: Secondary | ICD-10-CM

## 2021-09-12 NOTE — Telephone Encounter (Signed)
Printed and placed in Dr. Rometta Emery box  Pt needs this done asap

## 2021-09-12 NOTE — Telephone Encounter (Signed)
Sent to [facility] to print

## 2021-09-13 ENCOUNTER — Other Ambulatory Visit: Payer: Self-pay

## 2021-09-13 ENCOUNTER — Encounter (HOSPITAL_COMMUNITY): Payer: Self-pay | Admitting: *Deleted

## 2021-09-13 ENCOUNTER — Emergency Department (HOSPITAL_COMMUNITY)
Admission: EM | Admit: 2021-09-13 | Discharge: 2021-09-14 | Disposition: A | Discharge status: Home / Self Care | Payer: BLUE CROSS/BLUE SHIELD | Attending: Emergency Medicine | Admitting: Emergency Medicine

## 2021-09-13 DIAGNOSIS — K5792 Diverticulitis of intestine, part unspecified, without perforation or abscess without bleeding: Secondary | ICD-10-CM | POA: Insufficient documentation

## 2021-09-13 DIAGNOSIS — R1032 Left lower quadrant pain: Secondary | ICD-10-CM | POA: Diagnosis present

## 2021-09-13 DIAGNOSIS — Z20822 Contact with and (suspected) exposure to covid-19: Secondary | ICD-10-CM | POA: Diagnosis not present

## 2021-09-13 LAB — CBC WITH DIFFERENTIAL/PLATELET
Abs Immature Granulocytes: 0.04 10*3/uL (ref 0.00–0.07)
Basophils Absolute: 0 10*3/uL (ref 0.0–0.1)
Basophils Relative: 0 %
Eosinophils Absolute: 0.1 10*3/uL (ref 0.0–0.5)
Eosinophils Relative: 1 %
HCT: 38.7 % (ref 36.0–46.0)
Hemoglobin: 13.2 g/dL (ref 12.0–15.0)
Immature Granulocytes: 0 %
Lymphocytes Relative: 11 %
Lymphs Abs: 1.3 10*3/uL (ref 0.7–4.0)
MCH: 30.6 pg (ref 26.0–34.0)
MCHC: 34.1 g/dL (ref 30.0–36.0)
MCV: 89.8 fL (ref 80.0–100.0)
Monocytes Absolute: 0.7 10*3/uL (ref 0.1–1.0)
Monocytes Relative: 6 %
Neutro Abs: 9.3 10*3/uL — ABNORMAL HIGH (ref 1.7–7.7)
Neutrophils Relative %: 82 %
Platelets: 277 10*3/uL (ref 150–400)
RBC: 4.31 MIL/uL (ref 3.87–5.11)
RDW: 12.7 % (ref 11.5–15.5)
WBC: 11.4 10*3/uL — ABNORMAL HIGH (ref 4.0–10.5)
nRBC: 0 % (ref 0.0–0.2)

## 2021-09-13 LAB — COMPREHENSIVE METABOLIC PANEL
ALT: 27 U/L (ref 0–44)
AST: 22 U/L (ref 15–41)
Albumin: 4 g/dL (ref 3.5–5.0)
Alkaline Phosphatase: 51 U/L (ref 38–126)
Anion gap: 6 (ref 5–15)
BUN: 12 mg/dL (ref 6–20)
CO2: 22 mmol/L (ref 22–32)
Calcium: 8.6 mg/dL — ABNORMAL LOW (ref 8.9–10.3)
Chloride: 107 mmol/L (ref 98–111)
Creatinine, Ser: 0.77 mg/dL (ref 0.44–1.00)
GFR, Estimated: >60 mL/min (ref >60)
Glucose, Bld: 98 mg/dL (ref 70–99)
Potassium: 3.7 mmol/L (ref 3.5–5.1)
Sodium: 135 mmol/L (ref 135–145)
Total Bilirubin: 0.7 mg/dL (ref 0.3–1.2)
Total Protein: 7.3 g/dL (ref 6.5–8.1)

## 2021-09-13 LAB — LIPASE, BLOOD: Lipase: 26 U/L (ref 11–51)

## 2021-09-13 MED ORDER — ONDANSETRON HCL 4 MG/2ML IJ SOLN
4.0000 mg | Freq: Once | INTRAMUSCULAR | Status: AC
  Administered 2021-09-14: 4 mg via INTRAVENOUS
  Filled 2021-09-13: qty 2

## 2021-09-13 MED ORDER — SODIUM CHLORIDE 0.9 % IV BOLUS
1000.0000 mL | Freq: Once | INTRAVENOUS | Status: AC
  Administered 2021-09-14: 1000 mL via INTRAVENOUS

## 2021-09-13 MED ORDER — HYDROMORPHONE HCL 1 MG/ML IJ SOLN
1.0000 mg | Freq: Once | INTRAMUSCULAR | Status: AC
  Administered 2021-09-14: 1 mg via INTRAVENOUS
  Filled 2021-09-13: qty 1

## 2021-09-13 NOTE — ED Provider Triage Note (Signed)
Emergency Medicine Provider Triage Evaluation Note  NANETTA WIEGMAN , a 36 y.o. female  was evaluated in triage.  Pt complains of abd pain. Generalized left abdomen. No radiation. Normal BM today without melena or BRBPR. No flank pain, dysuria, hematuria. No pelvic pain, vag dc. Denies change of pregnancy. Had "fever" today to 99.8 PTA. Some myalgias however no sore throat, congestion, cough, sob, sick contacts  Review of Systems  Positive: Abd pain Negative: Emesis, change in stool, dysuria  Physical Exam  There were no vitals taken for this visit. Gen:   Awake, no distress   Resp:  Normal effort  MSK:   Moves extremities without difficulty  Other:    Medical Decision Making  Medically screening exam initiated at 10:40 PM.  Appropriate orders placed.  SHASTINA RUA was informed that the remainder of the evaluation will be completed by another provider, this initial triage assessment does not replace that evaluation, and the importance of remaining in the ED until their evaluation is complete.  Abd pain   Destin Vinsant A, PA-C 09/13/21 [id number]

## 2021-09-13 NOTE — ED Triage Notes (Signed)
Left abd pain started yesterday, denies N/V/D. Fever started this evening. No Bowel or bladder discomfort.

## 2021-09-13 NOTE — Telephone Encounter (Signed)
FMLA paperwork faxed to [employer] at [phone].

## 2021-09-13 NOTE — ED Provider Notes (Signed)
[facility] Emergency Department Provider Note MRN:  [id number]  Arrival date & time: 09/14/21     Chief Complaint   Fever and Abdominal Pain   History of Present Illness   Tiffany Donovan is a 36 y.o. year-old female presents to the ED with chief complaint of left lower quadrant abdominal pain x2 days.  Patient reports normal bowel movements.  Denies any dysuria, hematuria, vaginal discharge, or bleeding.  She denies any successful treatments prior to arrival.  She reports gradually worsening symptoms.  History provided by patient.  Review of Systems  Pertinent review of systems noted in HPI.    Physical Exam   Vitals:   09/14/21 0130 09/14/21 0200  BP: 107/62 102/63  Pulse: 82 77  Resp: 20 18  Temp:    SpO2: 96% 100%    CONSTITUTIONAL:  non-toxic-appearing, NAD NEURO:  Alert and oriented x 3, CN 3-12 grossly intact EYES:  eyes equal and reactive ENT/NECK:  Supple, no stridor  CARDIO:  normal rate, regular rhythm, appears well-perfused  PULM:  No respiratory distress, CTAB GI/GU:  non-distended, LLQ tenderness to palpation MSK/SPINE:  No gross deformities, no edema, moves all extremities  SKIN:  no rash, atraumatic   *Additional and/or pertinent findings included in MDM below  Diagnostic and Interventional Summary    EKG Interpretation  Date/Time:    Ventricular Rate:    PR Interval:    QRS Duration:   QT Interval:    QTC Calculation:   R Axis:     Text Interpretation:         Labs Reviewed  CBC WITH DIFFERENTIAL/PLATELET - Abnormal; Notable for the following components:      Result Value   WBC 11.4 (*)    Neutro Abs 9.3 (*)    All other components within normal limits  COMPREHENSIVE METABOLIC PANEL - Abnormal; Notable for the following components:   Calcium 8.6 (*)    All other components within normal limits  RESP PANEL BY RT-PCR (FLU A&B, COVID) ARPGX2  LIPASE, BLOOD  LACTIC ACID, PLASMA  URINALYSIS, ROUTINE W REFLEX  MICROSCOPIC  I-STAT BETA HCG BLOOD, ED ([facility], [facility], [facility] ONLY)    CT ABDOMEN PELVIS W CONTRAST  Final Result      Medications  Ampicillin-Sulbactam (UNASYN) 3 g in sodium chloride 0.9 % 100 mL IVPB (3 g Intravenous New Bag/Given 09/14/21 0153)  HYDROmorphone (DILAUDID) injection 1 mg (1 mg Intravenous Given 09/14/21 0002)  ondansetron (ZOFRAN) injection 4 mg (4 mg Intravenous Given 09/14/21 0003)  sodium chloride 0.9 % bolus 1,000 mL (0 mLs Intravenous Stopped 09/14/21 0141)  iohexol (OMNIPAQUE) 300 MG/ML solution 100 mL (100 mLs Intravenous Contrast Given 09/14/21 0106)  HYDROcodone-acetaminophen (NORCO/VICODIN) 5-325 MG per tablet 2 tablet (2 tablets Oral Given 09/14/21 0153)     Procedures  /  Critical Care Procedures  ED Course and Medical Decision Making  I have reviewed the triage vital signs, the nursing notes, and pertinent available records from the EMR.  Complexity of Problems Addressed: High Complexity: Acute illness/injury posing a threat to life or bodily function, requiring emergent diagnostic workup, evaluation, and treatment as below.  ED Course:    After considering the following differential, diverticulitis, ovarian torsion, TOA, ovarian cyst, I ordered CT abd/pel, pain meds, fluids, and agree with additional orders placed in triage.  I personally interpreted the labs which are notable for mild leukocytosis .   CT shows evidence of diverticulitis as well as uterine fibroid.  Social Determinants  Affecting Care:     Consultants: No consultations were needed in caring for this patient.   Treatment and Plan:  We will treat diverticulitis with Augmentin.  Patient was given a dose of Unasyn in the ED prior to discharge.  Return precautions discussed.  I considered admission due to patient's initial presentation, but after considering the examination and diagnostic results, patient will not require admission and can be discharged with outpatient follow-up.  Patient  discussed with attending physician, Dr. Dina Rich, who agrees with treatment plan with Augmentin.  Final Clinical Impressions(s) / ED Diagnoses     ICD-10-CM   1. Diverticulitis  K57.92       ED Discharge Orders          Ordered    HYDROcodone-acetaminophen (NORCO/VICODIN) 5-325 MG tablet  Every 6 hours PRN        09/14/21 0143    ondansetron (ZOFRAN-ODT) 4 MG disintegrating tablet  Every 8 hours PRN        09/14/21 0143    amoxicillin-clavulanate (AUGMENTIN) 875-125 MG tablet  Every 12 hours        09/14/21 0143             Discharge Instructions Discussed with and Provided to Patient:     Discharge Instructions      Your CT scan was notable for diverticulitis.  And also had an incidental finding of fibroid uterus.  Please follow-up with your OB/GYN to discuss your uterine fibroids.  Please take antibiotics and pain medication as directed.  Return for new or worsening symptoms.       Montine Circle, PA-C 09/14/21 2103    Merryl Hacker, MD 09/14/21 0500

## 2021-09-14 ENCOUNTER — Telehealth (HOSPITAL_COMMUNITY): Payer: Self-pay | Admitting: Emergency Medicine

## 2021-09-14 ENCOUNTER — Encounter (HOSPITAL_COMMUNITY): Payer: Self-pay

## 2021-09-14 ENCOUNTER — Emergency Department (HOSPITAL_COMMUNITY): Payer: BLUE CROSS/BLUE SHIELD

## 2021-09-14 LAB — I-STAT BETA HCG BLOOD, ED (MC, WL, AP ONLY): I-stat hCG, quantitative: <5 m[IU]/mL (ref <5)

## 2021-09-14 LAB — LACTIC ACID, PLASMA: Lactic Acid, Venous: 0.5 mmol/L (ref 0.5–1.9)

## 2021-09-14 LAB — RESP PANEL BY RT-PCR (FLU A&B, COVID) ARPGX2
Influenza A by PCR: NEGATIVE
Influenza B by PCR: NEGATIVE
SARS Coronavirus 2 by RT PCR: NEGATIVE

## 2021-09-14 MED ORDER — SODIUM CHLORIDE 0.9 % IV SOLN
3.0000 g | Freq: Once | INTRAVENOUS | Status: AC
  Administered 2021-09-14: 3 g via INTRAVENOUS
  Filled 2021-09-14: qty 8

## 2021-09-14 MED ORDER — AMOXICILLIN-POT CLAVULANATE 875-125 MG PO TABS: 1.0000 | ORAL_TABLET | Freq: Two times a day (BID) | ORAL | 0 refills | Status: DC

## 2021-09-14 MED ORDER — HYDROCODONE-ACETAMINOPHEN 5-325 MG PO TABS: 1.0000 | ORAL_TABLET | Freq: Four times a day (QID) | ORAL | 0 refills | Status: DC | PRN

## 2021-09-14 MED ORDER — OXYCODONE-ACETAMINOPHEN 5-325 MG PO TABS: 1.0000 | ORAL_TABLET | Freq: Four times a day (QID) | ORAL | 0 refills | Status: DC | PRN

## 2021-09-14 MED ORDER — IOHEXOL 300 MG/ML  SOLN
100.0000 mL | Freq: Once | INTRAMUSCULAR | Status: AC | PRN
  Administered 2021-09-14: 100 mL via INTRAVENOUS

## 2021-09-14 MED ORDER — ONDANSETRON 4 MG PO TBDP: 4.0000 mg | ORAL_TABLET | Freq: Three times a day (TID) | ORAL | 0 refills | Status: DC | PRN

## 2021-09-14 MED ORDER — HYDROCODONE-ACETAMINOPHEN 5-325 MG PO TABS
2.0000 | ORAL_TABLET | Freq: Once | ORAL | Status: AC
  Administered 2021-09-14: 2 via ORAL
  Filled 2021-09-14: qty 2

## 2021-09-14 NOTE — Telephone Encounter (Signed)
Received a phone call from [facility] regarding the patient's Norco prescription.  The [facility] pharmacy was pharmacy [phone] on [address]. In [place], [place].  A prescription for Norco was ordered, however they are currently on backorder and have not in stock.  I reviewed the patient's visit.  Opiates are indicated.  Will prescribe Percocet as an alternative.

## 2021-09-14 NOTE — Discharge Instructions (Addendum)
Your CT scan was notable for diverticulitis.  And also had an incidental finding of fibroid uterus.  Please follow-up with your OB/GYN to discuss your uterine fibroids.  Please take antibiotics and pain medication as directed.  Return for new or worsening symptoms.

## 2021-09-14 NOTE — Addendum Note (Signed)
Addended by: Regan Lemming on: 09/14/2021 10:18 AM   Modules accepted: Orders

## 2021-09-22 ENCOUNTER — Ambulatory Visit
Admission: RE | Admit: 2021-09-22 | Discharge: 2021-09-22 | Disposition: A | Discharge status: Home / Self Care | Payer: BLUE CROSS/BLUE SHIELD | Source: Ambulatory Visit | Attending: Family Medicine | Admitting: Family Medicine

## 2021-09-22 ENCOUNTER — Other Ambulatory Visit: Payer: Self-pay

## 2021-09-22 ENCOUNTER — Encounter: Payer: Self-pay | Admitting: Family Medicine

## 2021-09-22 DIAGNOSIS — R102 Pelvic and perineal pain: Secondary | ICD-10-CM | POA: Diagnosis present

## 2021-09-23 ENCOUNTER — Ambulatory Visit: Payer: BLUE CROSS/BLUE SHIELD | Admitting: Family Medicine

## 2021-10-05 ENCOUNTER — Encounter: Payer: Self-pay | Admitting: Family Medicine

## 2021-10-05 ENCOUNTER — Ambulatory Visit: Payer: BLUE CROSS/BLUE SHIELD | Admitting: Family Medicine

## 2021-10-05 ENCOUNTER — Other Ambulatory Visit: Payer: Self-pay

## 2021-10-05 VITALS — BP 118/79 | HR 74 | Ht 62.5 in | Wt 214.4 lb

## 2021-10-05 DIAGNOSIS — R102 Pelvic and perineal pain: Secondary | ICD-10-CM | POA: Diagnosis not present

## 2021-10-05 DIAGNOSIS — N393 Stress incontinence (female) (male): Secondary | ICD-10-CM | POA: Diagnosis not present

## 2021-10-05 MED ORDER — SLYND 4 MG PO TABS: 4.0000 mg | ORAL_TABLET | Freq: Every day | ORAL | 11 refills | Status: DC

## 2021-10-05 NOTE — Assessment & Plan Note (Signed)
Given impact on life, may be interested in surgery--will refer to uro-GYN. ?

## 2021-10-05 NOTE — Assessment & Plan Note (Signed)
?   Related to fibroids vs. Other. Discussed all treatment options including meds, IUD, endoemtrial ablation, hysterectomy. Reviewed risks of all. Offered Slynd and pelvic PT. She will try [facility] and wait of pelvic PT. Will see how this does for pain management. ?

## 2021-10-05 NOTE — Progress Notes (Addendum)
? ?  Subjective:  ? ? Patient ID: Tiffany Donovan is a 36 y.o. female presenting with Follow-up ? on 10/05/2021 ? ?HPI: ?Has h/o pelvic pain (see note dated 08/10/21 for full details) and heavy menstrual bleeding and has had diverticulitis recently which also makes matters more confusing. U/s notes uterus with fibroids. ?Has painful intercourse. Treated presumptively for endometritis-->could not tolerate doxy, had throat swelling. Has been on COCs and IUD in the past.  Does not want to gain weight or have a side effect or loss of sex drive. ?Also reports loss of urine with sneezing, coughing, jumping. ? ? ?Review of Systems  ?Constitutional:  Negative for chills and fever.  ?Respiratory:  Negative for shortness of breath.   ?Cardiovascular:  Negative for chest pain.  ?Gastrointestinal:  Negative for abdominal pain, nausea and vomiting.  ?Genitourinary:  Negative for dysuria.  ?Skin:  Negative for rash.  ?   ?Objective:  ?  ?BP 118/79   Pulse 74   Ht 5' 2.5" (1.588 m)   Wt 214 lb 6.4 oz (97.3 kg)   BMI 38.59 kg/m?  ?Physical Exam ?Exam conducted with a chaperone present.  ?Constitutional:   ?   General: She is not in acute distress. ?   Appearance: She is well-developed.  ?HENT:  ?   Head: Normocephalic and atraumatic.  ?Eyes:  ?   General: No scleral icterus. ?Cardiovascular:  ?   Rate and Rhythm: Normal rate.  ?Pulmonary:  ?   Effort: Pulmonary effort is normal.  ?Abdominal:  ?   Palpations: Abdomen is soft.  ?Musculoskeletal:  ?   Cervical back: Neck supple.  ?Skin: ?   General: Skin is warm and dry.  ?Neurological:  ?   Mental Status: She is alert and oriented to person, place, and time.  ? ? ? ?   ?Assessment & Plan:  ? ?Problem List Items Addressed This Visit   ? ?  ? Unprioritized  ? Pelvic pain - Primary  ?  ? Related to fibroids vs. Other. Discussed all treatment options including meds, IUD, endoemtrial ablation, hysterectomy. Reviewed risks of all. Offered Slynd and pelvic PT. She will try [facility] and wait  of pelvic PT. Will see how this does for pain management. ?  ?  ? Relevant Medications  ? Drospirenone (SLYND) 4 MG TABS  ? Stress incontinence  ?  Given impact on life, may be interested in surgery--will refer to uro-GYN. ?  ?  ? Relevant Orders  ? Ambulatory referral to Urogynecology  ? ? ? ?Return in about 6 weeks (around 11/16/2021) for a follow-up, referral, virtual. ? ?Donnamae Jude, MD ?10/05/2021 ?9:46 AM ? ? ?

## 2021-10-21 ENCOUNTER — Encounter: Payer: BLUE CROSS/BLUE SHIELD | Admitting: Physician Assistant

## 2021-10-24 ENCOUNTER — Ambulatory Visit: Payer: BLUE CROSS/BLUE SHIELD | Admitting: Family Medicine

## 2021-11-16 ENCOUNTER — Encounter: Payer: Self-pay | Admitting: Family Medicine

## 2021-11-16 ENCOUNTER — Ambulatory Visit: Payer: BLUE CROSS/BLUE SHIELD | Admitting: Family Medicine

## 2021-11-16 NOTE — Progress Notes (Signed)
Patient did not keep appointment today. She may call to reschedule.  

## 2021-11-25 ENCOUNTER — Encounter (INDEPENDENT_AMBULATORY_CARE_PROVIDER_SITE_OTHER): Payer: BLUE CROSS/BLUE SHIELD | Admitting: Family Medicine

## 2021-11-25 DIAGNOSIS — R109 Unspecified abdominal pain: Secondary | ICD-10-CM

## 2021-11-25 DIAGNOSIS — G8929 Other chronic pain: Secondary | ICD-10-CM | POA: Diagnosis not present

## 2021-11-28 MED ORDER — PAROXETINE HCL 10 MG PO TABS: 10.0000 mg | ORAL_TABLET | Freq: Every day | ORAL | 3 refills | Status: DC

## 2021-11-28 NOTE — Progress Notes (Signed)
Please see the MyChart message reply(ies) for my assessment and plan. ? ?The patient gave consent for this Medical Advice Message and is aware that it may result in a bill to their insurance company as well as the possibility that this may result in a co-payment or deductible. They are an established patient, but are not seeking medical advice exclusively about a problem treated during an in person or video visit in the last 7 days. I did not recommend an in person or video visit within 7 days of my reply. ? ?I spent a total of 15 minutes cumulative time within 7 days through [employer] ?Eliezer Lofts, MD ? ?

## 2021-11-28 NOTE — Addendum Note (Signed)
Addended by: Eliezer Lofts E on: 11/28/2021 04:43 PM ? ? Modules accepted: Orders ? ?

## 2021-12-08 ENCOUNTER — Encounter: Payer: Self-pay | Admitting: Gastroenterology

## 2021-12-23 ENCOUNTER — Other Ambulatory Visit: Payer: Self-pay | Admitting: Family Medicine

## 2021-12-30 ENCOUNTER — Encounter (INDEPENDENT_AMBULATORY_CARE_PROVIDER_SITE_OTHER): Payer: BLUE CROSS/BLUE SHIELD | Admitting: Family Medicine

## 2021-12-30 DIAGNOSIS — F411 Generalized anxiety disorder: Secondary | ICD-10-CM

## 2022-01-02 MED ORDER — VENLAFAXINE HCL ER 37.5 MG PO CP24: ORAL_CAPSULE | ORAL | 3 refills | Status: DC

## 2022-01-02 NOTE — Addendum Note (Signed)
Addended by: Eliezer Lofts E on: 01/02/2022 06:11 PM   Modules accepted: Orders

## 2022-01-02 NOTE — Telephone Encounter (Signed)
Please see the MyChart message reply(ies) for my assessment and plan.  The patient gave consent for this Medical Advice Message and is aware that it may result in a bill to their insurance company as well as the possibility that this may result in a co-payment or deductible. They are an established patient, but are not seeking medical advice exclusively about a problem treated during an in person or video visit in the last 7 days. I did not recommend an in person or video visit within 7 days of my reply.  I spent a total of 15 minutes cumulative time within 7 days through [employer] Eliezer Lofts, MD

## 2022-01-03 ENCOUNTER — Other Ambulatory Visit: Payer: Self-pay | Admitting: Family Medicine

## 2022-01-03 ENCOUNTER — Telehealth: Payer: BLUE CROSS/BLUE SHIELD | Admitting: Family Medicine

## 2022-01-03 DIAGNOSIS — F321 Major depressive disorder, single episode, moderate: Secondary | ICD-10-CM

## 2022-01-03 DIAGNOSIS — F418 Other specified anxiety disorders: Secondary | ICD-10-CM

## 2022-01-03 MED ORDER — TOPIRAMATE 50 MG PO TABS: 150.0000 mg | ORAL_TABLET | Freq: Two times a day (BID) | ORAL | 3 refills | Status: DC

## 2022-01-03 NOTE — Addendum Note (Signed)
Addended byEliezer Lofts E on: 01/03/2022 01:11 PM   Modules accepted: Orders

## 2022-01-03 NOTE — Progress Notes (Signed)
See my chart message

## 2022-01-09 ENCOUNTER — Ambulatory Visit: Payer: BLUE CROSS/BLUE SHIELD | Admitting: Gastroenterology

## 2022-01-09 ENCOUNTER — Encounter: Payer: Self-pay | Admitting: Gastroenterology

## 2022-01-09 VITALS — BP 104/70 | HR 70 | Ht 63.0 in | Wt 212.5 lb

## 2022-01-09 DIAGNOSIS — R1032 Left lower quadrant pain: Secondary | ICD-10-CM

## 2022-01-09 DIAGNOSIS — R1012 Left upper quadrant pain: Secondary | ICD-10-CM | POA: Diagnosis not present

## 2022-01-09 DIAGNOSIS — K219 Gastro-esophageal reflux disease without esophagitis: Secondary | ICD-10-CM | POA: Diagnosis not present

## 2022-01-09 DIAGNOSIS — K5732 Diverticulitis of large intestine without perforation or abscess without bleeding: Secondary | ICD-10-CM

## 2022-01-09 MED ORDER — CLENPIQ 10-3.5-12 MG-GM -GM/175ML PO SOLN: 1.0000 | Freq: Once | ORAL | 0 refills | Status: DC

## 2022-01-09 MED ORDER — CLENPIQ 10-3.5-12 MG-GM -GM/175ML PO SOLN: 1.0000 | Freq: Once | ORAL | 0 refills | Status: AC

## 2022-01-09 NOTE — Progress Notes (Signed)
Chief Complaint: LLQ pain, history of diverticulitis   Referring Provider:     Jinny Sanders, MD   HPI:     Tiffany Donovan is a 36 y.o. female with a history of anxiety, depression, migraines, referred to the Gastroenterology Clinic for evaluation of LLQ pain and diverticulitis.   Was seen in the ER on 09/13/2021 for LLQ pain x2 days.  Diagnosed with diverticulitis by CT as below.  Was treated with Augmentin. - WBC 11.4, otherwise normal CBC, CMP, lipase, lactate  -09/14/2021: CT abdomen/pelvis: Normal liver, pancreas, spleen.  Acute diverticulitis in the descending/sigmoid.  Moderate formed stool in the proximal colon.  Multiple uterine fibroids, largest measuring 5.2 cm.  Small hiatal hernia.   Was seen in follow-up by Dr. Kennon Rounds in the GYN clinic.  Prescribed Drospirenone and pelvic PT for possible fibroid induced pelvic pain. - 09/22/2021: TVUS: Enlarged uterus with multiple fibroids, 1.9 cm complex cyst in right ovary may suggest hemorrhagic cyst/follicle.  Since acute diverticulitis in February, has had intermittent episodes of LLQ pain. Has been trying to avoid seeds, nuts, and red meat, but still with intermittent LUQ/LLQ pain. Can also have periumbilical aabdominal pain. Can occasional radiate to low back. No sxs prior to Feb. +nausea, but no emesis. Did have BRBPR 2 weeks ago; has not recurred.   Last time was this AM. Typically pain will last 60-90 minutes. Can improve with heating pad. Does have leftver Percocet fromER in Feb which she uses rarely for pain.    Separately, hx of GERD. Index sxs of HB, and treats with omeprazole prn. Was taking daily prior to Feb, but since decreasing PO intake, now just prn due to minimal reflux sxs. No dysphagia.   - 09/09/2020: Abdominal x-ray (indication: Constipation): Normal   No previous EGD or colonoscopy.  PGF with Colon Cancer.  Sister with diverticulitis, IBS No Fhx of IBD.   Past Medical History:  Diagnosis Date    Anxiety    Depression    Diverticulitis    Diverticulosis    Low back pain    Migraine headache    Obesity    Premenstrual dysphoric disorder      Past Surgical History:  Procedure Laterality Date   None     Family History  Problem Relation Age of Onset   Depression Mother    Diabetes Father    Kidney disease Father    Hypertension Father    Clotting disorder Father    Migraines Sister    Irritable bowel syndrome Sister    Migraines Sister    Diverticulosis Sister    Irritable bowel syndrome Sister    Depression Brother    Coronary artery disease Paternal Grandmother    Breast cancer Paternal Grandmother    Clotting disorder Paternal Grandmother    Coronary artery disease Paternal Grandfather    Colon cancer Paternal Grandfather    Cystic fibrosis Cousin        on mom side   Esophageal cancer Neg Hx    Social History   Tobacco Use   Smoking status: Never   Smokeless tobacco: Never  Vaping Use   Vaping Use: Some days  Substance Use Topics   Alcohol use: Yes    Alcohol/week: 0.0 standard drinks of alcohol    Comment: occasionally   Drug use: No   Current Outpatient Medications  Medication Sig Dispense Refill   buPROPion (WELLBUTRIN SR) 150 MG  12 hr tablet TAKE 1 TABLET (150 MG TOTAL) BY MOUTH 2 (TWO) TIMES DAILY IN THE MORNING AND 4PM 180 tablet 1   omeprazole (PRILOSEC OTC) 20 MG tablet Take 20 mg by mouth daily as needed (for acid reflux).     ondansetron (ZOFRAN-ODT) 4 MG disintegrating tablet Take 1 tablet (4 mg total) by mouth every 8 (eight) hours as needed for nausea or vomiting. 10 tablet 0   SUMAtriptan (IMITREX) 100 MG tablet TAKE 1 TABLET BY MOUTH EVERY 2 HOURS AS NEEDED FOR MIGRAINE. MAY REPEAT IN 2 HOURS IF HEADACHE PERSISTS OR RECURS. (Patient taking differently: Take 100 mg by mouth every 2 (two) hours as needed for migraine.) 10 tablet 2   topiramate (TOPAMAX) 50 MG tablet Take 3 tablets (150 mg total) by mouth 2 (two) times daily. 90 tablet 3    venlafaxine XR (EFFEXOR XR) 37.5 MG 24 hr capsule Start at 1 capsule daily, if tolerated increase to 2  capsules daily 60 capsule 3   naproxen (NAPROSYN) 500 MG tablet 1 tablet every 12 (twelve) hours as needed. (Patient not taking: Reported on 01/09/2022)     No current facility-administered medications for this visit.   No Known Allergies   Review of Systems: All systems reviewed and negative except where noted in HPI.     Physical Exam:    Wt Readings from Last 3 Encounters:  01/09/22 212 lb 8 oz (96.4 kg)  10/05/21 214 lb 6.4 oz (97.3 kg)  09/13/21 213 lb (96.6 kg)    BP 104/70   Pulse 70   Ht '5\' 3"'$  (1.6 m)   Wt 212 lb 8 oz (96.4 kg)   BMI 37.64 kg/m  Constitutional:  Pleasant, in no acute distress. Psychiatric: Normal mood and affect. Behavior is normal. Cardiovascular: Normal rate, regular rhythm. No edema Pulmonary/chest: Effort normal and breath sounds normal. No wheezing, rales or rhonchi. Abdominal: Soft, nondistended, nontender. Bowel sounds active throughout. There are no masses palpable. No hepatomegaly. Neurological: Alert and oriented to person place and time. Skin: Skin is warm and dry. No rashes noted.   ASSESSMENT AND PLAN;   1) Diverticultis 2) LLQ pain - Colonoscopy to evaluate for mucosal/luminal pathology to include IBD, SCAD, and ensure appropriate healing after recent acute diverticulitis - Continue avoiding any known exacerbating foods and keep diet diary to try to potentially identify exacerbating foods   3) GERD 4) LUQ pain - EGD to evaluate for erosive esophagitis, LES laxity, hiatal hernia along with evaluation gastritis, PUD given LUQ pain - Continue omeprazole on demand for the time being - Continue antireflux lifestyle/dietary modifications with avoidance of exacerbating foods   The indications, risks, and benefits of EGD and colonoscopy were explained to the patient in detail. Risks include but are not limited to bleeding,  perforation, adverse reaction to medications, and cardiopulmonary compromise. Sequelae include but are not limited to the possibility of surgery, hospitalization, and mortality. The patient verbalized understanding and wished to proceed. All questions answered, referred to scheduler and bowel prep ordered. Further recommendations pending results of the exam.     Lavena Bullion, DO, FACG  01/09/2022, 9:19 AM   Jinny Sanders, MD

## 2022-01-09 NOTE — Patient Instructions (Addendum)
If you are age 36 or younger, your body mass index should be between 19-25. Your Body mass index is 37.64 kg/m. If this is out of the aformentioned range listed, please consider follow up with your Primary Care Provider.   __________________________________________________________  The [facility] providers would like to encourage you to use [facility] to communicate with providers for non-urgent requests or questions.  Due to long hold times on the telephone, sending your provider a message by [employer] may be a faster and more efficient way to get a response.  Please allow 48 business hours for a response.  Please remember that this is for non-urgent requests.    Due to recent changes in healthcare laws, you may see the results of your imaging and laboratory studies on MyChart before your provider has had a chance to review them.  We understand that in some cases there may be results that are confusing or concerning to you. Not all laboratory results come back in the same time frame and the provider may be waiting for multiple results in order to interpret others.  Please give [place] 48 hours in order for your provider to thoroughly review all the results before contacting the office for clarification of your results.   We have sent the following medications to your pharmacy for you to pick up at your convenience: Clenpiq  You have been scheduled for an endoscopy and colonoscopy. Please follow the written instructions given to you at your visit today. Please pick up your prep supplies at the pharmacy within the next 1-3 days. If you use inhalers (even only as needed), please bring them with you on the day of your procedure.   Thank you for choosing me and [facility].  Vito Cirigliano, D.O.

## 2022-01-10 ENCOUNTER — Other Ambulatory Visit: Payer: Self-pay | Admitting: Family Medicine

## 2022-01-10 NOTE — Telephone Encounter (Signed)
Last office visit 01/03/22 for MDD.  Last refilled 01/03/22 for #90 with 3 refills.  That is only a 10 day supply per instructions of 3 tablets bid. Okay to send in #180?

## 2022-01-11 ENCOUNTER — Encounter: Payer: Self-pay | Admitting: *Deleted

## 2022-01-11 NOTE — Telephone Encounter (Signed)
Per patient's MyChart response,  she is only taking 3 capsules once daily.  Medication list updated with correct dosing instructions.  Refill denied.

## 2022-01-11 NOTE — Telephone Encounter (Signed)
MyChart message sent to Tiffany Donovan asking her to clarify how she is taking her Topiramate.  Awaiting response.

## 2022-01-12 ENCOUNTER — Encounter: Payer: Self-pay | Admitting: Gastroenterology

## 2022-01-18 ENCOUNTER — Ambulatory Visit (AMBULATORY_SURGERY_CENTER): Payer: BLUE CROSS/BLUE SHIELD | Admitting: Gastroenterology

## 2022-01-18 ENCOUNTER — Encounter: Payer: Self-pay | Admitting: Gastroenterology

## 2022-01-18 VITALS — BP 101/53 | HR 52 | Temp 98.6°F | Resp 19 | Ht 63.0 in | Wt 212.0 lb

## 2022-01-18 DIAGNOSIS — K2970 Gastritis, unspecified, without bleeding: Secondary | ICD-10-CM

## 2022-01-18 DIAGNOSIS — D125 Benign neoplasm of sigmoid colon: Secondary | ICD-10-CM

## 2022-01-18 DIAGNOSIS — R933 Abnormal findings on diagnostic imaging of other parts of digestive tract: Secondary | ICD-10-CM | POA: Diagnosis not present

## 2022-01-18 DIAGNOSIS — K5732 Diverticulitis of large intestine without perforation or abscess without bleeding: Secondary | ICD-10-CM | POA: Diagnosis present

## 2022-01-18 DIAGNOSIS — K635 Polyp of colon: Secondary | ICD-10-CM

## 2022-01-18 DIAGNOSIS — K449 Diaphragmatic hernia without obstruction or gangrene: Secondary | ICD-10-CM | POA: Diagnosis not present

## 2022-01-18 DIAGNOSIS — K640 First degree hemorrhoids: Secondary | ICD-10-CM

## 2022-01-18 DIAGNOSIS — D124 Benign neoplasm of descending colon: Secondary | ICD-10-CM

## 2022-01-18 DIAGNOSIS — K5730 Diverticulosis of large intestine without perforation or abscess without bleeding: Secondary | ICD-10-CM

## 2022-01-18 DIAGNOSIS — R1012 Left upper quadrant pain: Secondary | ICD-10-CM

## 2022-01-18 DIAGNOSIS — K2100 Gastro-esophageal reflux disease with esophagitis, without bleeding: Secondary | ICD-10-CM

## 2022-01-18 DIAGNOSIS — K319 Disease of stomach and duodenum, unspecified: Secondary | ICD-10-CM | POA: Diagnosis not present

## 2022-01-18 DIAGNOSIS — R1032 Left lower quadrant pain: Secondary | ICD-10-CM

## 2022-01-18 MED ORDER — SODIUM CHLORIDE 0.9 % IV SOLN: 500.0000 mL | Freq: Once | INTRAVENOUS | Status: DC

## 2022-01-18 MED ORDER — OMEPRAZOLE 20 MG PO CPDR: 20.0000 mg | DELAYED_RELEASE_CAPSULE | Freq: Two times a day (BID) | ORAL | 3 refills | Status: DC

## 2022-01-18 NOTE — Op Note (Signed)
[facility] Patient Name: Tiffany Donovan Procedure Date: 01/18/2022 1:58 PM MRN: [id number] Endoscopist: Gerrit Heck , MD Age: 36 Referring MD:  Date of Birth: 10/18/1985 Gender: Female Account #: [id number] Procedure:                Colonoscopy Indications:              Abdominal pain in the left lower quadrant,                            Follow-up of diverticulitis (diagnosed 08/2021 in ER                            by CT, treated with antibiotics) Medicines:                Monitored Anesthesia Care Procedure:                Pre-Anesthesia Assessment:                           - Prior to the procedure, a History and Physical                            was performed, and patient medications and                            allergies were reviewed. The patient's tolerance of                            previous anesthesia was also reviewed. The risks                            and benefits of the procedure and the sedation                            options and risks were discussed with the patient.                            All questions were answered, and informed consent                            was obtained. Prior Anticoagulants: The patient has                            taken no previous anticoagulant or antiplatelet                            agents. ASA Grade Assessment: II - A patient with                            mild systemic disease. After reviewing the risks                            and benefits, the patient was deemed in  satisfactory condition to undergo the procedure.                           After obtaining informed consent, the colonoscope                            was passed under direct vision. Throughout the                            procedure, the patient's blood pressure, pulse, and                            oxygen saturations were monitored continuously. The                            CF [id number] #[id number] was  introduced through the anus                            and advanced to the the terminal ileum. The                            colonoscopy was performed without difficulty. The                            patient tolerated the procedure well. The quality                            of the bowel preparation was good. The terminal                            ileum, ileocecal valve, appendiceal orifice, and                            rectum were photographed. Scope In: 2:27:29 PM Scope Out: 2:44:11 PM Scope Withdrawal Time: 0 hours 12 minutes 55 seconds  Total Procedure Duration: 0 hours 16 minutes 42 seconds  Findings:                 The perianal and digital rectal examinations were                            normal.                           Three sessile polyps were found in the sigmoid                            colon (2) and descending colon (1). The polyps were                            3 to 5 mm in size. These polyps were removed with a                            cold snare. Resection and retrieval were complete.  Estimated blood loss was minimal.                           Multiple small and large-mouthed diverticula were                            found in the sigmoid colon and ascending colon.                           Non-bleeding internal hemorrhoids were found during                            retroflexion. The hemorrhoids were small.                           The mucosa was otherwise normal appearing                            throughout the remainder of the colon. No areas of                            mucosal erythema, edema, erosions, or ulceration                            noted. The area of previously-diagnosed                            diverticulitis has since healed without residual                            inflamamtion.                           The terminal ileum appeared normal. Complications:            No immediate  complications. Estimated Blood Loss:     Estimated blood loss was minimal. Impression:               - Three 3 to 5 mm polyps in the sigmoid colon and                            in the descending colon, removed with a cold snare.                            Resected and retrieved.                           - Diverticulosis in the sigmoid colon and in the                            ascending colon.                           - Non-bleeding internal hemorrhoids.                           -  Normal mucosa in the entire examined colon.                           - The examined portion of the ileum was normal. Recommendation:           - Patient has a contact number available for                            emergencies. The signs and symptoms of potential                            delayed complications were discussed with the                            patient. Return to normal activities tomorrow.                            Written discharge instructions were provided to the                            patient.                           - Resume previous diet.                           - Continue present medications.                           - Await pathology results.                           - Repeat colonoscopy for surveillance based on                            pathology results.                           - Return to GI office PRN. Gerrit Heck, MD 01/18/2022 2:58:03 PM

## 2022-01-18 NOTE — Progress Notes (Signed)
Called to room to assist during endoscopic procedure.  Patient ID and intended procedure confirmed with present staff. Received instructions for my participation in the procedure from the performing physician.  

## 2022-01-18 NOTE — Op Note (Signed)
[facility] Patient Name: Tiffany Donovan Procedure Date: 01/18/2022 1:59 PM MRN: [id number] Endoscopist: Gerrit Heck , MD Age: 36 Referring MD:  Date of Birth: April 23, 1986 Gender: Female Account #: [id number] Procedure:                Upper GI endoscopy Indications:              Abdominal pain in the left upper quadrant,                            Heartburn, Suspected esophageal reflux Medicines:                Monitored Anesthesia Care Procedure:                Pre-Anesthesia Assessment:                           - Prior to the procedure, a History and Physical                            was performed, and patient medications and                            allergies were reviewed. The patient's tolerance of                            previous anesthesia was also reviewed. The risks                            and benefits of the procedure and the sedation                            options and risks were discussed with the patient.                            All questions were answered, and informed consent                            was obtained. Prior Anticoagulants: The patient has                            taken no previous anticoagulant or antiplatelet                            agents. ASA Grade Assessment: II - A patient with                            mild systemic disease. After reviewing the risks                            and benefits, the patient was deemed in                            satisfactory condition to undergo the procedure.  After obtaining informed consent, the endoscope was                            passed under direct vision. Throughout the                            procedure, the patient's blood pressure, pulse, and                            oxygen saturations were monitored continuously. The                            Endoscope was introduced through the mouth, and                            advanced to the second  part of duodenum. The upper                            GI endoscopy was accomplished without difficulty.                            The patient tolerated the procedure well. Scope In: Scope Out: Findings:                 LA Grade A (one or more mucosal breaks less than 5                            mm, not extending between tops of 2 mucosal folds)                            esophagitis with no bleeding was found 34 cm from                            the incisors.                           A 3 cm hiatal hernia was present.                           The gastroesophageal flap valve was visualized                            endoscopically and classified as Hill Grade IV (no                            fold, wide open lumen, hiatal hernia present).                           Localized mild inflammation characterized by                            erythema was found in the gastric antrum. Biopsies  were taken with a cold forceps for histology.                            Estimated blood loss was minimal.                           The gastric fundus and gastric body were normal.                            Biopsies were taken with a cold forceps for                            Helicobacter pylori testing. Estimated blood loss                            was minimal.                           The examined duodenum was normal. Biopsies were                            taken with a cold forceps for histology. Estimated                            blood loss was minimal. Complications:            No immediate complications. Estimated Blood Loss:     Estimated blood loss was minimal. Impression:               - LA Grade A reflux esophagitis with no bleeding.                           - 3 cm hiatal hernia.                           - Gastroesophageal flap valve classified as Hill                            Grade IV (no fold, wide open lumen, hiatal hernia                             present).                           - Gastritis. Biopsied.                           - Normal gastric fundus and gastric body. Biopsied.                           - Normal examined duodenum. Biopsied. Recommendation:           - Patient has a contact number available for                            emergencies. The signs and symptoms of potential  delayed complications were discussed with the                            patient. Return to normal activities tomorrow.                            Written discharge instructions were provided to the                            patient.                           - Resume previous diet.                           - Start Prilosec (omeprazole) 20 mg PO BID for 6                            weeks to promote mucosal healing of erosive                            esophagitis and gastritis, then reduce to lowest                            effective dose to control reflux symptoms.                           - Await pathology results.                           - Colonoscopy today. Gerrit Heck, MD 01/18/2022 2:53:15 PM

## 2022-01-18 NOTE — Progress Notes (Signed)
Pt with piercing in lip which cannot be remove; the need for the bite block explained with possible damage to lip d/t piercing, pt understands and accepts risk.  Dr. Bryan Lemma aware

## 2022-01-18 NOTE — Progress Notes (Signed)
GASTROENTEROLOGY PROCEDURE H&P NOTE   Primary Care Physician: Jinny Sanders, MD    Reason for Procedure:  Diverticulitis, LLQ pain, nausea, GERD  Plan:    EGD, colonoscopy  Patient is appropriate for endoscopic procedure(s) in the ambulatory ([facility]) setting.  The nature of the procedure, as well as the risks, benefits, and alternatives were carefully and thoroughly reviewed with the patient. Ample time for discussion and questions allowed. The patient understood, was satisfied, and agreed to proceed.     HPI: Tiffany Donovan is a 36 y.o. female who presents for EGD for evaluation of nausea and GERD along with colonoscopy for evaluation of prior diverticulitis and ongoing intermittent LLQ pain.  Past Medical History:  Diagnosis Date   Anxiety    Depression    Diverticulitis    Diverticulosis    Low back pain    Migraine headache    Obesity    Premenstrual dysphoric disorder     Past Surgical History:  Procedure Laterality Date   None      Prior to Admission medications   Medication Sig Start Date End Date Taking? Authorizing Provider  buPROPion (WELLBUTRIN SR) 150 MG 12 hr tablet TAKE 1 TABLET (150 MG TOTAL) BY MOUTH 2 (TWO) TIMES DAILY IN THE MORNING AND 4PM 01/03/22   Bedsole, Amy E, MD  omeprazole (PRILOSEC OTC) 20 MG tablet Take 20 mg by mouth daily as needed (for acid reflux).    [provider]  ondansetron (ZOFRAN-ODT) 4 MG disintegrating tablet Take 1 tablet (4 mg total) by mouth every 8 (eight) hours as needed for nausea or vomiting. 09/14/21   Montine Circle, PA-C  SUMAtriptan (IMITREX) 100 MG tablet TAKE 1 TABLET BY MOUTH EVERY 2 HOURS AS NEEDED FOR MIGRAINE. MAY REPEAT IN 2 HOURS IF HEADACHE PERSISTS OR RECURS. Patient taking differently: Take 100 mg by mouth every 2 (two) hours as needed for migraine. 06/13/21   Bedsole, Amy E, MD  topiramate (TOPAMAX) 50 MG tablet Take 150 mg by mouth daily.    [provider]  venlafaxine XR (EFFEXOR  XR) 37.5 MG 24 hr capsule Start at 1 capsule daily, if tolerated increase to 2  capsules daily 01/02/22   Jinny Sanders, MD    Current Outpatient Medications  Medication Sig Dispense Refill   buPROPion (WELLBUTRIN SR) 150 MG 12 hr tablet TAKE 1 TABLET (150 MG TOTAL) BY MOUTH 2 (TWO) TIMES DAILY IN THE MORNING AND 4PM 180 tablet 1   omeprazole (PRILOSEC OTC) 20 MG tablet Take 20 mg by mouth daily as needed (for acid reflux).     ondansetron (ZOFRAN-ODT) 4 MG disintegrating tablet Take 1 tablet (4 mg total) by mouth every 8 (eight) hours as needed for nausea or vomiting. 10 tablet 0   SUMAtriptan (IMITREX) 100 MG tablet TAKE 1 TABLET BY MOUTH EVERY 2 HOURS AS NEEDED FOR MIGRAINE. MAY REPEAT IN 2 HOURS IF HEADACHE PERSISTS OR RECURS. (Patient taking differently: Take 100 mg by mouth every 2 (two) hours as needed for migraine.) 10 tablet 2   topiramate (TOPAMAX) 50 MG tablet Take 150 mg by mouth daily.     venlafaxine XR (EFFEXOR XR) 37.5 MG 24 hr capsule Start at 1 capsule daily, if tolerated increase to 2  capsules daily 60 capsule 3   No current facility-administered medications for this visit.    Allergies as of 01/18/2022   (No Known Allergies)    Family History  Problem Relation Age of Onset   Depression  Mother    Diabetes Father    Kidney disease Father    Hypertension Father    Clotting disorder Father    Migraines Sister    Irritable bowel syndrome Sister    Migraines Sister    Diverticulosis Sister    Irritable bowel syndrome Sister    Depression Brother    Coronary artery disease Paternal Grandmother    Breast cancer Paternal Grandmother    Clotting disorder Paternal Grandmother    Coronary artery disease Paternal Grandfather    Colon cancer Paternal Grandfather    Cystic fibrosis Cousin        on mom side   Esophageal cancer Neg Hx     Social History   Socioeconomic History   Marital status: Legally Separated    Spouse name: Not on file   Number of children: 2    Years of education: Not on file   Highest education level: Not on file  Occupational History   Occupation: customer service    Employer: [employer]  Tobacco Use   Smoking status: Never   Smokeless tobacco: Never  Vaping Use   Vaping Use: Some days  Substance and Sexual Activity   Alcohol use: Yes    Alcohol/week: 0.0 standard drinks of alcohol    Comment: occasionally   Drug use: No   Sexual activity: Yes    Partners: Female    Birth control/protection: None  Other Topics Concern   Not on file  Social History Narrative   Regular exercise- no      Diet: fruits and veggies..but, just eats 1 meal a day   Social Determinants of Health   Financial Resource Strain: Not on file  Food Insecurity: Not on file  Transportation Needs: Not on file  Physical Activity: Not on file  Stress: Not on file  Social Connections: Not on file  Intimate Partner Violence: Not on file    Physical Exam: Vital signs in last 24 hours: '@There'$  were no vitals taken for this visit. GEN: NAD EYE: Sclerae anicteric ENT: MMM CV: Non-tachycardic Pulm: CTA b/l GI: Soft, NT/ND NEURO:  Alert & Oriented x 3   Gerrit Heck, DO [facility]   01/18/2022 1:43 PM

## 2022-01-18 NOTE — Patient Instructions (Signed)
Thank you for coming in to see [place] today! Await final biopsies, approximately 2 weeks.  Will notify you in writing unless there is something that needs treated, and then we would call. Resume previous diet and medications today, return to normal daily activities tomorrow. Prescription for Omeprazole has been sent to your pharmacy.  You will take this twice per day before your first and last meals of the day. After 6 weeks you may reduce this to once daily for one month, then you may try to wean off if you remain symptom free. Return to normal daily activities tomorrow.   YOU HAD AN ENDOSCOPIC PROCEDURE TODAY AT [facility]:   Refer to the procedure report that was given to you for any specific questions about what was found during the examination.  If the procedure report does not answer your questions, please call your gastroenterologist to clarify.  If you requested that your care partner not be given the details of your procedure findings, then the procedure report has been included in a sealed envelope for you to review at your convenience later.  YOU SHOULD EXPECT: Some feelings of bloating in the abdomen. Passage of more gas than usual.  Walking can help get rid of the air that was put into your GI tract during the procedure and reduce the bloating. If you had a lower endoscopy (such as a colonoscopy or flexible sigmoidoscopy) you may notice spotting of blood in your stool or on the toilet paper. If you underwent a bowel prep for your procedure, you may not have a normal bowel movement for a few days.  Please Note:  You might notice some irritation and congestion in your nose or some drainage.  This is from the oxygen used during your procedure.  There is no need for concern and it should clear up in a day or so.  SYMPTOMS TO REPORT IMMEDIATELY:  Following lower endoscopy (colonoscopy or flexible sigmoidoscopy):  Excessive amounts of blood in the stool  Significant tenderness  or worsening of abdominal pains  Swelling of the abdomen that is new, acute  Fever of 100F or higher  Following upper endoscopy (EGD)  Vomiting of blood or coffee ground material  New chest pain or pain under the shoulder blades  Painful or persistently difficult swallowing  New shortness of breath  Fever of 100F or higher  Black, tarry-looking stools  For urgent or emergent issues, a gastroenterologist can be reached at any hour by calling [phone]. Do not use MyChart messaging for urgent concerns.    DIET:  We do recommend a small meal at first, but then you may proceed to your regular diet.  Drink plenty of fluids but you should avoid alcoholic beverages for 24 hours.  ACTIVITY:  You should plan to take it easy for the rest of today and you should NOT DRIVE or use heavy machinery until tomorrow (because of the sedation medicines used during the test).    FOLLOW UP: Our staff will call the number listed on your records the next business day following your procedure.  We will call around 7:15- 8:00 am to check on you and address any questions or concerns that you may have regarding the information given to you following your procedure. If we do not reach you, we will leave a message.  If you develop any symptoms (ie: fever, flu-like symptoms, shortness of breath, cough etc.) before then, please call [phone].  If you test positive for Covid 19  in the 2 weeks post procedure, please call and report this information to [place].    If any biopsies were taken you will be contacted by phone or by letter within the next 1-3 weeks.  Please call us at [phone] if you have not heard about the biopsies in 3 weeks.    SIGNATURES/CONFIDENTIALITY: You and/or your care partner have signed paperwork which will be entered into your electronic medical record.  These signatures attest to the fact that that the information above on your After Visit Summary has been reviewed and is understood.   Full responsibility of the confidentiality of this discharge information lies with you and/or your care-partner.

## 2022-01-18 NOTE — Progress Notes (Signed)
Sedate, gd SR, tolerated procedure well, VSS, report to RN 

## 2022-01-19 ENCOUNTER — Telehealth: Payer: Self-pay

## 2022-01-19 NOTE — Telephone Encounter (Signed)
  Follow up Call-     01/18/2022    2:00 PM  Call back number  Post procedure Call Back phone  # [phone]  Permission to leave phone message Yes     Patient questions:  Do you have a fever, pain , or abdominal swelling? No. Pain Score  0 *  Have you tolerated food without any problems? Yes.    Have you been able to return to your normal activities? Yes.    Do you have any questions about your discharge instructions: Diet   No. Medications  No. Follow up visit  No.  Do you have questions or concerns about your Care? No.  Actions: * If pain score is 4 or above: No action needed, pain <4.

## 2022-01-22 ENCOUNTER — Other Ambulatory Visit: Payer: Self-pay | Admitting: Family Medicine

## 2022-01-25 ENCOUNTER — Other Ambulatory Visit: Payer: Self-pay | Admitting: Family Medicine

## 2022-02-02 ENCOUNTER — Other Ambulatory Visit: Payer: Self-pay

## 2022-02-02 ENCOUNTER — Encounter: Payer: Self-pay | Admitting: Gastroenterology

## 2022-02-02 MED ORDER — PANTOPRAZOLE SODIUM 40 MG PO TBEC: 40.0000 mg | DELAYED_RELEASE_TABLET | Freq: Two times a day (BID) | ORAL | 2 refills | Status: DC

## 2022-02-02 NOTE — Telephone Encounter (Signed)
Sounds like she is having continued breakthrough reflux symptoms, particularly with some of the described foods.  Plan for the following:  - Stop Prilosec - Start Protonix 40 mg p.o. twice daily.  Ensure she is taking 30-60 minutes before breakfast and dinner - Low FODMAP diet - Will allow a couple of weeks to assess for clinical response.  If suboptimal response, will then add IBgard

## 2022-02-02 NOTE — Telephone Encounter (Signed)
Biopsies from the recent endoscopic procedures are notable for the following:  Upper Endoscopy: - The biopsies taken from the duodenum (small intestine) were normal.  There is no evidence of Celiac Disease or other worrisome features - The biopsies taken from the stomach were normal.  No evidence of Helicobacter pylori infection, metaplasia, dysplasia, or other worrisome features.  Colonoscopy: - The polyps that were removed from the colon were benign Hyperplastic Polyps.  These types of polyps harbor no malignant potential.  I recommend repeat colonoscopy in 10 years for routine Colon Cancer Screening.  For the active GI symptoms, can you please check to see if she is referring to upper abdominal symptoms that could be related to her reflux with esophagitis noted at time of upper endoscopy.  Has she noted any improvement since starting Prilosec 20 mg twice daily?  If still having heartburn or regurgitation, can increase to Protonix 40 mg twice daily (and stop Prilosec).  If symptoms are more so abdominal pain, try starting IBgard and trial of low FODMAP diet for the next 4-6 weeks

## 2022-02-02 NOTE — Telephone Encounter (Signed)
Spoke with pt and gave her results and recommendations. Let pt know I would also send results to her MyChart for her to review. Pt stated that the Prilosec has helped with the burning sensation she felt but pt also reports she hasn't been eating much and has been limiting certain foods that she knows that set off her symptoms like red meat, spicy foods, broccoli, and bell peppers. Pt states she still has pain in her upper abd towards the left side of her rib cage, pain in her navel, and pain in her Left lower abd. All areas of pain occur after eating and when she doesn't eat she doesn't experience pain. Pain ranges from a 5-8/10 and she has limited her meals to one meal per day to avoid the pain. Pt also reports lower abd pain radiates to her back. Which recommendations should she follow?

## 2022-02-06 MED ORDER — TOPIRAMATE 50 MG PO TABS: 150.0000 mg | ORAL_TABLET | Freq: Every day | ORAL | 2 refills | Status: DC

## 2022-02-06 NOTE — Addendum Note (Signed)
Addended by: Carter Kitten on: 02/06/2022 10:06 AM   Modules accepted: Orders

## 2022-03-07 IMAGING — DX DG ABDOMEN 1V
2 series · 2 of 2 positions shown · non-contrast
Comparison: None

CLINICAL DATA: 34-year-old female with constipation

EXAM:
ABDOMEN - 1 VIEW

[abdomen kub (1 of 2)]
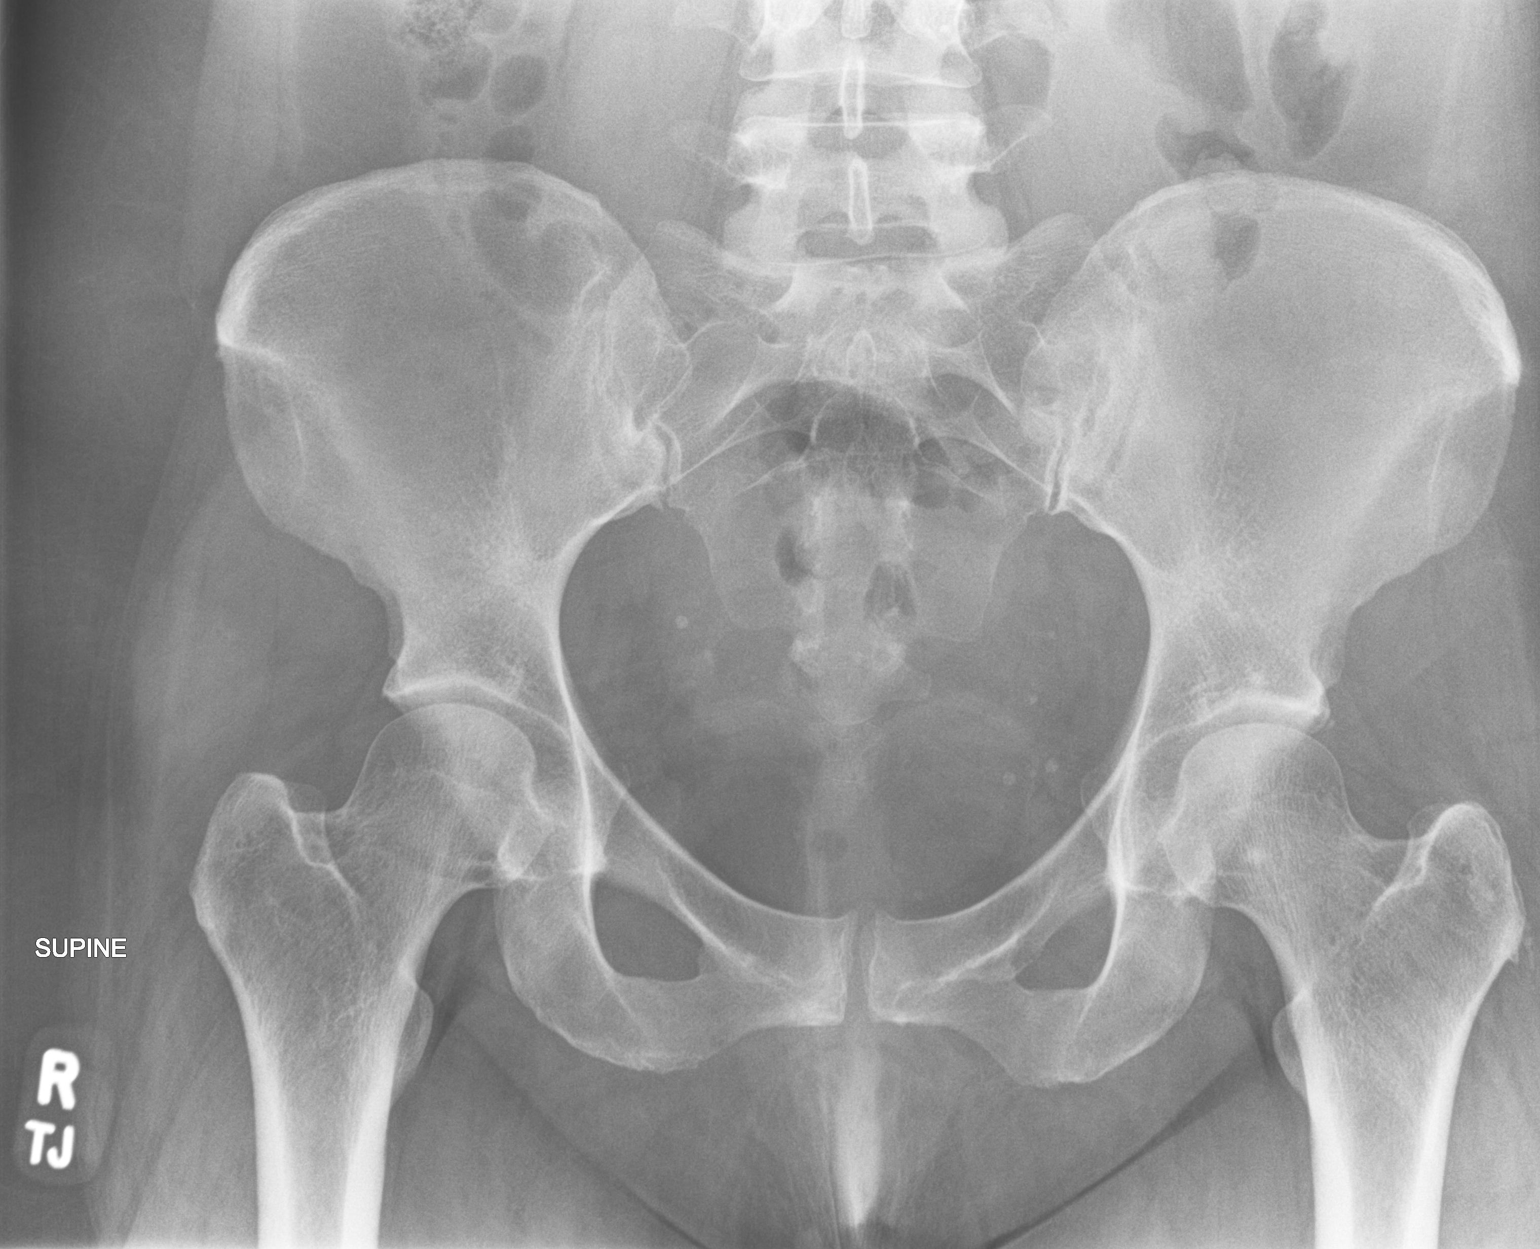

[abdomen kub (2 of 2)]
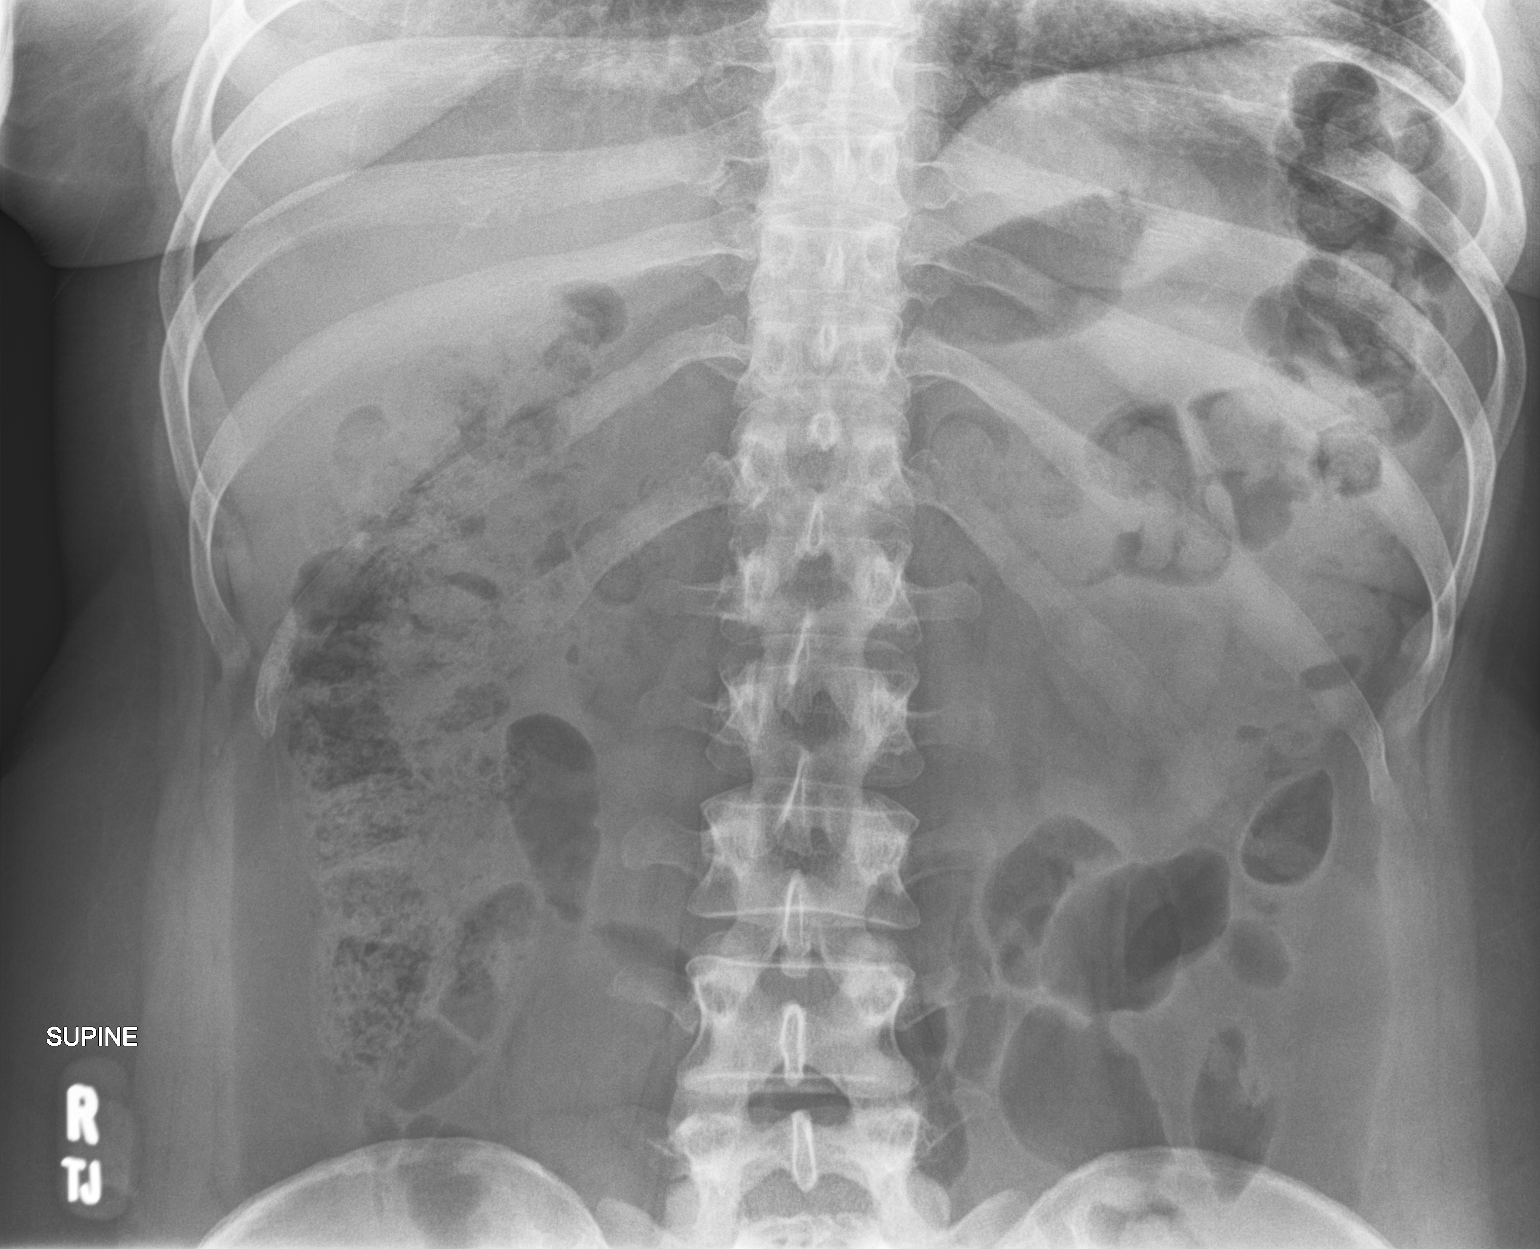

[2 of 2 positions shown; findings below may reference images not displayed]

FINDINGS: Gas within stomach small bowel and colon. No abnormal distension. No
air-fluid levels. No unexpected radiopaque foreign body. No
unexpected calcification or soft tissue density.

No displaced fracture.
IMPRESSION: Negative plain film abdomen

## 2022-04-25 ENCOUNTER — Other Ambulatory Visit: Payer: Self-pay | Admitting: Family Medicine

## 2022-04-25 NOTE — Telephone Encounter (Signed)
Please call and schedule CPE with fasting labs for Dr. Diona Browner.  Please send back to me once scheduled to refill medication.

## 2022-04-26 ENCOUNTER — Other Ambulatory Visit: Payer: Self-pay | Admitting: Gastroenterology

## 2022-04-26 ENCOUNTER — Other Ambulatory Visit: Payer: Self-pay | Admitting: Family Medicine

## 2022-04-26 NOTE — Telephone Encounter (Signed)
Last office visit 03/22/2021 for Abdominal pain.  Last refilled 01/25/22 for #180 with no refills.  No future appointments.  Office has called and many MyChart messages sent to patient about scheduling appointments.  Refill?

## 2022-04-26 NOTE — Telephone Encounter (Signed)
Call pt, no answer, left vm to call back

## 2022-06-03 ENCOUNTER — Encounter: Payer: Self-pay | Admitting: Family Medicine

## 2022-06-04 MED ORDER — TOPIRAMATE 50 MG PO TABS: 150.0000 mg | ORAL_TABLET | Freq: Every day | ORAL | 0 refills | Status: DC

## 2022-06-20 ENCOUNTER — Encounter: Payer: Self-pay | Admitting: Family Medicine

## 2022-06-20 NOTE — Telephone Encounter (Signed)
I spoke with pt; starting about 1 wk ago pt began with sharp lt side to mid lower abd pain that comes and goes daily. Pt said now pain level is 5-6 (pt is at work now) and at the worst pain level is 8. Pt has nausea on and off. No vomiting, diarrhea or constipation. Pt has not seen any blood. No fever. Pt has hx of diverticulitis and pt said symptoms now are similar to when had diverticulitis. Pt tries to watch what she eats but recently has been eating fruits and vegetables.  At this time pt does not feel the need to go to [facility] or ED. No available appts at [facility] today. Pt scheduled appt with Dr Diona Browner on 06/21/22 at 12 noon with UC & ED precautions and also dietary suggestions and pt voiced understanding.Sending note to Dr Diona Browner and [place] [facility].

## 2022-06-20 NOTE — Telephone Encounter (Signed)
Noted  

## 2022-06-21 ENCOUNTER — Encounter: Payer: Self-pay | Admitting: Family Medicine

## 2022-06-21 ENCOUNTER — Ambulatory Visit: Payer: BC Managed Care – PPO | Admitting: Family Medicine

## 2022-06-21 VITALS — BP 110/70 | HR 72 | Temp 97.7°F | Ht 63.0 in | Wt 201.0 lb

## 2022-06-21 DIAGNOSIS — R1032 Left lower quadrant pain: Secondary | ICD-10-CM | POA: Diagnosis not present

## 2022-06-21 MED ORDER — ONDANSETRON 4 MG PO TBDP: 4.0000 mg | ORAL_TABLET | Freq: Three times a day (TID) | ORAL | 1 refills | Status: DC | PRN

## 2022-06-21 MED ORDER — AMOXICILLIN-POT CLAVULANATE 875-125 MG PO TABS: 1.0000 | ORAL_TABLET | Freq: Two times a day (BID) | ORAL | 0 refills | Status: DC

## 2022-06-21 NOTE — Progress Notes (Unsigned)
Patient ID: Tiffany Donovan, female    DOB: [birth year], 36 y.o.   MRN: [id number]  This visit was conducted in person.  BP 110/70 (BP Location: Left Arm, Patient Position: Sitting, Cuff Size: Normal)   Pulse 72   Temp 97.7 F (36.5 C) (Temporal)   Ht '5\' 3"'$  (1.6 m)   Wt 201 lb (91.2 kg)   SpO2 99%   BMI 35.61 kg/m    CC:  Chief Complaint  Patient presents with   Abdominal Pain    Left side x 1-2 weeks. Patient states she also has the pain around her belly button. Pain is constant and worse when she eats.     Subjective:   HPI: Tiffany Donovan is a 36 y.o. female with history of  diverticulitis ER visit 08/2021, chronic pelvic pain, low back pain, GAD, stress incontinence.presenting on 06/21/2022 for Abdominal Pain (Left side x 1-2 weeks. Patient states she also has the pain around her belly button. Pain is constant and worse when she eats. )  Has seen Dr. Bryan Lemma in past GI Last note 01/2022.Marland Kitchen Sounds like she is having continued breakthrough reflux symptoms, particularly with some of the described foods.  Plan for the following: - Stop Prilosec - Start Protonix 40 mg p.o. twice daily.  Ensure she is taking 30-60 minutes before breakfast and dinner - Low FODMAP diet - Will allow a couple of weeks to assess for clinical response.  If suboptimal response, will then add IBgard     Sees GYN:  Dr. Kennon Rounds   Prescribed Drospirenone and pelvic PT for possible fibroid induced pelvic pain. - 09/22/2021: TVUS: Enlarged uterus with multiple fibroids, 1.9 cm complex cyst in right ovary may suggest hemorrhagic cyst/follicle.    She was doing better with FODMAP diet for IBS.   Today she reports 1.5 weeks of LLQ pain, worse with eating. PAin with going over bumps in car.  This feels similar to diverticulitis in past.  No fever, no D/C.  No blood in stool.  No dysuria. Has been feeling more nauseous lately.   Relevant past medical, surgical, family and social history reviewed and updated  as indicated. Interim medical history since our last visit reviewed. Allergies and medications reviewed and updated. Outpatient Medications Prior to Visit  Medication Sig Dispense Refill   buPROPion (WELLBUTRIN SR) 150 MG 12 hr tablet TAKE 1 TABLET (150 MG TOTAL) BY MOUTH 2 (TWO) TIMES DAILY IN THE MORNING AND 4PM 180 tablet 1   ondansetron (ZOFRAN-ODT) 4 MG disintegrating tablet Take 1 tablet (4 mg total) by mouth every 8 (eight) hours as needed for nausea or vomiting. 10 tablet 0   pantoprazole (PROTONIX) 40 MG tablet TAKE 1 TABLET BY MOUTH 2 TIMES DAILY. TAKE 30-60 MIN BEFORE BREAKFAST AND DINNER. 180 tablet 1   SUMAtriptan (IMITREX) 100 MG tablet TAKE 1 TABLET BY MOUTH EVERY 2 HOURS AS NEEDED FOR MIGRAINE. MAY REPEAT IN 2 HOURS IF HEADACHE PERSISTS OR RECURS. (Patient taking differently: Take 100 mg by mouth every 2 (two) hours as needed for migraine.) 10 tablet 2   topiramate (TOPAMAX) 50 MG tablet Take 3 tablets (150 mg total) by mouth daily. 90 tablet 0   venlafaxine XR (EFFEXOR-XR) 37.5 MG 24 hr capsule TAKE 1 CAPSULE BY MOUTH DAILY IF TOLERATED MAY INCREASE TO 2 CAPSULES DAILY 180 capsule 0   No facility-administered medications prior to visit.     Per HPI unless specifically indicated in ROS section below Review of Systems Objective:  BP 110/70 (BP Location: Left Arm, Patient Position: Sitting, Cuff Size: Normal)   Pulse 72   Temp 97.7 F (36.5 C) (Temporal)   Ht '5\' 3"'$  (1.6 m)   Wt 201 lb (91.2 kg)   SpO2 99%   BMI 35.61 kg/m   Wt Readings from Last 3 Encounters:  06/21/22 201 lb (91.2 kg)  01/18/22 212 lb (96.2 kg)  01/09/22 212 lb 8 oz (96.4 kg)      Physical Exam    Results for orders placed or performed during the hospital encounter of 09/13/21  Resp Panel by RT-PCR (Flu A&B, Covid) Nasopharyngeal Swab   Specimen: Nasopharyngeal Swab; Nasopharyngeal(NP) swabs in vial transport medium  Result Value Ref Range   SARS Coronavirus 2 by RT PCR NEGATIVE NEGATIVE    Influenza A by PCR NEGATIVE NEGATIVE   Influenza B by PCR NEGATIVE NEGATIVE  CBC with Differential  Result Value Ref Range   WBC 11.4 (H) 4.0 - 10.5 K/uL   RBC 4.31 3.87 - 5.11 MIL/uL   Hemoglobin 13.2 12.0 - 15.0 g/dL   HCT 38.7 36.0 - 46.0 %   MCV 89.8 80.0 - 100.0 fL   MCH 30.6 26.0 - 34.0 pg   MCHC 34.1 30.0 - 36.0 g/dL   RDW 12.7 11.5 - 15.5 %   Platelets 277 150 - 400 K/uL   nRBC 0.0 0.0 - 0.2 %   Neutrophils Relative % 82 %   Neutro Abs 9.3 (H) 1.7 - 7.7 K/uL   Lymphocytes Relative 11 %   Lymphs Abs 1.3 0.7 - 4.0 K/uL   Monocytes Relative 6 %   Monocytes Absolute 0.7 0.1 - 1.0 K/uL   Eosinophils Relative 1 %   Eosinophils Absolute 0.1 0.0 - 0.5 K/uL   Basophils Relative 0 %   Basophils Absolute 0.0 0.0 - 0.1 K/uL   Immature Granulocytes 0 %   Abs Immature Granulocytes 0.04 0.00 - 0.07 K/uL  Comprehensive metabolic panel  Result Value Ref Range   Sodium 135 135 - 145 mmol/L   Potassium 3.7 3.5 - 5.1 mmol/L   Chloride 107 98 - 111 mmol/L   CO2 22 22 - 32 mmol/L   Glucose, Bld 98 70 - 99 mg/dL   BUN 12 6 - 20 mg/dL   Creatinine, Ser 0.77 0.44 - 1.00 mg/dL   Calcium 8.6 (L) 8.9 - 10.3 mg/dL   Total Protein 7.3 6.5 - 8.1 g/dL   Albumin 4.0 3.5 - 5.0 g/dL   AST 22 15 - 41 U/L   ALT 27 0 - 44 U/L   Alkaline Phosphatase 51 38 - 126 U/L   Total Bilirubin 0.7 0.3 - 1.2 mg/dL   GFR, Estimated >60 >60 mL/min   Anion gap 6 5 - 15  Lipase, blood  Result Value Ref Range   Lipase 26 11 - 51 U/L  Lactic acid, plasma  Result Value Ref Range   Lactic Acid, Venous 0.5 0.5 - 1.9 mmol/L  I-Stat Beta hCG blood, ED ([facility], [facility], [facility] only)  Result Value Ref Range   I-stat hCG, quantitative <5.0 <5 mIU/mL   Comment 3             COVID 19 screen:  No recent travel or known exposure to COVID19 The patient denies respiratory symptoms of COVID 19 at this time. The importance of social distancing was discussed today.   Assessment and Plan     Eliezer Lofts, MD

## 2022-06-22 ENCOUNTER — Encounter: Payer: Self-pay | Admitting: Family Medicine

## 2022-06-22 NOTE — Assessment & Plan Note (Addendum)
Acute Patient this feels similar to pain she has had in the past with acute diverticulitis.   No clear inndication for repeat CT abd/pelvis. Will evaluate with labs.  Treat with Augmentin  BID x 10 days.

## 2022-06-30 ENCOUNTER — Encounter: Payer: BC Managed Care – PPO | Admitting: Family Medicine

## 2022-07-13 ENCOUNTER — Other Ambulatory Visit: Payer: Self-pay | Admitting: Family Medicine

## 2022-07-13 NOTE — Telephone Encounter (Signed)
Patient no showed her Physical on 06/30/22.  Please call and try to reschedule.

## 2022-07-14 NOTE — Telephone Encounter (Signed)
Spoke to pt, scheduled cpe for 08/03/22

## 2022-07-27 ENCOUNTER — Ambulatory Visit: Payer: BC Managed Care – PPO | Admitting: Family Medicine

## 2022-08-03 ENCOUNTER — Encounter: Payer: Self-pay | Admitting: Family Medicine

## 2022-08-03 ENCOUNTER — Ambulatory Visit (INDEPENDENT_AMBULATORY_CARE_PROVIDER_SITE_OTHER): Payer: BC Managed Care – PPO | Admitting: Family Medicine

## 2022-08-03 VITALS — BP 108/60 | HR 65 | Temp 98.2°F | Resp 16 | Ht 63.0 in | Wt 198.1 lb

## 2022-08-03 DIAGNOSIS — Z13220 Encounter for screening for lipoid disorders: Secondary | ICD-10-CM

## 2022-08-03 DIAGNOSIS — Z0000 Encounter for general adult medical examination without abnormal findings: Secondary | ICD-10-CM | POA: Diagnosis not present

## 2022-08-03 DIAGNOSIS — E669 Obesity, unspecified: Secondary | ICD-10-CM | POA: Diagnosis not present

## 2022-08-03 DIAGNOSIS — G43719 Chronic migraine without aura, intractable, without status migrainosus: Secondary | ICD-10-CM

## 2022-08-03 DIAGNOSIS — F321 Major depressive disorder, single episode, moderate: Secondary | ICD-10-CM | POA: Diagnosis not present

## 2022-08-03 MED ORDER — ONDANSETRON 4 MG PO TBDP: 4.0000 mg | ORAL_TABLET | Freq: Three times a day (TID) | ORAL | 1 refills | Status: DC | PRN

## 2022-08-03 MED ORDER — TOPIRAMATE 50 MG PO TABS: ORAL_TABLET | ORAL | 5 refills | Status: DC

## 2022-08-03 NOTE — Progress Notes (Signed)
Patient ID: Tiffany Donovan, female    DOB: [birth year], 37 y.o.   MRN: [id number]  This visit was conducted in person.  BP 108/60   Pulse 65   Temp 98.2 F (36.8 C)   Resp 16   Ht '5\' 3"'$  (1.6 m)   Wt 198 lb 2 oz (89.9 kg)   LMP 07/06/2022   SpO2 98%   BMI 35.10 kg/m    CC: Chief Complaint  Patient presents with   Annual Exam    Subjective:   HPI: Tiffany Donovan is a 37 y.o. female presenting on 08/03/2022 for Annual Exam   GAD, PMDD, MDD: Well controlled on current regimen Wellbutrin SR 150 mg p.o. twice daily [place] Office Visit from 08/03/2022 in [place] at [facility]  PHQ-2 Total Score 4      Obesity   Diet:  moderate Exercise:  walking daily.Marland Kitchen acitve at work. Body mass index is 35.1 kg/m.  Wt Readings from Last 3 Encounters:  08/03/22 198 lb 2 oz (89.9 kg)  06/21/22 201 lb (91.2 kg)  01/18/22 212 lb (96.2 kg)    Migraine: Chronic, tolerable control on topiramate 50 mg 3 tablets daily.  Using Imitrex for acute migraine.   Daily milder headache , with severe HA 2 times a month.  No clear triggers.      Relevant past medical, surgical, family and social history reviewed and updated as indicated. Interim medical history since our last visit reviewed. Allergies and medications reviewed and updated. Outpatient Medications Prior to Visit  Medication Sig Dispense Refill   buPROPion (WELLBUTRIN SR) 150 MG 12 hr tablet TAKE 1 TABLET (150 MG TOTAL) BY MOUTH 2 (TWO) TIMES DAILY IN THE MORNING AND 4PM 180 tablet 1   pantoprazole (PROTONIX) 40 MG tablet TAKE 1 TABLET BY MOUTH 2 TIMES DAILY. TAKE 30-60 MIN BEFORE BREAKFAST AND DINNER. 180 tablet 1   SUMAtriptan (IMITREX) 100 MG tablet TAKE 1 TABLET BY MOUTH EVERY 2 HOURS AS NEEDED FOR MIGRAINE. MAY REPEAT IN 2 HOURS IF HEADACHE PERSISTS OR RECURS. (Patient taking differently: Take 100 mg by mouth every 2 (two) hours as needed for migraine.) 10 tablet 2   ondansetron (ZOFRAN-ODT) 4 MG  disintegrating tablet Take 1 tablet (4 mg total) by mouth every 8 (eight) hours as needed for nausea or vomiting. 10 tablet 1   topiramate (TOPAMAX) 50 MG tablet TAKE THREE TABLETS BY MOUTH ONE TIME DAILY 90 tablet 0   amoxicillin-clavulanate (AUGMENTIN) 875-125 MG tablet Take 1 tablet by mouth 2 (two) times daily. (Patient not taking: Reported on 08/03/2022) 20 tablet 0   No facility-administered medications prior to visit.     Per HPI unless specifically indicated in ROS section below Review of Systems  Constitutional:  Negative for fatigue and fever.  HENT:  Negative for congestion.   Eyes:  Negative for pain.  Respiratory:  Negative for cough and shortness of breath.   Cardiovascular:  Negative for chest pain, palpitations and leg swelling.  Gastrointestinal:  Negative for abdominal pain.  Genitourinary:  Negative for dysuria and vaginal bleeding.  Musculoskeletal:  Negative for back pain.  Neurological:  Positive for headaches. Negative for syncope and light-headedness.  Psychiatric/Behavioral:  Negative for dysphoric mood.    Objective:  BP 108/60   Pulse 65   Temp 98.2 F (36.8 C)   Resp 16   Ht '5\' 3"'$  (1.6 m)   Wt 198 lb 2 oz (89.9 kg)   LMP 07/06/2022  SpO2 98%   BMI 35.10 kg/m   Wt Readings from Last 3 Encounters:  08/03/22 198 lb 2 oz (89.9 kg)  06/21/22 201 lb (91.2 kg)  01/18/22 212 lb (96.2 kg)      Physical Exam Constitutional:      General: She is not in acute distress.    Appearance: Normal appearance. She is well-developed. She is obese. She is not ill-appearing or toxic-appearing.  HENT:     Head: Normocephalic.     Right Ear: Hearing, tympanic membrane, ear canal and external ear normal.     Left Ear: Hearing, tympanic membrane, ear canal and external ear normal.     Nose: Nose normal.  Eyes:     General: Lids are normal. Lids are everted, no foreign bodies appreciated.     Conjunctiva/sclera: Conjunctivae normal.     Pupils: Pupils are equal,  round, and reactive to light.  Neck:     Thyroid: No thyroid mass or thyromegaly.     Vascular: No carotid bruit.     Trachea: Trachea normal.  Cardiovascular:     Rate and Rhythm: Normal rate and regular rhythm.     Heart sounds: Normal heart sounds, S1 normal and S2 normal. No murmur heard.    No gallop.  Pulmonary:     Effort: Pulmonary effort is normal. No respiratory distress.     Breath sounds: Normal breath sounds. No wheezing, rhonchi or rales.  Abdominal:     General: Bowel sounds are normal. There is no distension or abdominal bruit.     Palpations: Abdomen is soft. There is no fluid wave or mass.     Tenderness: There is no abdominal tenderness. There is no guarding or rebound.     Hernia: No hernia is present.  Musculoskeletal:     Cervical back: Normal range of motion and neck supple.  Lymphadenopathy:     Cervical: No cervical adenopathy.  Skin:    General: Skin is warm and dry.     Findings: No rash.  Neurological:     Mental Status: She is alert.     Cranial Nerves: No cranial nerve deficit.     Sensory: No sensory deficit.  Psychiatric:        Mood and Affect: Mood is not anxious or depressed.        Speech: Speech normal.        Behavior: Behavior normal. Behavior is cooperative.        Judgment: Judgment normal.       Results for orders placed or performed during the hospital encounter of 09/13/21  Resp Panel by RT-PCR (Flu A&B, Covid) Nasopharyngeal Swab   Specimen: Nasopharyngeal Swab; Nasopharyngeal(NP) swabs in vial transport medium  Result Value Ref Range   SARS Coronavirus 2 by RT PCR NEGATIVE NEGATIVE   Influenza A by PCR NEGATIVE NEGATIVE   Influenza B by PCR NEGATIVE NEGATIVE  CBC with Differential  Result Value Ref Range   WBC 11.4 (H) 4.0 - 10.5 K/uL   RBC 4.31 3.87 - 5.11 MIL/uL   Hemoglobin 13.2 12.0 - 15.0 g/dL   HCT 38.7 36.0 - 46.0 %   MCV 89.8 80.0 - 100.0 fL   MCH 30.6 26.0 - 34.0 pg   MCHC 34.1 30.0 - 36.0 g/dL   RDW 12.7 11.5  - 15.5 %   Platelets 277 150 - 400 K/uL   nRBC 0.0 0.0 - 0.2 %   Neutrophils Relative % 82 %   Neutro Abs  9.3 (H) 1.7 - 7.7 K/uL   Lymphocytes Relative 11 %   Lymphs Abs 1.3 0.7 - 4.0 K/uL   Monocytes Relative 6 %   Monocytes Absolute 0.7 0.1 - 1.0 K/uL   Eosinophils Relative 1 %   Eosinophils Absolute 0.1 0.0 - 0.5 K/uL   Basophils Relative 0 %   Basophils Absolute 0.0 0.0 - 0.1 K/uL   Immature Granulocytes 0 %   Abs Immature Granulocytes 0.04 0.00 - 0.07 K/uL  Comprehensive metabolic panel  Result Value Ref Range   Sodium 135 135 - 145 mmol/L   Potassium 3.7 3.5 - 5.1 mmol/L   Chloride 107 98 - 111 mmol/L   CO2 22 22 - 32 mmol/L   Glucose, Bld 98 70 - 99 mg/dL   BUN 12 6 - 20 mg/dL   Creatinine, Ser 0.77 0.44 - 1.00 mg/dL   Calcium 8.6 (L) 8.9 - 10.3 mg/dL   Total Protein 7.3 6.5 - 8.1 g/dL   Albumin 4.0 3.5 - 5.0 g/dL   AST 22 15 - 41 U/L   ALT 27 0 - 44 U/L   Alkaline Phosphatase 51 38 - 126 U/L   Total Bilirubin 0.7 0.3 - 1.2 mg/dL   GFR, Estimated >60 >60 mL/min   Anion gap 6 5 - 15  Lipase, blood  Result Value Ref Range   Lipase 26 11 - 51 U/L  Lactic acid, plasma  Result Value Ref Range   Lactic Acid, Venous 0.5 0.5 - 1.9 mmol/L  I-Stat Beta hCG blood, ED ([facility], [facility], [facility] only)  Result Value Ref Range   I-stat hCG, quantitative <5.0 <5 mIU/mL   Comment 3            This visit occurred during the SARS-CoV-2 public health emergency.  Safety protocols were in place, including screening questions prior to the visit, additional usage of staff PPE, and extensive cleaning of exam room while observing appropriate contact time as indicated for disinfecting solutions.   COVID 19 screen:  No recent travel or known exposure to COVID19 The patient denies respiratory symptoms of COVID 19 at this time. The importance of social distancing was discussed today.   Assessment and Plan The patient's preventative maintenance and recommended screening tests for an annual wellness exam  were reviewed in full today. Brought up to date unless services declined.  Counselled on the importance of diet, exercise, and its role in overall health and mortality. The patient's FH and SH was reviewed, including their home life, tobacco status, and drug and alcohol status.     Vaccines:  COVID x 4,  uptodate with td Pap/DVE:  2023 ASCUS per Dr. Threasa Beards, repeat in 1 year. Mammo: no early family history of breast cancer ( PGF may have had breast cancer)..  Colon:  No early family history CRC Smoking Status: none ETOH/ drug use: occ/ none HIV screen:   done Hep C: done  Problem List Items Addressed This Visit     Intractable chronic migraine without aura and without status migrainosus    Stable, chronic.  Continue current medication.  Topiramate 150 mg p.o. daily still with headaches daily milder, but 2 or less migraines per month. Using Imitrex 100 mg p.o. as needed migraine.      Relevant Medications   topiramate (TOPAMAX) 50 MG tablet   Moderate major depression (HCC)    Stable, chronic.  Continue current medication.  Wellbutrin SR 150 mg BID.      Obesity (BMI 35.0-39.9  without comorbidity)    Encouraged exercise, weight loss, healthy eating habits.       RESOLVED: Routine general medical examination at a health care facility - Primary   Other Visit Diagnoses     Screening cholesterol level       Relevant Orders   Lipid panel   Comprehensive metabolic panel      Eliezer Lofts, MD

## 2022-08-03 NOTE — Patient Instructions (Addendum)
Call if interested in increasing the dose of topiramate for improved migraine control.  Please stop at the lab to have labs drawn.

## 2022-08-03 NOTE — Assessment & Plan Note (Signed)
Stable, chronic.  Continue current medication.  Wellbutrin SR 150 mg BID.

## 2022-08-03 NOTE — Assessment & Plan Note (Signed)
Stable, chronic.  Continue current medication.  Topiramate 150 mg p.o. daily still with headaches daily milder, but 2 or less migraines per month. Using Imitrex 100 mg p.o. as needed migraine.

## 2022-08-03 NOTE — Assessment & Plan Note (Signed)
Encouraged exercise, weight loss, healthy eating habits. ? ?

## 2022-08-04 LAB — COMPREHENSIVE METABOLIC PANEL
ALT: 8 U/L (ref 0–35)
AST: 13 U/L (ref 0–37)
Albumin: 4.2 g/dL (ref 3.5–5.2)
Alkaline Phosphatase: 38 U/L — ABNORMAL LOW (ref 39–117)
BUN: 18 mg/dL (ref 6–23)
CO2: 24 mEq/L (ref 19–32)
Calcium: 8.5 mg/dL (ref 8.4–10.5)
Chloride: 108 mEq/L (ref 96–112)
Creatinine, Ser: 0.78 mg/dL (ref 0.40–1.20)
GFR: 97.51 mL/min (ref >60.00)
Glucose, Bld: 77 mg/dL (ref 70–99)
Potassium: 3.7 mEq/L (ref 3.5–5.1)
Sodium: 139 mEq/L (ref 135–145)
Total Bilirubin: 0.5 mg/dL (ref 0.2–1.2)
Total Protein: 6.7 g/dL (ref 6.0–8.3)

## 2022-08-04 LAB — LIPID PANEL
Cholesterol: 159 mg/dL (ref 0–200)
HDL: 64.9 mg/dL (ref >39.00)
LDL Cholesterol: 88 mg/dL (ref 0–99)
NonHDL: 93.68
Total CHOL/HDL Ratio: 2
Triglycerides: 30 mg/dL (ref 0.0–149.0)
VLDL: 6 mg/dL (ref 0.0–40.0)

## 2022-08-08 ENCOUNTER — Ambulatory Visit: Payer: BC Managed Care – PPO | Admitting: Gastroenterology

## 2022-08-31 ENCOUNTER — Other Ambulatory Visit: Payer: Self-pay | Admitting: *Deleted

## 2022-08-31 DIAGNOSIS — F418 Other specified anxiety disorders: Secondary | ICD-10-CM

## 2022-08-31 MED ORDER — BUPROPION HCL ER (SR) 150 MG PO TB12: 150.0000 mg | ORAL_TABLET | Freq: Two times a day (BID) | ORAL | 1 refills | Status: DC

## 2022-10-01 ENCOUNTER — Encounter: Payer: Self-pay | Admitting: Family Medicine

## 2022-10-02 NOTE — Telephone Encounter (Signed)
Last office visit 08/03/2022 for CPE.  Last refilled 08/03/2022 for #90 with 5 refills with instructions to take 3 tablets daily.  Patient states she is currently taking 4 capsules daily.  Please sign order below if increase is appropriate.

## 2022-10-03 MED ORDER — TOPIRAMATE 50 MG PO TABS: ORAL_TABLET | ORAL | 5 refills | Status: DC

## 2022-10-12 ENCOUNTER — Ambulatory Visit: Payer: BC Managed Care – PPO | Admitting: Family Medicine

## 2022-10-12 ENCOUNTER — Encounter: Payer: Self-pay | Admitting: Family Medicine

## 2022-10-12 VITALS — BP 126/72 | HR 67 | Temp 97.3°F | Ht 63.0 in | Wt 196.4 lb

## 2022-10-12 DIAGNOSIS — F411 Generalized anxiety disorder: Secondary | ICD-10-CM

## 2022-10-12 DIAGNOSIS — Z6841 Body Mass Index (BMI) 40.0 and over, adult: Secondary | ICD-10-CM

## 2022-10-12 DIAGNOSIS — F321 Major depressive disorder, single episode, moderate: Secondary | ICD-10-CM

## 2022-10-12 DIAGNOSIS — J301 Allergic rhinitis due to pollen: Secondary | ICD-10-CM | POA: Diagnosis not present

## 2022-10-12 DIAGNOSIS — F418 Other specified anxiety disorders: Secondary | ICD-10-CM | POA: Diagnosis not present

## 2022-10-12 MED ORDER — AZELASTINE HCL 0.05 % OP SOLN: 1.0000 [drp] | Freq: Two times a day (BID) | OPHTHALMIC | 12 refills | Status: DC

## 2022-10-12 MED ORDER — BUPROPION HCL ER (XL) 300 MG PO TB24: 300.0000 mg | ORAL_TABLET | Freq: Every day | ORAL | 3 refills | Status: DC

## 2022-10-12 NOTE — Assessment & Plan Note (Addendum)
Chronic with acute worsening.  Will treat with antihistamine eyedrops, Flonase 2 sprays per nostril daily, Xyzal p.o. nightly No sign of viral or bacterial involvement.   If not improving as expected we will consider referral to allergist.

## 2022-10-12 NOTE — Progress Notes (Signed)
Patient ID: Tiffany Donovan, female    DOB: Feb 16, 1986, 37 y.o.   MRN: [id number]  This visit was conducted in person.  BP 126/72   Pulse 67   Temp (!) 97.3 F (36.3 C) (Temporal)   Ht 5\' 3"  (1.6 m)   Wt 196 lb 6 oz (89.1 kg)   LMP 10/04/2022   SpO2 99%   BMI 34.79 kg/m    CC:  Chief Complaint  Patient presents with   Allergies    C/o nasal congestion/drainage, watery/scratchy eyes and migraines. Also, c/o mood issues and crying often.     Subjective:   HPI: Tiffany Donovan is a 37 y.o. female presenting on 10/12/2022 for Allergies (C/o nasal congestion/drainage, watery/scratchy eyes and migraines. Also, c/o mood issues and crying often. ) In the last  3 weeks she has been bothered by nasal congestion and drainage, watery scratchy eyes, sneezing and increase in headaches. PND and scratchy throat... worse at night. No face pain. No fever. No SOB, occ wheeze She has tried OTC cold meds.. flonase off and on and zyrtec.   MDD: She had previously been well-controlled on Wellbutrin SR 150 mg p.o. twice daily  She has been crying more often lately.  Not seeing a counselor.    10/12/2022   11:34 AM 08/03/2022    2:38 PM 03/10/2021   12:20 PM  Depression screen PHQ 2/9  Decreased Interest 2 2 0  Down, Depressed, Hopeless 2 2 0  PHQ - 2 Score 4 4 0  Altered sleeping 2 3 2   Tired, decreased energy 2 3 2   Change in appetite 2 0 0  Feeling bad or failure about yourself  2 1 0  Trouble concentrating 2 2 0  Moving slowly or fidgety/restless 0 0 0  Suicidal thoughts 0 0 0  PHQ-9 Score 14 13 4   Difficult doing work/chores Very difficult Somewhat difficult Somewhat difficult      10/12/2022   11:34 AM 08/03/2022    2:39 PM 03/10/2021   12:20 PM 11/28/2018   11:20 AM  GAD 7 : Generalized Anxiety Score  Nervous, Anxious, on Edge 2 2 0 2  Control/stop worrying 2 2 0 2  Worry too much - different things 2 2 0 2  Trouble relaxing 1 2 0 0  Restless 0 1 0 1  Easily annoyed or  irritable 2 1 0 3  Afraid - awful might happen 0 1 0 1  Total GAD 7 Score 9 11 0 11  Anxiety Difficulty Very difficult Very difficult  Somewhat difficult    Reviewed past office visits and medication changes for depression.  She has seen a therapist and psychiatrist in the past.       Relevant past medical, surgical, family and social history reviewed and updated as indicated. Interim medical history since our last visit reviewed. Allergies and medications reviewed and updated. Outpatient Medications Prior to Visit  Medication Sig Dispense Refill   ondansetron (ZOFRAN-ODT) 4 MG disintegrating tablet Take 1 tablet (4 mg total) by mouth every 8 (eight) hours as needed for nausea or vomiting. 10 tablet 1   pantoprazole (PROTONIX) 40 MG tablet TAKE 1 TABLET BY MOUTH 2 TIMES DAILY. TAKE 30-60 MIN BEFORE BREAKFAST AND DINNER. 180 tablet 1   SUMAtriptan (IMITREX) 100 MG tablet TAKE 1 TABLET BY MOUTH EVERY 2 HOURS AS NEEDED FOR MIGRAINE. MAY REPEAT IN 2 HOURS IF HEADACHE PERSISTS OR RECURS. 10 tablet 2   topiramate (TOPAMAX) 50 MG  tablet TAKE FOUR TABLETS BY MOUTH ONE TIME DAILY 120 tablet 5   buPROPion (WELLBUTRIN SR) 150 MG 12 hr tablet Take 1 tablet (150 mg total) by mouth 2 (two) times daily. 180 tablet 1   No facility-administered medications prior to visit.     Per HPI unless specifically indicated in ROS section below Review of Systems  Constitutional:  Negative for fatigue and fever.  HENT:  Positive for congestion, postnasal drip and rhinorrhea. Negative for ear pain and sinus pain.   Eyes:  Negative for pain.  Respiratory:  Positive for cough. Negative for shortness of breath.   Cardiovascular:  Negative for chest pain, palpitations and leg swelling.  Gastrointestinal:  Negative for abdominal pain.  Genitourinary:  Negative for dysuria and vaginal bleeding.  Musculoskeletal:  Negative for back pain.  Neurological:  Negative for syncope, light-headedness and headaches.   Psychiatric/Behavioral:  Positive for dysphoric mood. Negative for self-injury, sleep disturbance and suicidal ideas. The patient is nervous/anxious.    Objective:  BP 126/72   Pulse 67   Temp (!) 97.3 F (36.3 C) (Temporal)   Ht 5\' 3"  (1.6 m)   Wt 196 lb 6 oz (89.1 kg)   LMP 10/04/2022   SpO2 99%   BMI 34.79 kg/m   Wt Readings from Last 3 Encounters:  10/12/22 196 lb 6 oz (89.1 kg)  08/03/22 198 lb 2 oz (89.9 kg)  06/21/22 201 lb (91.2 kg)      Physical Exam Constitutional:      General: She is not in acute distress.    Appearance: Normal appearance. She is well-developed. She is not ill-appearing or toxic-appearing.  HENT:     Head: Normocephalic.     Right Ear: Hearing, tympanic membrane, ear canal and external ear normal. No middle ear effusion. Tympanic membrane is not erythematous, retracted or bulging.     Left Ear: Hearing, tympanic membrane, ear canal and external ear normal.  No middle ear effusion. Tympanic membrane is not erythematous, retracted or bulging.     Nose: Mucosal edema and congestion present. No rhinorrhea.     Right Turbinates: Swollen and pale.     Left Turbinates: Swollen and pale.     Right Sinus: No maxillary sinus tenderness or frontal sinus tenderness.     Left Sinus: No maxillary sinus tenderness or frontal sinus tenderness.     Mouth/Throat:     Pharynx: Uvula midline.  Eyes:     General: Lids are normal. Lids are everted, no foreign bodies appreciated.     Conjunctiva/sclera: Conjunctivae normal.     Pupils: Pupils are equal, round, and reactive to light.  Neck:     Thyroid: No thyroid mass or thyromegaly.     Vascular: No carotid bruit.     Trachea: Trachea normal.  Cardiovascular:     Rate and Rhythm: Normal rate and regular rhythm.     Pulses: Normal pulses.     Heart sounds: Normal heart sounds, S1 normal and S2 normal. No murmur heard.    No friction rub. No gallop.  Pulmonary:     Effort: Pulmonary effort is normal. No  tachypnea or respiratory distress.     Breath sounds: Normal breath sounds. No decreased breath sounds, wheezing, rhonchi or rales.  Abdominal:     General: Bowel sounds are normal.     Palpations: Abdomen is soft.     Tenderness: There is no abdominal tenderness.  Musculoskeletal:     Cervical back: Normal range of motion  and neck supple.  Skin:    General: Skin is warm and dry.     Findings: No rash.  Neurological:     Mental Status: She is alert.  Psychiatric:        Mood and Affect: Mood is not anxious or depressed.        Speech: Speech normal.        Behavior: Behavior normal. Behavior is cooperative.        Thought Content: Thought content normal.        Judgment: Judgment normal.       Results for orders placed or performed in visit on 08/03/22  Lipid panel  Result Value Ref Range   Cholesterol 159 0 - 200 mg/dL   Triglycerides 30.0 0.0 - 149.0 mg/dL   HDL 64.90 >39.00 mg/dL   VLDL 6.0 0.0 - 40.0 mg/dL   LDL Cholesterol 88 0 - 99 mg/dL   Total CHOL/HDL Ratio 2    NonHDL 93.68   Comprehensive metabolic panel  Result Value Ref Range   Sodium 139 135 - 145 mEq/L   Potassium 3.7 3.5 - 5.1 mEq/L   Chloride 108 96 - 112 mEq/L   CO2 24 19 - 32 mEq/L   Glucose, Bld 77 70 - 99 mg/dL   BUN 18 6 - 23 mg/dL   Creatinine, Ser 0.78 0.40 - 1.20 mg/dL   Total Bilirubin 0.5 0.2 - 1.2 mg/dL   Alkaline Phosphatase 38 (L) 39 - 117 U/L   AST 13 0 - 37 U/L   ALT 8 0 - 35 U/L   Total Protein 6.7 6.0 - 8.3 g/dL   Albumin 4.2 3.5 - 5.2 g/dL   GFR 97.51 >60.00 mL/min   Calcium 8.5 8.4 - 10.5 mg/dL    Assessment and Plan  Seasonal allergic rhinitis due to pollen Assessment & Plan:  Chronic with acute worsening.  Will treat with antihistamine eyedrops, Flonase 2 sprays per nostril daily, Xyzal p.o. nightly No sign of viral or bacterial involvement.   If not improving as expected we will consider referral to allergist.   GAD (generalized anxiety disorder)  Moderate major  depression ([place]) Assessment & Plan:  Acute worsening of chronic issue.   Restart counseling.. she will call.. has available at work.  Increase wellbutrin to 300 mg and change to XR. Follow-up in 4 weeks for reevaluation   Situational anxiety  Class 3 severe obesity due to excess calories without serious comorbidity with body mass index (BMI) of 40.0 to 44.9 in adult [facility])  Other orders -     buPROPion HCl ER (XL); Take 1 tablet (300 mg total) by mouth daily.  Dispense: 30 tablet; Refill: 3 -     Azelastine HCl; Place 1 drop into both eyes 2 (two) times daily.  Dispense: 6 mL; Refill: 12    Return in about 4 weeks (around 11/09/2022) for  follow up mood.   Eliezer Lofts, MD

## 2022-10-12 NOTE — Assessment & Plan Note (Signed)
Acute worsening of chronic issue.   Restart counseling.. she will call.. has available at work.  Increase wellbutrin to 300 mg and change to XR. Follow-up in 4 weeks for reevaluation

## 2022-10-12 NOTE — Patient Instructions (Addendum)
Stop Wellbutrin 150 mg SR twice daily and change Wellbutrin XL 300 mg ONCE daily.  Start Xyzal at bedtime. Start allergy eye drops  Flonase 2 spray per nostril daily.  Call if facial pain or fever or if not improving as expected.  GO to Er if severe shortness of breath.

## 2023-01-03 ENCOUNTER — Ambulatory Visit: Payer: BC Managed Care – PPO | Admitting: Family Medicine

## 2023-01-03 ENCOUNTER — Encounter: Payer: Self-pay | Admitting: Family Medicine

## 2023-01-03 VITALS — BP 120/82 | HR 71 | Temp 98.6°F | Ht 63.0 in | Wt 195.0 lb

## 2023-01-03 DIAGNOSIS — J0100 Acute maxillary sinusitis, unspecified: Secondary | ICD-10-CM

## 2023-01-03 MED ORDER — AMOXICILLIN 500 MG PO CAPS: 1000.0000 mg | ORAL_CAPSULE | Freq: Two times a day (BID) | ORAL | 0 refills | Status: DC

## 2023-01-03 MED ORDER — AZELASTINE HCL 0.05 % OP SOLN: 1.0000 [drp] | Freq: Two times a day (BID) | OPHTHALMIC | 12 refills | Status: DC

## 2023-01-03 NOTE — Progress Notes (Signed)
Patient ID: Tiffany Donovan, female    DOB: 11-19-1985, 37 y.o.   MRN: [id number]  This visit was conducted in person.  BP 120/82 (BP Location: Left Arm, Patient Position: Sitting, Cuff Size: Normal)   Pulse 71   Temp 98.6 F (37 C) (Temporal)   Ht 5\' 3"  (1.6 m)   Wt 195 lb (88.5 kg)   SpO2 98%   BMI 34.54 kg/m    CC:  Chief Complaint  Patient presents with   Cough    Nasal/sinus congestion; ear and head pressure, itchy watery eyes since last Wednesday. Patient has been taking advil for colds and mucinex cold and flu.    Subjective:   HPI: Tiffany Donovan is a 37 y.o. female presenting on 01/03/2023 for Cough (Nasal/sinus congestion; ear and head pressure, itchy watery eyes since last Wednesday. Patient has been taking advil for colds and mucinex cold and flu.)   Date of onset:  1 week Initial symptoms included nasal congestion, runny , itchy eyes, ear pressure, head pressure, sneeze Symptoms progressed to productive cough, post nasal drip and sinus pain.  No ST. No fever.. feels hot and chills.   Sick contacts:  At work COVID testing:   negative  day 1 and day 4     She has tried to treat with Advil cold and sinus, Mucinex cold and flu   Using Xyzal for allergies.  Using flonase 2 sprays per nostril daily.   No history of chronic lung disease such as asthma or COPD. Non-smoker.   No antibitoics in alst 30 days.       Relevant past medical, surgical, family and social history reviewed and updated as indicated. Interim medical history since our last visit reviewed. Allergies and medications reviewed and updated. Outpatient Medications Prior to Visit  Medication Sig Dispense Refill   buPROPion (WELLBUTRIN XL) 300 MG 24 hr tablet Take 1 tablet (300 mg total) by mouth daily. 30 tablet 3   ondansetron (ZOFRAN-ODT) 4 MG disintegrating tablet Take 1 tablet (4 mg total) by mouth every 8 (eight) hours as needed for nausea or vomiting. 10 tablet 1   pantoprazole  (PROTONIX) 40 MG tablet TAKE 1 TABLET BY MOUTH 2 TIMES DAILY. TAKE 30-60 MIN BEFORE BREAKFAST AND DINNER. 180 tablet 1   SUMAtriptan (IMITREX) 100 MG tablet TAKE 1 TABLET BY MOUTH EVERY 2 HOURS AS NEEDED FOR MIGRAINE. MAY REPEAT IN 2 HOURS IF HEADACHE PERSISTS OR RECURS. 10 tablet 2   topiramate (TOPAMAX) 50 MG tablet TAKE FOUR TABLETS BY MOUTH ONE TIME DAILY 120 tablet 5   azelastine (OPTIVAR) 0.05 % ophthalmic solution Place 1 drop into both eyes 2 (two) times daily. 6 mL 12   No facility-administered medications prior to visit.     Per HPI unless specifically indicated in ROS section below Review of Systems  Constitutional:  Negative for fatigue and fever.  HENT:  Positive for congestion, postnasal drip, sinus pressure and sinus pain.   Eyes:  Negative for pain.  Respiratory:  Positive for cough. Negative for shortness of breath.   Cardiovascular:  Negative for chest pain, palpitations and leg swelling.  Gastrointestinal:  Negative for abdominal pain.  Genitourinary:  Negative for dysuria and vaginal bleeding.  Musculoskeletal:  Negative for back pain.  Neurological:  Negative for syncope, light-headedness and headaches.  Psychiatric/Behavioral:  Negative for dysphoric mood.    Objective:  BP 120/82 (BP Location: Left Arm, Patient Position: Sitting, Cuff Size: Normal)   Pulse 71  Temp 98.6 F (37 C) (Temporal)   Ht 5\' 3"  (1.6 m)   Wt 195 lb (88.5 kg)   SpO2 98%   BMI 34.54 kg/m   Wt Readings from Last 3 Encounters:  01/03/23 195 lb (88.5 kg)  10/12/22 196 lb 6 oz (89.1 kg)  08/03/22 198 lb 2 oz (89.9 kg)      Physical Exam Constitutional:      General: She is not in acute distress.    Appearance: Normal appearance. She is well-developed. She is not ill-appearing or toxic-appearing.  HENT:     Head: Normocephalic.     Right Ear: Hearing, ear canal and external ear normal. A middle ear effusion is present. Tympanic membrane is not erythematous, retracted or bulging.      Left Ear: Hearing, ear canal and external ear normal. A middle ear effusion is present. Tympanic membrane is not erythematous, retracted or bulging.     Nose: No mucosal edema or rhinorrhea.     Right Sinus: Maxillary sinus tenderness present. No frontal sinus tenderness.     Left Sinus: Maxillary sinus tenderness present. No frontal sinus tenderness.     Mouth/Throat:     Pharynx: Uvula midline.  Eyes:     General: Lids are normal. Lids are everted, no foreign bodies appreciated.     Conjunctiva/sclera: Conjunctivae normal.     Pupils: Pupils are equal, round, and reactive to light.  Neck:     Thyroid: No thyroid mass or thyromegaly.     Vascular: No carotid bruit.     Trachea: Trachea normal.  Cardiovascular:     Rate and Rhythm: Normal rate and regular rhythm.     Pulses: Normal pulses.     Heart sounds: Normal heart sounds, S1 normal and S2 normal. No murmur heard.    No friction rub. No gallop.  Pulmonary:     Effort: Pulmonary effort is normal. No tachypnea or respiratory distress.     Breath sounds: Normal breath sounds. No decreased breath sounds, wheezing, rhonchi or rales.  Abdominal:     General: Bowel sounds are normal.     Palpations: Abdomen is soft.     Tenderness: There is no abdominal tenderness.  Musculoskeletal:     Cervical back: Normal range of motion and neck supple.  Skin:    General: Skin is warm and dry.     Findings: No rash.  Neurological:     Mental Status: She is alert.  Psychiatric:        Mood and Affect: Mood is not anxious or depressed.        Speech: Speech normal.        Behavior: Behavior normal. Behavior is cooperative.        Thought Content: Thought content normal.        Judgment: Judgment normal.       Results for orders placed or performed in visit on 08/03/22  Lipid panel  Result Value Ref Range   Cholesterol 159 0 - 200 mg/dL   Triglycerides 16.1 0.0 - 149.0 mg/dL   HDL 09.60 >45.40 mg/dL   VLDL 6.0 0.0 - 98.1 mg/dL   LDL  Cholesterol 88 0 - 99 mg/dL   Total CHOL/HDL Ratio 2    NonHDL 93.68   Comprehensive metabolic panel  Result Value Ref Range   Sodium 139 135 - 145 mEq/L   Potassium 3.7 3.5 - 5.1 mEq/L   Chloride 108 96 - 112 mEq/L   CO2 24 19 -  32 mEq/L   Glucose, Bld 77 70 - 99 mg/dL   BUN 18 6 - 23 mg/dL   Creatinine, Ser 1.61 0.40 - 1.20 mg/dL   Total Bilirubin 0.5 0.2 - 1.2 mg/dL   Alkaline Phosphatase 38 (L) 39 - 117 U/L   AST 13 0 - 37 U/L   ALT 8 0 - 35 U/L   Total Protein 6.7 6.0 - 8.3 g/dL   Albumin 4.2 3.5 - 5.2 g/dL   GFR 09.60 >45.40 mL/min   Calcium 8.5 8.4 - 10.5 mg/dL    Assessment and Plan  Acute non-recurrent maxillary sinusitis Assessment & Plan: Acute, greater than 7 to 10 days of symptoms, concerning for bacterial superinfection on top of allergic sinusitis.  Continue Xyzal, Flonase 2 sprays per nostril daily and Optivar.  Start nasal saline irrigation.  If not improving as expected start amoxicillin 500 mg 2 tabs p.o. twice daily x 10 days.  ER and return precautions provided.   Other orders -     Azelastine HCl; Place 1 drop into both eyes 2 (two) times daily.  Dispense: 6 mL; Refill: 12 -     Amoxicillin; Take 2 capsules (1,000 mg total) by mouth 2 (two) times daily.  Dispense: 40 capsule; Refill: 0    No follow-ups on file.   Kerby Nora, MD

## 2023-01-03 NOTE — Assessment & Plan Note (Signed)
Acute, greater than 7 to 10 days of symptoms, concerning for bacterial superinfection on top of allergic sinusitis.  Continue Xyzal, Flonase 2 sprays per nostril daily and Optivar.  Start nasal saline irrigation.  If not improving as expected start amoxicillin 500 mg 2 tabs p.o. twice daily x 10 days.  ER and return precautions provided.

## 2023-03-09 ENCOUNTER — Other Ambulatory Visit: Payer: Self-pay | Admitting: Family Medicine

## 2023-03-16 ENCOUNTER — Other Ambulatory Visit: Payer: Self-pay

## 2023-03-16 MED ORDER — PANTOPRAZOLE SODIUM 40 MG PO TBEC: 40.0000 mg | DELAYED_RELEASE_TABLET | Freq: Two times a day (BID) | ORAL | 0 refills | Status: DC

## 2023-03-23 ENCOUNTER — Ambulatory Visit: Payer: BC Managed Care – PPO | Admitting: Physician Assistant

## 2023-03-24 ENCOUNTER — Other Ambulatory Visit: Payer: Self-pay | Admitting: Family Medicine

## 2023-03-27 NOTE — Telephone Encounter (Signed)
Last office visit 01/03/2023 for sinusitis  Last refilled 08/03/2022 for #10 with 1 refill.  Next Appt: No future appointments.

## 2023-04-03 ENCOUNTER — Ambulatory Visit: Payer: BC Managed Care – PPO | Admitting: Family Medicine

## 2023-04-11 ENCOUNTER — Encounter: Payer: Self-pay | Admitting: Family Medicine

## 2023-04-11 ENCOUNTER — Ambulatory Visit: Payer: BC Managed Care – PPO | Admitting: Family Medicine

## 2023-04-11 VITALS — BP 116/80 | HR 65 | Temp 97.8°F | Ht 63.0 in | Wt 187.0 lb

## 2023-04-11 DIAGNOSIS — F321 Major depressive disorder, single episode, moderate: Secondary | ICD-10-CM

## 2023-04-11 DIAGNOSIS — G43719 Chronic migraine without aura, intractable, without status migrainosus: Secondary | ICD-10-CM | POA: Diagnosis not present

## 2023-04-11 MED ORDER — SUMATRIPTAN SUCCINATE 100 MG PO TABS: 100.0000 mg | ORAL_TABLET | Freq: Once | ORAL | 2 refills | Status: DC

## 2023-04-11 MED ORDER — ATENOLOL 25 MG PO TABS: 25.0000 mg | ORAL_TABLET | Freq: Every day | ORAL | 11 refills | Status: DC

## 2023-04-11 NOTE — Assessment & Plan Note (Addendum)
Chronic, inadequate control on Wellbutrin XL 300 mg p.o. daily.  She has been intolerant of multiple SSRIs and SNRI in the past. She would like to avoid any medication that could potentially cause weight gain given her father's history of morbid obesity.  Offered counseling, she will consider. Mood may improve now that working a different schedule to allow her space when at home.

## 2023-04-11 NOTE — Assessment & Plan Note (Signed)
Chronic, acute worsening now with daily headache Possible daily medication overuse headache.  Encouraged her to stop using over-the-counter medication for her washout..  Work on stress reduction, get adequate sleep and increase hydration. We will add atenolol 25 mg p.o. nightly.  Hopefully we will be able to decrease and wean off her topiramate but as she is not having any side effects to this we will continue it until we know that symptoms are improving with atenolol.  Discussed using Imitrex with possible repeated dose if migraine not resolved. She will follow-up in 4-weeks for reevaluation.

## 2023-04-11 NOTE — Progress Notes (Signed)
Patient ID: Tiffany Donovan, female    DOB: Jan 14, 1986, 37 y.o.   MRN: [id number]  This visit was conducted in person.  BP 116/80 (BP Location: Left Arm, Patient Position: Sitting, Cuff Size: Normal)   Pulse 65   Temp 97.8 F (36.6 C) (Temporal)   Ht 5\' 3"  (1.6 m)   Wt 187 lb (84.8 kg)   SpO2 99%   BMI 33.13 kg/m    CC:  Chief Complaint  Patient presents with   Migraine    Subjective:   HPI: Tiffany Donovan is a 37 y.o. female presenting on 04/11/2023 for Migraine Okay alright okay yeah I can use all you need like my mom will follow it is still not regularly I will get all like before I hear him I do not want you here problem: Follow-up okay yeah yeah I will In the past migraines have been stably controlled on topiramate 200 mg daily.  Now in the last several months she has noted an increase in migraine frequency.  Severe headache bilateral temples, associated     Moderate HA are now daily...  has tried occ OTC meds, goody's or BC.   Progresses to migraine with light sensitivity and severity about 2 time a week.  Imitrex not helping at all.    She has had an increase in time out of work due to migraines. She feels increase in frequency is secondary to increased stress.  Major depression continues to be treated with Wellbutrin  XR 300 mg p.o. daily    Breaking up with significant other, having to live together.  SE to paxil, effexor and cymbalta in past.  PHQ9 13  GAD7 14  Relevant past medical, surgical, family and social history reviewed and updated as indicated. Interim medical history since our last visit reviewed. Allergies and medications reviewed and updated. Outpatient Medications Prior to Visit  Medication Sig Dispense Refill   azelastine (OPTIVAR) 0.05 % ophthalmic solution Place 1 drop into both eyes 2 (two) times daily. 6 mL 12   buPROPion (WELLBUTRIN XL) 300 MG 24 hr tablet TAKE ONE TABLET BY MOUTH ONE TIME DAILY 30 tablet 3   ondansetron (ZOFRAN-ODT) 4 MG  disintegrating tablet PLACE ONE TABLET ON THE TONGUE EVERY 8 HOURS AS NEEDED FOR NAUSEA AND VOMITING 10 tablet 1   pantoprazole (PROTONIX) 40 MG tablet Take 1 tablet (40 mg total) by mouth 2 (two) times daily. Please call [phone] to schedule an office visit for more refills. 180 tablet 0   topiramate (TOPAMAX) 50 MG tablet TAKE FOUR TABLETS BY MOUTH ONE TIME DAILY 120 tablet 5   SUMAtriptan (IMITREX) 100 MG tablet TAKE 1 TABLET BY MOUTH EVERY 2 HOURS AS NEEDED FOR MIGRAINE. MAY REPEAT IN 2 HOURS IF HEADACHE PERSISTS OR RECURS. 10 tablet 2   amoxicillin (AMOXIL) 500 MG capsule Take 2 capsules (1,000 mg total) by mouth 2 (two) times daily. 40 capsule 0   No facility-administered medications prior to visit.     Per HPI unless specifically indicated in ROS section below Review of Systems  Constitutional:  Negative for fatigue and fever.  HENT:  Negative for congestion.   Eyes:  Negative for pain.  Respiratory:  Negative for cough and shortness of breath.   Cardiovascular:  Negative for chest pain, palpitations and leg swelling.  Gastrointestinal:  Negative for abdominal pain.  Genitourinary:  Negative for dysuria and vaginal bleeding.  Musculoskeletal:  Negative for back pain.  Neurological:  Positive for headaches. Negative  for syncope and light-headedness.  Psychiatric/Behavioral:  Negative for dysphoric mood.    Objective:  BP 116/80 (BP Location: Left Arm, Patient Position: Sitting, Cuff Size: Normal)   Pulse 65   Temp 97.8 F (36.6 C) (Temporal)   Ht 5\' 3"  (1.6 m)   Wt 187 lb (84.8 kg)   SpO2 99%   BMI 33.13 kg/m   Wt Readings from Last 3 Encounters:  04/11/23 187 lb (84.8 kg)  01/03/23 195 lb (88.5 kg)  10/12/22 196 lb 6 oz (89.1 kg)      Physical Exam Constitutional:      General: She is not in acute distress.    Appearance: Normal appearance. She is well-developed. She is not ill-appearing or toxic-appearing.  HENT:     Head: Normocephalic.     Right Ear: Hearing,  tympanic membrane, ear canal and external ear normal. Tympanic membrane is not erythematous, retracted or bulging.     Left Ear: Hearing, tympanic membrane, ear canal and external ear normal. Tympanic membrane is not erythematous, retracted or bulging.     Nose: No mucosal edema or rhinorrhea.     Right Sinus: No maxillary sinus tenderness or frontal sinus tenderness.     Left Sinus: No maxillary sinus tenderness or frontal sinus tenderness.     Mouth/Throat:     Mouth: Oropharynx is clear and moist and mucous membranes are normal.     Pharynx: Uvula midline.  Eyes:     General: Lids are normal. Lids are everted, no foreign bodies appreciated.     Extraocular Movements: EOM normal.     Conjunctiva/sclera: Conjunctivae normal.     Pupils: Pupils are equal, round, and reactive to light.  Neck:     Thyroid: No thyroid mass or thyromegaly.     Vascular: No carotid bruit.     Trachea: Trachea normal.  Cardiovascular:     Rate and Rhythm: Normal rate and regular rhythm.     Pulses: Normal pulses.     Heart sounds: Normal heart sounds, S1 normal and S2 normal. No murmur heard.    No friction rub. No gallop.  Pulmonary:     Effort: Pulmonary effort is normal. No tachypnea or respiratory distress.     Breath sounds: Normal breath sounds. No decreased breath sounds, wheezing, rhonchi or rales.  Abdominal:     General: Bowel sounds are normal.     Palpations: Abdomen is soft.     Tenderness: There is no abdominal tenderness.  Musculoskeletal:     Cervical back: Normal range of motion and neck supple.  Skin:    General: Skin is warm, dry and intact.     Findings: No rash.  Neurological:     Mental Status: She is alert.  Psychiatric:        Mood and Affect: Mood is not anxious or depressed.        Speech: Speech normal.        Behavior: Behavior normal. Behavior is cooperative.        Thought Content: Thought content normal.        Cognition and Memory: Cognition and memory normal.         Judgment: Judgment normal.       Results for orders placed or performed in visit on 08/03/22  Lipid panel  Result Value Ref Range   Cholesterol 159 0 - 200 mg/dL   Triglycerides 16.1 0.0 - 149.0 mg/dL   HDL 09.60 >45.40 mg/dL   VLDL 6.0 0.0 -  40.0 mg/dL   LDL Cholesterol 88 0 - 99 mg/dL   Total CHOL/HDL Ratio 2    NonHDL 93.68   Comprehensive metabolic panel  Result Value Ref Range   Sodium 139 135 - 145 mEq/L   Potassium 3.7 3.5 - 5.1 mEq/L   Chloride 108 96 - 112 mEq/L   CO2 24 19 - 32 mEq/L   Glucose, Bld 77 70 - 99 mg/dL   BUN 18 6 - 23 mg/dL   Creatinine, Ser 8.46 0.40 - 1.20 mg/dL   Total Bilirubin 0.5 0.2 - 1.2 mg/dL   Alkaline Phosphatase 38 (L) 39 - 117 U/L   AST 13 0 - 37 U/L   ALT 8 0 - 35 U/L   Total Protein 6.7 6.0 - 8.3 g/dL   Albumin 4.2 3.5 - 5.2 g/dL   GFR 96.29 >52.84 mL/min   Calcium 8.5 8.4 - 10.5 mg/dL    Assessment and Plan  Intractable chronic migraine without aura and without status migrainosus Assessment & Plan: Chronic, acute worsening now with daily headache Possible daily medication overuse headache.  Encouraged her to stop using over-the-counter medication for her washout..  Work on stress reduction, get adequate sleep and increase hydration. We will add atenolol 25 mg p.o. nightly.  Hopefully we will be able to decrease and wean off her topiramate but as she is not having any side effects to this we will continue it until we know that symptoms are improving with atenolol.  Discussed using Imitrex with possible repeated dose if migraine not resolved. She will follow-up in 4-weeks for reevaluation.     Moderate major depression (HCC) Assessment & Plan: Chronic, inadequate control on Wellbutrin XL 300 mg p.o. daily.  She has been intolerant of multiple SSRIs and SNRI in the past. She would like to avoid any medication that could potentially cause weight gain given her father's history of morbid obesity.  Offered counseling, she will  consider. Mood may improve now that working a different schedule to allow her space when at home.   Other orders -     Atenolol; Take 1 tablet (25 mg total) by mouth daily.  Dispense: 30 tablet; Refill: 11 -     SUMAtriptan Succinate; Take 1 tablet (100 mg total) by mouth once for 1 dose. May repeat in 2 hours if headache persists or recurs.  Dispense: 10 tablet; Refill: 2    Return in about 4 weeks (around 05/09/2023) for  follow up  migraine.   Kerby Nora, MD

## 2023-04-11 NOTE — Patient Instructions (Addendum)
Stop all over the counter medications.  Push water intake.   Work on stress reduction. Call if interested in counseling.  Start atenolol 25 mg daily.  If severe migraine, early on.. take imitrex, if HA is not gone, repeat it in 2 hours.

## 2023-04-23 ENCOUNTER — Telehealth: Payer: Self-pay | Admitting: Family Medicine

## 2023-04-23 ENCOUNTER — Encounter: Payer: Self-pay | Admitting: Family Medicine

## 2023-04-23 ENCOUNTER — Ambulatory Visit: Payer: BC Managed Care – PPO | Admitting: Family Medicine

## 2023-04-23 VITALS — BP 100/64 | HR 76 | Temp 98.1°F | Ht 63.0 in | Wt 196.0 lb

## 2023-04-23 DIAGNOSIS — G43719 Chronic migraine without aura, intractable, without status migrainosus: Secondary | ICD-10-CM | POA: Diagnosis not present

## 2023-04-23 MED ORDER — QULIPTA 30 MG PO TABS: 30.0000 mg | ORAL_TABLET | Freq: Two times a day (BID) | ORAL | 0 refills | Status: DC

## 2023-04-23 NOTE — Telephone Encounter (Signed)
Okay to make change as requested to Qulipta 30 mg p.o. daily

## 2023-04-23 NOTE — Assessment & Plan Note (Addendum)
Chronic, side effects to recent trial of atenolol. Topamax maximum dose ineffective. In the past she has tried amitriptyline with side effects. Will try a trial of Qulipta 30 mg daily and titrate up to 30 mg twice daily. Referral in process to neurology/headache wellness center. FMLA paperwork already filled out for as needed migraine but she may need continuous sleeves given she has been taken out of work while trying a new medication by her work.  Return and ER precautions provided

## 2023-04-23 NOTE — Telephone Encounter (Signed)
Rachel from [employer] called asking if it'd be okay to change the instructions on Atogepant (QULIPTA) 30 MG TABS? [employer] states pt's insurance won't cover "one tablet by mouth 2 times daily" but they will cover "one tablet by mouth once daily". Call back # [phone]

## 2023-04-23 NOTE — Patient Instructions (Addendum)
Wean down off topiramate over the next week.  Start  Qulipta 30 mg daily for a few days then increase if tolerated to twice daily after 1 week.

## 2023-04-23 NOTE — Progress Notes (Signed)
Patient ID: Tiffany Donovan, female    DOB: 1985/12/15, 37 y.o.   MRN: [id number]  This visit was conducted in person.  BP 100/64   Pulse 76   Temp 98.1 F (36.7 C) (Temporal)   Ht 5\' 3"  (1.6 m)   Wt 196 lb (88.9 kg)   SpO2 98%   BMI 34.72 kg/m    CC:  Chief Complaint  Patient presents with   Medication Reaction    Atenolol has caused blurry vision.  Pt has stopped taking medication Sat.     Subjective:   HPI: Tiffany Donovan is a 37 y.o. female presenting on 04/23/2023 for Medication Reaction (Atenolol has caused blurry vision. /Pt has stopped taking medication Sat. )   Migraine  prophylaxis... she has noted new blurry vision since starting metoprolol.. started a week after starting.   Max dose topamax has been no longer helpful.  She has had some stress level decrease in last several weeks.  Has tried amyitriptiline in past.. caused SE.    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       Relevant past medical, surgical, family and social history reviewed and updated as indicated. Interim medical history since our last visit reviewed. Allergies and medications reviewed and updated. Outpatient Medications Prior to Visit  Medication Sig Dispense Refill   azelastine (OPTIVAR) 0.05 % ophthalmic solution Place 1 drop into both eyes 2 (two) times daily. 6 mL 12   buPROPion (WELLBUTRIN XL) 300 MG 24 hr tablet TAKE ONE TABLET BY MOUTH ONE TIME DAILY 30 tablet 3   ondansetron (ZOFRAN-ODT) 4 MG disintegrating tablet PLACE ONE TABLET ON THE TONGUE EVERY 8 HOURS AS NEEDED FOR NAUSEA AND VOMITING 10 tablet 1   pantoprazole (PROTONIX) 40 MG tablet Take 1 tablet (40 mg total) by mouth 2 (two) times daily. Please call [phone] to schedule an office visit for more refills. 180 tablet 0   topiramate (TOPAMAX) 50 MG tablet TAKE FOUR  TABLETS BY MOUTH ONE TIME DAILY 120 tablet 5   atenolol (TENORMIN) 25 MG tablet Take 1 tablet (25 mg total) by mouth daily. (Patient not taking: Reported on 04/23/2023) 30 tablet 11   SUMAtriptan (IMITREX) 100 MG tablet Take 1 tablet (100 mg total) by mouth once for 1 dose. May repeat in 2 hours if headache persists or recurs. 10 tablet 2   No facility-administered medications prior to visit.     Per HPI unless specifically indicated in ROS section below Review of Systems  Constitutional:  Negative for fatigue and fever.  HENT:  Negative for congestion.   Eyes:  Positive for visual disturbance. Negative for pain.  Respiratory:  Negative for cough and shortness of breath.   Cardiovascular:  Negative for chest pain, palpitations and leg swelling.  Gastrointestinal:  Negative for abdominal pain.  Genitourinary:  Negative for dysuria and vaginal bleeding.  Musculoskeletal:  Negative for back pain.  Neurological:  Positive for headaches. Negative for syncope and light-headedness.  Psychiatric/Behavioral:  Negative for dysphoric mood.    Objective:  BP 100/64   Pulse 76   Temp 98.1 F (36.7 C) (Temporal)   Ht 5\' 3"  (1.6 m)   Wt 196 lb (88.9 kg)   SpO2 98%   BMI 34.72 kg/m   Wt Readings from Last 3 Encounters:  04/23/23 196 lb (88.9 kg)  04/11/23 187 lb (84.8 kg)  01/03/23 195 lb (88.5 kg)      Physical Exam Constitutional:      General: She is not in acute distress.    Appearance: Normal appearance. She is well-developed. She is not ill-appearing or toxic-appearing.  HENT:     Head: Normocephalic.     Right Ear: Hearing, tympanic membrane, ear canal and external ear normal. Tympanic membrane is not erythematous, retracted or bulging.     Left Ear: Hearing, tympanic membrane, ear canal and external ear normal. Tympanic membrane is not erythematous, retracted or bulging.     Nose: No mucosal edema or rhinorrhea.     Right Sinus: No maxillary sinus tenderness or frontal sinus  tenderness.     Left Sinus: No maxillary sinus tenderness or frontal sinus tenderness.     Mouth/Throat:     Mouth: Oropharynx is clear and moist and mucous membranes are normal.     Pharynx: Uvula midline.  Eyes:     General: Lids are normal. Lids are everted, no foreign bodies appreciated.     Extraocular Movements: EOM normal.     Conjunctiva/sclera: Conjunctivae normal.     Pupils: Pupils are equal, round, and reactive to light.  Neck:     Thyroid: No thyroid mass or thyromegaly.     Vascular: No carotid bruit.     Trachea: Trachea normal.  Cardiovascular:     Rate and Rhythm: Normal rate and regular rhythm.     Pulses: Normal pulses.     Heart sounds: Normal heart sounds, S1 normal and S2 normal. No murmur heard.    No friction rub. No gallop.  Pulmonary:     Effort: Pulmonary effort is normal. No tachypnea or respiratory distress.     Breath sounds: Normal breath sounds. No decreased breath sounds, wheezing, rhonchi or rales.  Abdominal:     General: Bowel sounds are normal.     Palpations: Abdomen is soft.     Tenderness: There is no abdominal tenderness.  Musculoskeletal:     Cervical back: Normal range of motion and neck supple.  Skin:    General: Skin is warm, dry and intact.     Findings: No rash.  Neurological:     Mental Status: She is alert.  Psychiatric:        Mood and Affect: Mood is not anxious or depressed.        Speech: Speech normal.        Behavior: Behavior normal. Behavior is cooperative.        Thought Content: Thought content normal.        Cognition and Memory: Cognition and memory normal.        Judgment: Judgment normal.       Results for orders placed or performed in visit on 08/03/22  Lipid panel  Result Value Ref Range   Cholesterol 159 0 - 200 mg/dL   Triglycerides 78.2 0.0 - 149.0 mg/dL   HDL 95.62 >13.08 mg/dL   VLDL 6.0 0.0 - 65.7 mg/dL   LDL Cholesterol 88 0 - 99 mg/dL   Total CHOL/HDL Ratio 2    NonHDL 93.68   Comprehensive  metabolic panel  Result Value Ref Range   Sodium 139 135 - 145 mEq/L   Potassium 3.7 3.5 - 5.1 mEq/L   Chloride 108 96 - 112 mEq/L   CO2 24 19 - 32 mEq/L   Glucose, Bld 77 70 - 99 mg/dL  BUN 18 6 - 23 mg/dL   Creatinine, Ser 4.09 0.40 - 1.20 mg/dL   Total Bilirubin 0.5 0.2 - 1.2 mg/dL   Alkaline Phosphatase 38 (L) 39 - 117 U/L   AST 13 0 - 37 U/L   ALT 8 0 - 35 U/L   Total Protein 6.7 6.0 - 8.3 g/dL   Albumin 4.2 3.5 - 5.2 g/dL   GFR 81.19 >14.78 mL/min   Calcium 8.5 8.4 - 10.5 mg/dL    Assessment and Plan  There are no diagnoses linked to this encounter.  No follow-ups on file.   Kerby Nora, MD

## 2023-04-23 NOTE — Telephone Encounter (Signed)
Called and notify pharmacy of change.

## 2023-05-08 ENCOUNTER — Telehealth: Payer: Self-pay | Admitting: Family Medicine

## 2023-05-08 NOTE — Telephone Encounter (Signed)
Vertell Novak from [facility] came by office, giving his info regarding the PA process for pt's meds, Atogepant (QULIPTA) 30 MG TABS. Tawanna Cooler states he follows up with the pt to make sure the process goes smoothly for pt. Tawanna Cooler stated if anything is needed, feel free to contact him. Call back # [phone]

## 2023-05-16 ENCOUNTER — Other Ambulatory Visit (HOSPITAL_COMMUNITY): Payer: Self-pay

## 2023-05-16 NOTE — Telephone Encounter (Signed)
Per test claim, max of 1 tablet per day, please switch to 60mg  tablets daily, or provide letter of medical necessity explaining medical need for 30 mg twice daily. Please be aware, 60mg  Bennie Pierini will also require PA.

## 2023-05-16 NOTE — Telephone Encounter (Signed)
Medication already changed to Qulipta 30 mg  ONCE daily.

## 2023-05-16 NOTE — Telephone Encounter (Signed)
Med list shows 30 mg bid??

## 2023-05-17 MED ORDER — QULIPTA 30 MG PO TABS: 30.0000 mg | ORAL_TABLET | Freq: Every day | ORAL | 1 refills | Status: DC

## 2023-05-17 NOTE — Addendum Note (Signed)
Addended by: Kerby Nora E on: 05/17/2023 01:17 PM   Modules accepted: Orders

## 2023-05-17 NOTE — Telephone Encounter (Signed)
I know this was discussed earlier, on the day the medication was prescribed.  Maybe it was not sent to the pharmacy as it should have been.  I will resend at once a day.

## 2023-06-13 NOTE — Telephone Encounter (Signed)
Todd from [place] ,an access specialist called to check on PA status for medication Quilipta for this patient.He stated that she had a card that guarantees her 3 months of this medication,but after December she will not be able to get medication if PA Isn't approved.

## 2023-06-19 ENCOUNTER — Other Ambulatory Visit (HOSPITAL_COMMUNITY): Payer: Self-pay

## 2023-06-19 ENCOUNTER — Telehealth: Payer: Self-pay

## 2023-06-19 ENCOUNTER — Encounter: Payer: Self-pay | Admitting: *Deleted

## 2023-06-19 NOTE — Telephone Encounter (Signed)
*  Primary  Pharmacy Patient Advocate Encounter  Received notification from [facility] that Prior Authorization for Qulipta 30MG  tablets  has been APPROVED from 06/19/2023 to 12/17/2023. Ran test claim, Copay is $0.00. This test claim was processed through [facility]- copay amounts may vary at other pharmacies due to pharmacy/plan contracts, or as the patient moves through the different stages of their insurance plan.   PA #/Case ID/Reference #: [id number]

## 2023-07-05 ENCOUNTER — Other Ambulatory Visit (HOSPITAL_COMMUNITY): Payer: Self-pay

## 2023-07-05 ENCOUNTER — Other Ambulatory Visit: Payer: Self-pay | Admitting: Family Medicine

## 2023-07-05 ENCOUNTER — Other Ambulatory Visit: Payer: Self-pay | Admitting: Gastroenterology

## 2023-07-05 NOTE — Telephone Encounter (Signed)
Patient has been scheduled

## 2023-07-05 NOTE — Telephone Encounter (Signed)
PA request has been Approved. New Encounter created for follow up. For additional info see Pharmacy Prior Auth telephone encounter from 06/19/23.

## 2023-07-05 NOTE — Telephone Encounter (Signed)
Last office visit 04/23/2023 for Medication Reaction.  Last refilled 04/23/2023 for #10 with 1 refill.  Next Appt: No future appointments.   Please schedule CPE with fasting labs prior for after 08/04/2023.

## 2023-07-19 ENCOUNTER — Telehealth: Payer: Self-pay | Admitting: *Deleted

## 2023-07-19 DIAGNOSIS — Z13220 Encounter for screening for lipoid disorders: Secondary | ICD-10-CM

## 2023-07-19 NOTE — Telephone Encounter (Signed)
-----   Message from Vincenza Hews sent at 07/19/2023  3:21 PM EST ----- Regarding: Lab Wed 08/01/23 Hello,  Patient is coming in for CPE labs on Wednesday 08/01/23. Can we get orders please.   Thanks

## 2023-08-01 ENCOUNTER — Other Ambulatory Visit: Payer: BC Managed Care – PPO

## 2023-08-08 ENCOUNTER — Encounter: Payer: Self-pay | Admitting: Family Medicine

## 2023-08-08 ENCOUNTER — Ambulatory Visit (INDEPENDENT_AMBULATORY_CARE_PROVIDER_SITE_OTHER): Payer: BC Managed Care – PPO | Admitting: Family Medicine

## 2023-08-08 VITALS — BP 128/72 | HR 60 | Temp 97.6°F | Ht 62.8 in | Wt 204.8 lb

## 2023-08-08 DIAGNOSIS — Z13220 Encounter for screening for lipoid disorders: Secondary | ICD-10-CM | POA: Diagnosis not present

## 2023-08-08 DIAGNOSIS — E669 Obesity, unspecified: Secondary | ICD-10-CM

## 2023-08-08 DIAGNOSIS — Z0000 Encounter for general adult medical examination without abnormal findings: Secondary | ICD-10-CM

## 2023-08-08 DIAGNOSIS — F321 Major depressive disorder, single episode, moderate: Secondary | ICD-10-CM | POA: Diagnosis not present

## 2023-08-08 DIAGNOSIS — G43719 Chronic migraine without aura, intractable, without status migrainosus: Secondary | ICD-10-CM | POA: Diagnosis not present

## 2023-08-08 LAB — COMPREHENSIVE METABOLIC PANEL
ALT: 13 U/L (ref 0–35)
AST: 14 U/L (ref 0–37)
Albumin: 4.3 g/dL (ref 3.5–5.2)
Alkaline Phosphatase: 41 U/L (ref 39–117)
BUN: 18 mg/dL (ref 6–23)
CO2: 29 meq/L (ref 19–32)
Calcium: 9.2 mg/dL (ref 8.4–10.5)
Chloride: 104 meq/L (ref 96–112)
Creatinine, Ser: 0.72 mg/dL (ref 0.40–1.20)
GFR: 106.58 mL/min (ref >60.00)
Glucose, Bld: 83 mg/dL (ref 70–99)
Potassium: 4.4 meq/L (ref 3.5–5.1)
Sodium: 138 meq/L (ref 135–145)
Total Bilirubin: 0.5 mg/dL (ref 0.2–1.2)
Total Protein: 6.9 g/dL (ref 6.0–8.3)

## 2023-08-08 LAB — LIPID PANEL
Cholesterol: 181 mg/dL (ref 0–200)
HDL: 68.6 mg/dL (ref >39.00)
LDL Cholesterol: 103 mg/dL — ABNORMAL HIGH (ref 0–99)
NonHDL: 112.55
Total CHOL/HDL Ratio: 3
Triglycerides: 49 mg/dL (ref 0.0–149.0)
VLDL: 9.8 mg/dL (ref 0.0–40.0)

## 2023-08-08 NOTE — Assessment & Plan Note (Signed)
Chronic, inadequate control on Wellbutrin XL 300 mg p.o. daily.  She has been intolerant of multiple SSRIs and SNRI in the past. She would like to avoid any medication that could potentially cause weight gain given her father's history of morbid obesity.  Offered counseling, she will consider. Mood may improve now that working a different schedule to allow her space when at home.

## 2023-08-08 NOTE — Assessment & Plan Note (Addendum)
 Chronic, side effects to recent trial of atenolol . Topamax  maximum dose ineffective.  Trial of Qulipta  30 mg daily  inititally helpful but does not appear insurance will cover higher dose.  Referral in process to neurology/headache wellness center.   Return and ER precautions provided

## 2023-08-08 NOTE — Assessment & Plan Note (Signed)
 Encouraged exercise, weight loss, healthy eating habits. ? ?

## 2023-08-08 NOTE — Progress Notes (Signed)
 Patient ID: Tiffany Donovan, female    DOB: 04/10/1986, 38 y.o.   MRN: [id number]  This visit was conducted in person.  BP 128/72   Pulse 60   Temp 97.6 F (36.4 C) (Oral)   Ht 5' 2.8" (1.595 m)   Wt 204 lb 12.8 oz (92.9 kg)   SpO2 98%   BMI 36.52 kg/m    CC: Chief Complaint  Patient presents with   Annual Exam    Subjective:   HPI: Tiffany Donovan is a 38 y.o. female presenting on 08/08/2023 for Annual Exam   GAD, PMDD, MDD: Well controlled on current regimen Wellbutrin  SR 300 mg p.o. twice daily   Flowsheet Row Office Visit from 08/08/2023 in [facility]  PHQ-2 Total Score 0      Obesity  Wt Readings from Last 3 Encounters:  08/08/23 204 lb 12.8 oz (92.9 kg)  04/23/23 196 lb (88.9 kg)  04/11/23 187 lb (84.8 kg)     Diet:  moderate.. improvement after holidays Exercise:  walking daily.Aaron Aas acitve at work. Body mass index is 36.52 kg/m.  Wt Readings from Last 3 Encounters:  08/08/23 204 lb 12.8 oz (92.9 kg)  04/23/23 196 lb (88.9 kg)  04/11/23 187 lb (84.8 kg)    Migraine: Chronic,  on  Qulipta  30 mg daily   Migraine improved some, but now 2 times a week in last few weeks. Using B/C for acute, given SE to imitrex .     Relevant past medical, surgical, family and social history reviewed and updated as indicated. Interim medical history since our last visit reviewed. Allergies and medications reviewed and updated. Outpatient Medications Prior to Visit  Medication Sig Dispense Refill   Atogepant  (QULIPTA ) 30 MG TABS Take 1 tablet (30 mg total) by mouth daily. 30 tablet 1   azelastine  (OPTIVAR ) 0.05 % ophthalmic solution Place 1 drop into both eyes 2 (two) times daily. 6 mL 12   buPROPion  (WELLBUTRIN  XL) 300 MG 24 hr tablet TAKE ONE TABLET BY MOUTH ONE TIME DAILY 90 tablet 0   ondansetron  (ZOFRAN -ODT) 4 MG disintegrating tablet PLACE ONE TABLET ON THE TONGUE EVERY 8 HOURS AS NEEDED FOR NAUSEA AND VOMITING 10 tablet 1    pantoprazole  (PROTONIX ) 40 MG tablet TAKE ONE TABLET BY MOUTH TWICE A DAY - PLEASE CALL [phone] TO SCHEDULE AN OFFICE VISIT FOR MORE REFILLS 60 tablet 0   SUMAtriptan  (IMITREX ) 100 MG tablet Take 1 tablet (100 mg total) by mouth once for 1 dose. May repeat in 2 hours if headache persists or recurs. 10 tablet 2   atenolol  (TENORMIN ) 25 MG tablet Take 1 tablet (25 mg total) by mouth daily. (Patient not taking: Reported on 04/23/2023) 30 tablet 11   topiramate  (TOPAMAX ) 50 MG tablet TAKE FOUR TABLETS BY MOUTH ONE TIME DAILY 120 tablet 5   No facility-administered medications prior to visit.     Per HPI unless specifically indicated in ROS section below Review of Systems  Constitutional:  Negative for fatigue and fever.  HENT:  Negative for congestion.   Eyes:  Negative for pain.  Respiratory:  Negative for cough and shortness of breath.   Cardiovascular:  Negative for chest pain, palpitations and leg swelling.  Gastrointestinal:  Negative for abdominal pain.  Genitourinary:  Negative for dysuria and vaginal bleeding.  Musculoskeletal:  Negative for back pain.  Neurological:  Positive for headaches. Negative for syncope and light-headedness.  Psychiatric/Behavioral:  Negative for dysphoric mood.  Objective:  BP 128/72   Pulse 60   Temp 97.6 F (36.4 C) (Oral)   Ht 5' 2.8" (1.595 m)   Wt 204 lb 12.8 oz (92.9 kg)   SpO2 98%   BMI 36.52 kg/m   Wt Readings from Last 3 Encounters:  08/08/23 204 lb 12.8 oz (92.9 kg)  04/23/23 196 lb (88.9 kg)  04/11/23 187 lb (84.8 kg)      Physical Exam Constitutional:      General: She is not in acute distress.    Appearance: Normal appearance. She is well-developed. She is obese. She is not ill-appearing or toxic-appearing.  HENT:     Head: Normocephalic.     Right Ear: Hearing, tympanic membrane, ear canal and external ear normal.     Left Ear: Hearing, tympanic membrane, ear canal and external ear normal.     Nose: Nose normal.  Eyes:      General: Lids are normal. Lids are everted, no foreign bodies appreciated.     Conjunctiva/sclera: Conjunctivae normal.     Pupils: Pupils are equal, round, and reactive to light.  Neck:     Thyroid : No thyroid  mass or thyromegaly.     Vascular: No carotid bruit.     Trachea: Trachea normal.  Cardiovascular:     Rate and Rhythm: Normal rate and regular rhythm.     Heart sounds: Normal heart sounds, S1 normal and S2 normal. No murmur heard.    No gallop.  Pulmonary:     Effort: Pulmonary effort is normal. No respiratory distress.     Breath sounds: Normal breath sounds. No wheezing, rhonchi or rales.  Abdominal:     General: Bowel sounds are normal. There is no distension or abdominal bruit.     Palpations: Abdomen is soft. There is no fluid wave or mass.     Tenderness: There is no abdominal tenderness. There is no guarding or rebound.     Hernia: No hernia is present.  Musculoskeletal:     Cervical back: Normal range of motion and neck supple.  Lymphadenopathy:     Cervical: No cervical adenopathy.  Skin:    General: Skin is warm and dry.     Findings: No rash.  Neurological:     Mental Status: She is alert.     Cranial Nerves: No cranial nerve deficit.     Sensory: No sensory deficit.  Psychiatric:        Mood and Affect: Mood is not anxious or depressed.        Speech: Speech normal.        Behavior: Behavior normal. Behavior is cooperative.        Judgment: Judgment normal.       Results for orders placed or performed in visit on 08/03/22  Lipid panel   Collection Time: 08/03/22  3:04 PM  Result Value Ref Range   Cholesterol 159 0 - 200 mg/dL   Triglycerides 78.4 0.0 - 149.0 mg/dL   HDL 69.62 >95.28 mg/dL   VLDL 6.0 0.0 - 41.3 mg/dL   LDL Cholesterol 88 0 - 99 mg/dL   Total CHOL/HDL Ratio 2    NonHDL 93.68   Comprehensive metabolic panel   Collection Time: 08/03/22  3:04 PM  Result Value Ref Range   Sodium 139 135 - 145 mEq/L   Potassium 3.7 3.5 - 5.1  mEq/L   Chloride 108 96 - 112 mEq/L   CO2 24 19 - 32 mEq/L   Glucose, Bld 77  70 - 99 mg/dL   BUN 18 6 - 23 mg/dL   Creatinine, Ser 2.13 0.40 - 1.20 mg/dL   Total Bilirubin 0.5 0.2 - 1.2 mg/dL   Alkaline Phosphatase 38 (L) 39 - 117 U/L   AST 13 0 - 37 U/L   ALT 8 0 - 35 U/L   Total Protein 6.7 6.0 - 8.3 g/dL   Albumin 4.2 3.5 - 5.2 g/dL   GFR 08.65 >78.46 mL/min   Calcium 8.5 8.4 - 10.5 mg/dL    This visit occurred during the SARS-CoV-2 public health emergency.  Safety protocols were in place, including screening questions prior to the visit, additional usage of staff PPE, and extensive cleaning of exam room while observing appropriate contact time as indicated for disinfecting solutions.   COVID 19 screen:  No recent travel or known exposure to COVID19 The patient denies respiratory symptoms of COVID 19 at this time. The importance of social distancing was discussed today.   Assessment and Plan The patient's preventative maintenance and recommended screening tests for an annual wellness exam were reviewed in full today. Brought up to date unless services declined.  Counselled on the importance of diet, exercise, and its role in overall health and mortality. The patient's FH and SH was reviewed, including their home life, tobacco status, and drug and alcohol status.     Vaccines:  COVID x 4,  uptodate with td, refused flu Pap/DVE:  2023 ASCUS per Dr. Rogue Clear, repeat in 1 year... over due for re-eval. Mammo: no early family history of breast cancer ( PGF may have had breast cancer)..  Colon:  No early family history CRC Smoking Status: none ETOH/ drug use: occ/ none HIV screen:   done Hep C: done  Problem List Items Addressed This Visit     Intractable chronic migraine without aura and without status migrainosus   Chronic, side effects to recent trial of atenolol . Topamax  maximum dose ineffective.  Trial of Qulipta  30 mg daily  inititally helpful but does not appear  insurance will cover higher dose.  Referral in process to neurology/headache wellness center.   Return and ER precautions provided      Moderate major depression (HCC)   Chronic, inadequate control on Wellbutrin  XL 300 mg p.o. daily.  She has been intolerant of multiple SSRIs and SNRI in the past. She would like to avoid any medication that could potentially cause weight gain given her father's history of morbid obesity.  Offered counseling, she will consider. Mood may improve now that working a different schedule to allow her space when at home.      Obesity (BMI 35.0-39.9 without comorbidity)   Encouraged exercise, weight loss, healthy eating habits.       Other Visit Diagnoses       Routine general medical examination at a health care facility    -  Primary       Herby Lolling, MD

## 2023-08-08 NOTE — Patient Instructions (Addendum)
 [facility]  to set up appt: [phone]  Please stop at the lab to have labs drawn.  Call Dr. Adriana Hopping for follow up PAP smear

## 2023-09-18 ENCOUNTER — Other Ambulatory Visit: Payer: Self-pay | Admitting: Family Medicine

## 2023-10-04 ENCOUNTER — Ambulatory Visit: Admitting: Family Medicine

## 2023-10-08 ENCOUNTER — Other Ambulatory Visit: Payer: Self-pay

## 2023-10-08 ENCOUNTER — Ambulatory Visit: Payer: Self-pay | Admitting: Family Medicine

## 2023-10-08 ENCOUNTER — Emergency Department (HOSPITAL_COMMUNITY)
Admission: EM | Admit: 2023-10-08 | Discharge: 2023-10-08 | Disposition: A | Discharge status: Home / Self Care | Attending: Emergency Medicine | Admitting: Emergency Medicine

## 2023-10-08 DIAGNOSIS — R110 Nausea: Secondary | ICD-10-CM | POA: Diagnosis not present

## 2023-10-08 DIAGNOSIS — R1013 Epigastric pain: Secondary | ICD-10-CM | POA: Diagnosis present

## 2023-10-08 DIAGNOSIS — R197 Diarrhea, unspecified: Secondary | ICD-10-CM | POA: Insufficient documentation

## 2023-10-08 LAB — URINALYSIS, ROUTINE W REFLEX MICROSCOPIC
Bacteria, UA: NONE SEEN
Bilirubin Urine: NEGATIVE
Glucose, UA: NEGATIVE mg/dL
Ketones, ur: 20 mg/dL — AB
Leukocytes,Ua: NEGATIVE
Nitrite: NEGATIVE
Protein, ur: NEGATIVE mg/dL
Specific Gravity, Urine: 1.018 (ref 1.005–1.030)
pH: 5 (ref 5.0–8.0)

## 2023-10-08 LAB — COMPREHENSIVE METABOLIC PANEL
ALT: 16 U/L (ref 0–44)
AST: 17 U/L (ref 15–41)
Albumin: 3.8 g/dL (ref 3.5–5.0)
Alkaline Phosphatase: 42 U/L (ref 38–126)
Anion gap: 8 (ref 5–15)
BUN: 12 mg/dL (ref 6–20)
CO2: 25 mmol/L (ref 22–32)
Calcium: 8.4 mg/dL — ABNORMAL LOW (ref 8.9–10.3)
Chloride: 106 mmol/L (ref 98–111)
Creatinine, Ser: 0.67 mg/dL (ref 0.44–1.00)
GFR, Estimated: >60 mL/min (ref >60)
Glucose, Bld: 90 mg/dL (ref 70–99)
Potassium: 4 mmol/L (ref 3.5–5.1)
Sodium: 139 mmol/L (ref 135–145)
Total Bilirubin: 0.6 mg/dL (ref 0.0–1.2)
Total Protein: 7.1 g/dL (ref 6.5–8.1)

## 2023-10-08 LAB — CBC
HCT: 41.9 % (ref 36.0–46.0)
Hemoglobin: 13.9 g/dL (ref 12.0–15.0)
MCH: 30.5 pg (ref 26.0–34.0)
MCHC: 33.2 g/dL (ref 30.0–36.0)
MCV: 92.1 fL (ref 80.0–100.0)
Platelets: 281 10*3/uL (ref 150–400)
RBC: 4.55 MIL/uL (ref 3.87–5.11)
RDW: 12.3 % (ref 11.5–15.5)
WBC: 4.1 10*3/uL (ref 4.0–10.5)
nRBC: 0 % (ref 0.0–0.2)

## 2023-10-08 LAB — RESP PANEL BY RT-PCR (RSV, FLU A&B, COVID)  RVPGX2
Influenza A by PCR: NEGATIVE
Influenza B by PCR: NEGATIVE
Resp Syncytial Virus by PCR: NEGATIVE
SARS Coronavirus 2 by RT PCR: NEGATIVE

## 2023-10-08 LAB — HCG, SERUM, QUALITATIVE: Preg, Serum: NEGATIVE

## 2023-10-08 LAB — LIPASE, BLOOD: Lipase: 31 U/L (ref 11–51)

## 2023-10-08 LAB — TROPONIN I (HIGH SENSITIVITY): Troponin I (High Sensitivity): <2 ng/L (ref <18)

## 2023-10-08 MED ORDER — SODIUM CHLORIDE 0.9 % IV BOLUS
1000.0000 mL | Freq: Once | INTRAVENOUS | Status: AC
  Administered 2023-10-08: 1000 mL via INTRAVENOUS

## 2023-10-08 MED ORDER — ONDANSETRON 4 MG PO TBDP: 4.0000 mg | ORAL_TABLET | Freq: Three times a day (TID) | ORAL | 0 refills | Status: DC | PRN

## 2023-10-08 NOTE — Discharge Instructions (Addendum)
 I believe that your symptoms are likely secondary to a GI bug.  You should drink lots of fluids, and eat bland foods, to help with these.  Return to the ER if you develop with fever, severe abdominal pain, or unable to tolerate food or drink.  I prescribed you some nausea medicine, take this as needed.

## 2023-10-08 NOTE — Telephone Encounter (Signed)
 Chief Complaint: diarrhea Symptoms: abd pain, diarrhea, nausea, migraines, sweating, fever, weakness Frequency: yesterday AM Pertinent Negatives: Patient denies CP, SOB Disposition: [x] ED /[] Urgent Care (no appt availability in office) / [] Appointment(In office/virtual)/ []  [facility]/ [] Home Care/ [] Refused Recommended Disposition /[] [facility]/ []  Follow-up with PCP Additional Notes: Pt reports abd pain, diarrhea, fever, sweating, weakness, and migraines since yesterday AM. States diarrhea is frequent and very watery. Endorses 99.57F fever but a lot of sweating. 7/0 abdominal pain that was a 10/10 abd pain yesterday. Endorses nausea without vomiting. States drinking liquid makes the stomach pain worse so she is not drinking as much as she should. She is still working on sipping a small Gatorade that she began drinking at 4pm yesterday. Endorses dizziness and weakness especially with getting up. Hx of migraines, endorses a migraine as well. States the pain is throbbing. Per protocol RN advised pt to go to the ED for poss dehydration and she was agreeable, states she has a ride. RN advised pt if she develops worsening weakness, passing out, CP, or SOB before she arrrives to call 911 and she verbalized understanding.   Copied from CRM [id number]. Topic: Clinical - Red Word Triage >> Oct 08, 2023 10:04 AM Fonda Kinder J wrote: Red Word that prompted transfer to Nurse Triage: Fever,Migraines,Diarrhea Reason for Disposition  [1] Drinking very little AND [2] dehydration suspected (e.g., no urine > 12 hours, very dry mouth, very lightheaded)  Answer Assessment - Initial Assessment Questions 1. DIARRHEA SEVERITY: "How bad is the diarrhea?" "How many more stools have you had in the past 24 hours than normal?"    - NO DIARRHEA (SCALE 0)   - MILD (SCALE 1-3): Few loose or mushy BMs; increase of 1-3 stools over normal daily number of stools; mild increase in ostomy output.   -  MODERATE  (SCALE 4-7): Increase of 4-6 stools daily over normal; moderate increase in ostomy output.   -  SEVERE (SCALE 8-10; OR "WORST POSSIBLE"): Increase of 7 or more stools daily over normal; moderate increase in ostomy output; incontinence.     Moderate - took Immodium 2. ONSET: "When did the diarrhea begin?"      Yesterday AM 3. BM CONSISTENCY: "How loose or watery is the diarrhea?"      Mostly watery 4. VOMITING: "Are you also vomiting?" If Yes, ask: "How many times in the past 24 hours?"      No 5. ABDOMEN PAIN: "Are you having any abdomen pain?" If Yes, ask: "What does it feel like?" (e.g., crampy, dull, intermittent, constant)      Symptoms started with abd pain and nausea yesterday 6AM, stayed at the toilet for an hour, took Zofran 6. ABDOMEN PAIN SEVERITY: If present, ask: "How bad is the pain?"  (e.g., Scale 1-10; mild, moderate, or severe)   - MILD (1-3): doesn't interfere with normal activities, abdomen soft and not tender to touch    - MODERATE (4-7): interferes with normal activities or awakens from sleep, abdomen tender to touch    - SEVERE (8-10): excruciating pain, doubled over, unable to do any normal activities       "When I try to drink fluids it hurts so bad once it gets halfway down" "I don't know if it's acid reflux or what" - right now it's a 7/10, yesterday it was a 10 7. ORAL INTAKE: If vomiting, "Have you been able to drink liquids?" "How much liquids have you had in the past 24 hours?"     "  Been able to keep liquids down but can't drink too much, I have a small gatorade I got yesterday at 4PM, due to pain" "I am probably dehydrated" 8. HYDRATION: "Any signs of dehydration?" (e.g., dry mouth [not just dry lips], too weak to stand, dizziness, new weight loss) "When did you last urinate?"     Dizziness (esp with getting up), weakness, dry mouth 9. EXPOSURE: "Have you traveled to a foreign country recently?" "Have you been exposed to anyone with diarrhea?" "Could you have eaten  any food that was spoiled?"     No 10. ANTIBIOTIC USE: "Are you taking antibiotics now or have you taken antibiotics in the past 2 months?"       No 11. OTHER SYMPTOMS: "Do you have any other symptoms?" (e.g., fever, blood in stool)       99.25F fever, "in bed all day yesterday and right now", "I'm sweating", endorses migraines ("it feels like my head is throbbing, beating on my head, I have had to take Tylenol and this AM I took a BC but I am taking that too frequently"), hx of migraines "but it's weird because I feel like I have a fever, and when I get up to go to the bathroom I have to take a minute to stand up because my head hurts so bad", light sensitivity  Protocols used: [facility]

## 2023-10-08 NOTE — ED Triage Notes (Signed)
 Pt arrived via POV. C/o upper abd pain, nausea, and diarrhea since yesterday.  AOx4

## 2023-10-08 NOTE — Telephone Encounter (Signed)
 Noted.

## 2023-10-08 NOTE — ED Provider Notes (Signed)
 [place] EMERGENCY DEPARTMENT AT [facility] Provider Note   CSN: [id number] Arrival date & time: 10/08/23  1208     History  Chief Complaint  Patient presents with   Abdominal Pain   Diarrhea   Nausea    Tiffany Donovan is a 38 y.o. female, hx of diverticulitis, who presents to the ED 2/2 to headache, nausea, and abdominal pain, for the last day.  She states she has been feeling sore for the last day, and has had some upper abdominal pain, that was constant, but has now resolved.  She states that she has had 4-5 episodes of loose stools, but no vomiting, but does endorse some nausea.  Denies any chest pain, shortness of breath, runny nose, or sore throat or cough.  Denies any recent travel, or abnormal foods.  No blood in the stool.  Is able to tolerate p.o.  Home Medications Prior to Admission medications   Medication Sig Start Date End Date Taking? Authorizing Provider  ondansetron (ZOFRAN-ODT) 4 MG disintegrating tablet Take 1 tablet (4 mg total) by mouth every 8 (eight) hours as needed. 10/08/23  Yes Tashima Scarpulla L, PA  Atogepant (QULIPTA) 30 MG TABS TAKE ONE TABLET BY MOUTH ONE TIME DAILY 09/18/23   Bedsole, Amy E, MD  azelastine (OPTIVAR) 0.05 % ophthalmic solution Place 1 drop into both eyes 2 (two) times daily. 01/03/23   Bedsole, Amy E, MD  buPROPion (WELLBUTRIN XL) 300 MG 24 hr tablet TAKE ONE TABLET BY MOUTH ONE TIME DAILY 09/18/23   Bedsole, Amy E, MD  pantoprazole (PROTONIX) 40 MG tablet TAKE ONE TABLET BY MOUTH TWICE A DAY - PLEASE CALL [phone] TO SCHEDULE AN OFFICE VISIT FOR MORE REFILLS 07/05/23   Cirigliano, Vito V, DO  SUMAtriptan (IMITREX) 100 MG tablet Take 1 tablet (100 mg total) by mouth once for 1 dose. May repeat in 2 hours if headache persists or recurs. 04/11/23 03/24/24  Excell Seltzer, MD      Allergies    Patient has no known allergies.    Review of Systems   Review of Systems  Constitutional:  Negative for fever.  Respiratory:  Negative  for shortness of breath.   Gastrointestinal:  Positive for abdominal pain and diarrhea.    Physical Exam Updated Vital Signs BP 116/61 (BP Location: Right Arm)   Pulse 60   Temp 98.1 F (36.7 C) (Oral)   Resp 14   Ht 5\' 3"  (1.6 m)   Wt 95.3 kg   SpO2 99%   BMI 37.20 kg/m  Physical Exam Vitals and nursing note reviewed.  Constitutional:      General: She is not in acute distress.    Appearance: She is well-developed.  HENT:     Head: Normocephalic and atraumatic.     Mouth/Throat:     Mouth: Mucous membranes are moist.     Pharynx: Oropharynx is clear.     Tonsils: No tonsillar exudate.  Eyes:     Conjunctiva/sclera: Conjunctivae normal.  Cardiovascular:     Rate and Rhythm: Normal rate and regular rhythm.     Heart sounds: No murmur heard. Pulmonary:     Effort: Pulmonary effort is normal. No respiratory distress.     Breath sounds: Normal breath sounds.  Abdominal:     Palpations: Abdomen is soft.     Tenderness: There is no abdominal tenderness.  Musculoskeletal:        General: No swelling.     Cervical back: Neck  supple.  Skin:    General: Skin is warm and dry.     Capillary Refill: Capillary refill takes less than 2 seconds.  Neurological:     Mental Status: She is alert.  Psychiatric:        Mood and Affect: Mood normal.     ED Results / Procedures / Treatments   Labs (all labs ordered are listed, but only abnormal results are displayed) Labs Reviewed  COMPREHENSIVE METABOLIC PANEL - Abnormal; Notable for the following components:      Result Value   Calcium 8.4 (*)    All other components within normal limits  URINALYSIS, ROUTINE W REFLEX MICROSCOPIC - Abnormal; Notable for the following components:   Hgb urine dipstick MODERATE (*)    Ketones, ur 20 (*)    All other components within normal limits  RESP PANEL BY RT-PCR (RSV, FLU A&B, COVID)  RVPGX2  LIPASE, BLOOD  CBC  HCG, SERUM, QUALITATIVE  TROPONIN I (HIGH SENSITIVITY)     EKG None  Radiology No results found.  Procedures Procedures    Medications Ordered in ED Medications  sodium chloride 0.9 % bolus 1,000 mL (1,000 mLs Intravenous New Bag/Given 10/08/23 1814)    ED Course/ Medical Decision Making/ A&P                                 Medical Decision Making Patient is a 38 year old female, here for epigastric pain, cramping, as well as nausea and diarrhea this been going on for the last day.  Abdominal pain has mostly resolved, but she has nausea, and diarrhea.  The diarrhea is nonbloody.  She is overall well-appearing, abdomen is nontender.  Given her headache, and just generalized fatigue will obtain COVID flu for further evaluation, as well as blood work and provide her with fluids.  Amount and/or Complexity of Data Reviewed Labs: ordered.    Details: Blood work unremarkable, COVID flu within normal limits Discussion of management or test interpretation with external provider(s): Patient is well-appearing, 38 year old female, with a nontender abdomen, that is overall well-appearing.  She has moist mucosal membranes.  Labs are within normal limits, COVID flu within normal limits.  Feeling better after fluids, I believe that her symptoms are likely secondary to a GI bug.  We informed her, that she will need to drink lots of fluids, and rest.  Gust return precautions and she voiced understanding.  Risk Prescription drug management.    Final Clinical Impression(s) / ED Diagnoses Final diagnoses:  Epigastric pain  Diarrhea, unspecified type    Rx / DC Orders ED Discharge Orders          Ordered    ondansetron (ZOFRAN-ODT) 4 MG disintegrating tablet  Every 8 hours PRN        10/08/23 1934              Kobey Sides, Harley Alto, PA 10/08/23 1936    Royanne Foots, DO 10/08/23 2347

## 2023-10-09 ENCOUNTER — Ambulatory Visit: Admitting: Family Medicine

## 2023-10-09 ENCOUNTER — Encounter: Payer: Self-pay | Admitting: Family Medicine

## 2023-10-09 VITALS — BP 90/60 | HR 76 | Temp 98.1°F | Ht 62.8 in | Wt 213.1 lb

## 2023-10-09 DIAGNOSIS — R1013 Epigastric pain: Secondary | ICD-10-CM

## 2023-10-09 DIAGNOSIS — G43719 Chronic migraine without aura, intractable, without status migrainosus: Secondary | ICD-10-CM | POA: Diagnosis not present

## 2023-10-09 MED ORDER — TOPIRAMATE 50 MG PO TABS
ORAL_TABLET | ORAL | 1 refills | Status: DC
Start: 2023-10-09 — End: 2023-12-27

## 2023-10-09 NOTE — Progress Notes (Signed)
 Patient ID: Tiffany Donovan, female    DOB: 01/31/1986, 38 y.o.   MRN: [id number]  This visit was conducted in person.  BP 90/60 (BP Location: Left Arm, Patient Position: Sitting, Cuff Size: Large)   Pulse 76   Temp 98.1 F (36.7 C) (Temporal)   Ht 5' 2.8" (1.595 m)   Wt 213 lb 2 oz (96.7 kg)   LMP 10/07/2023   SpO2 98%   BMI 37.99 kg/m    CC:  Chief Complaint  Patient presents with   Migraine   Form Completion    FMLA   Follow-up    Seen in ER yesterday Epigastric Pain/Diarrhea    Subjective:   HPI: Tiffany Donovan is a 38 y.o. female presenting on 10/09/2023 for Migraine, Form Completion (FMLA), and Follow-up (Seen in ER yesterday Epigastric Pain/Diarrhea)   She presents for chronic migraine follow-up.  She needs her FMLA form completed for work leave as needed migraines. Migraines ongoing  2 times a week.  Using [facility] more frequently. She is using Imitrex as needed. She is on Wellbutrin 300 mg daily for mood. She is no longer on Qulipta.  She never set up appt with headache wellness center.  She feels that topiramate was helping when she was on it.   She was also seen yesterday in the ED for epigastric pain.  She had 4-5 episodes of loose stools but no vomiting.  Workup included negative pregnancy test, normal lipase, troponin, EKG, urinalysis, low calcium Negative COVID flu and RSV  Today she reports  cold chills and hot flashes, still diarrhea and nausea  No further abd pain.  Drinking lots of liquids.  No sick contacts.     Relevant past medical, surgical, family and social history reviewed and updated as indicated. Interim medical history since our last visit reviewed. Allergies and medications reviewed and updated. Outpatient Medications Prior to Visit  Medication Sig Dispense Refill   azelastine (OPTIVAR) 0.05 % ophthalmic solution Place 1 drop into both eyes 2 (two) times daily as needed.     pantoprazole (PROTONIX) 40 MG tablet Take 40 mg by mouth 2  (two) times daily as needed.     buPROPion (WELLBUTRIN XL) 300 MG 24 hr tablet TAKE ONE TABLET BY MOUTH ONE TIME DAILY 90 tablet 1   ondansetron (ZOFRAN-ODT) 4 MG disintegrating tablet Take 1 tablet (4 mg total) by mouth every 8 (eight) hours as needed. 20 tablet 0   SUMAtriptan (IMITREX) 100 MG tablet Take 1 tablet (100 mg total) by mouth once for 1 dose. May repeat in 2 hours if headache persists or recurs. 10 tablet 2   Atogepant (QULIPTA) 30 MG TABS TAKE ONE TABLET BY MOUTH ONE TIME DAILY 30 tablet 2   azelastine (OPTIVAR) 0.05 % ophthalmic solution Place 1 drop into both eyes 2 (two) times daily. (Patient taking differently: Place 1 drop into both eyes 2 (two) times daily as needed.) 6 mL 12   pantoprazole (PROTONIX) 40 MG tablet TAKE ONE TABLET BY MOUTH TWICE A DAY - PLEASE CALL [phone] TO SCHEDULE AN OFFICE VISIT FOR MORE REFILLS 60 tablet 0   No facility-administered medications prior to visit.     Per HPI unless specifically indicated in ROS section below Review of Systems  Constitutional:  Positive for chills. Negative for fatigue and fever.  HENT:  Negative for congestion.   Eyes:  Negative for pain.  Respiratory:  Negative for cough and shortness of breath.   Cardiovascular:  Negative  for chest pain, palpitations and leg swelling.  Gastrointestinal:  Positive for abdominal pain, diarrhea and nausea.  Genitourinary:  Negative for dysuria and vaginal bleeding.  Musculoskeletal:  Negative for back pain.  Neurological:  Negative for syncope, light-headedness and headaches.  Psychiatric/Behavioral:  Negative for dysphoric mood.    Objective:  BP 90/60 (BP Location: Left Arm, Patient Position: Sitting, Cuff Size: Large)   Pulse 76   Temp 98.1 F (36.7 C) (Temporal)   Ht 5' 2.8" (1.595 m)   Wt 213 lb 2 oz (96.7 kg)   LMP 10/07/2023   SpO2 98%   BMI 37.99 kg/m   Wt Readings from Last 3 Encounters:  10/09/23 213 lb 2 oz (96.7 kg)  10/08/23 210 lb (95.3 kg)  08/08/23 204  lb 12.8 oz (92.9 kg)      Physical Exam Constitutional:      General: She is not in acute distress.    Appearance: Normal appearance. She is well-developed. She is not ill-appearing or toxic-appearing.  HENT:     Head: Normocephalic.     Right Ear: Hearing, tympanic membrane, ear canal and external ear normal. Tympanic membrane is not erythematous, retracted or bulging.     Left Ear: Hearing, tympanic membrane, ear canal and external ear normal. Tympanic membrane is not erythematous, retracted or bulging.     Nose: No mucosal edema or rhinorrhea.     Right Sinus: No maxillary sinus tenderness or frontal sinus tenderness.     Left Sinus: No maxillary sinus tenderness or frontal sinus tenderness.     Mouth/Throat:     Pharynx: Uvula midline.  Eyes:     General: Lids are normal. Lids are everted, no foreign bodies appreciated.     Conjunctiva/sclera: Conjunctivae normal.     Pupils: Pupils are equal, round, and reactive to light.  Neck:     Thyroid: No thyroid mass or thyromegaly.     Vascular: No carotid bruit.     Trachea: Trachea normal.  Cardiovascular:     Rate and Rhythm: Normal rate and regular rhythm.     Pulses: Normal pulses.     Heart sounds: Normal heart sounds, S1 normal and S2 normal. No murmur heard.    No friction rub. No gallop.  Pulmonary:     Effort: Pulmonary effort is normal. No tachypnea or respiratory distress.     Breath sounds: Normal breath sounds. No decreased breath sounds, wheezing, rhonchi or rales.  Abdominal:     General: Bowel sounds are normal.     Palpations: Abdomen is soft.     Tenderness: There is abdominal tenderness in the epigastric area.  Musculoskeletal:     Cervical back: Normal range of motion and neck supple.  Skin:    General: Skin is warm and dry.     Findings: No rash.  Neurological:     Mental Status: She is alert.  Psychiatric:        Mood and Affect: Mood is not anxious or depressed.        Speech: Speech normal.         Behavior: Behavior normal. Behavior is cooperative.        Thought Content: Thought content normal.        Judgment: Judgment normal.       Results for orders placed or performed during the hospital encounter of 10/08/23  Lipase, blood   Collection Time: 10/08/23 12:43 PM  Result Value Ref Range   Lipase 31 11 - 51  U/L  Comprehensive metabolic panel   Collection Time: 10/08/23 12:43 PM  Result Value Ref Range   Sodium 139 135 - 145 mmol/L   Potassium 4.0 3.5 - 5.1 mmol/L   Chloride 106 98 - 111 mmol/L   CO2 25 22 - 32 mmol/L   Glucose, Bld 90 70 - 99 mg/dL   BUN 12 6 - 20 mg/dL   Creatinine, Ser 7.82 0.44 - 1.00 mg/dL   Calcium 8.4 (L) 8.9 - 10.3 mg/dL   Total Protein 7.1 6.5 - 8.1 g/dL   Albumin 3.8 3.5 - 5.0 g/dL   AST 17 15 - 41 U/L   ALT 16 0 - 44 U/L   Alkaline Phosphatase 42 38 - 126 U/L   Total Bilirubin 0.6 0.0 - 1.2 mg/dL   GFR, Estimated >95 >62 mL/min   Anion gap 8 5 - 15  CBC   Collection Time: 10/08/23 12:43 PM  Result Value Ref Range   WBC 4.1 4.0 - 10.5 K/uL   RBC 4.55 3.87 - 5.11 MIL/uL   Hemoglobin 13.9 12.0 - 15.0 g/dL   HCT 13.0 86.5 - 78.4 %   MCV 92.1 80.0 - 100.0 fL   MCH 30.5 26.0 - 34.0 pg   MCHC 33.2 30.0 - 36.0 g/dL   RDW 69.6 29.5 - 28.4 %   Platelets 281 150 - 400 K/uL   nRBC 0.0 0.0 - 0.2 %  hCG, serum, qualitative   Collection Time: 10/08/23 12:43 PM  Result Value Ref Range   Preg, Serum NEGATIVE NEGATIVE  Troponin I (High Sensitivity)   Collection Time: 10/08/23  5:32 PM  Result Value Ref Range   Troponin I (High Sensitivity) <2 <18 ng/L  Urinalysis, Routine w reflex microscopic -Urine, Clean Catch   Collection Time: 10/08/23  5:41 PM  Result Value Ref Range   Color, Urine YELLOW YELLOW   APPearance CLEAR CLEAR   Specific Gravity, Urine 1.018 1.005 - 1.030   pH 5.0 5.0 - 8.0   Glucose, UA NEGATIVE NEGATIVE mg/dL   Hgb urine dipstick MODERATE (A) NEGATIVE   Bilirubin Urine NEGATIVE NEGATIVE   Ketones, ur 20 (A) NEGATIVE mg/dL    Protein, ur NEGATIVE NEGATIVE mg/dL   Nitrite NEGATIVE NEGATIVE   Leukocytes,Ua NEGATIVE NEGATIVE   RBC / HPF 0-5 0 - 5 RBC/hpf   WBC, UA 0-5 0 - 5 WBC/hpf   Bacteria, UA NONE SEEN NONE SEEN   Squamous Epithelial / HPF 0-5 0 - 5 /HPF   Mucus PRESENT    Hyaline Casts, UA PRESENT   Resp panel by RT-PCR (RSV, Flu A&B, Covid) Urine, Clean Catch   Collection Time: 10/08/23  5:42 PM   Specimen: Urine, Clean Catch; Nasal Swab  Result Value Ref Range   SARS Coronavirus 2 by RT PCR NEGATIVE NEGATIVE   Influenza A by PCR NEGATIVE NEGATIVE   Influenza B by PCR NEGATIVE NEGATIVE   Resp Syncytial Virus by PCR NEGATIVE NEGATIVE    Assessment and Plan  Epigastric pain Assessment & Plan: Acute, reviewed ED evaluation.  Workup included negative pregnancy test, normal lipase, troponin, EKG urinalysis. Symptoms are most likely secondary to viral gastroenteritis.  If her symptoms are not improving as expected with rest fluids and time she will follow-up.   Intractable chronic migraine without aura and without status migrainosus Assessment & Plan: Chronic,  side effects to trial of atenolol. Topamax maximum dose ineffective.  Trial of Qulipta 30 mg daily  inititally helpful but does not appear insurance  will cover higher dose.  Referral in process to neurology/headache wellness center.   Return and ER precautions provided   Other orders -     Topiramate; Start 50 mg at bedtime, if tolerated can increase ggraduallyto 3 at bedtime.  Dispense: 90 tablet; Refill: 1    No follow-ups on file.   Kerby Nora, MD

## 2023-10-09 NOTE — Assessment & Plan Note (Signed)
 Chronic,  side effects to trial of atenolol. Topamax maximum dose ineffective.  Trial of Qulipta 30 mg daily  inititally helpful but does not appear insurance will cover higher dose.  Referral in process to neurology/headache wellness center.   Return and ER precautions provided

## 2023-10-25 NOTE — Assessment & Plan Note (Signed)
 Acute, reviewed ED evaluation.  Workup included negative pregnancy test, normal lipase, troponin, EKG urinalysis. Symptoms are most likely secondary to viral gastroenteritis.  If her symptoms are not improving as expected with rest fluids and time she will follow-up.

## 2023-10-30 ENCOUNTER — Other Ambulatory Visit: Payer: Self-pay | Admitting: Obstetrics and Gynecology

## 2023-10-30 DIAGNOSIS — N6320 Unspecified lump in the left breast, unspecified quadrant: Secondary | ICD-10-CM

## 2023-11-14 ENCOUNTER — Other Ambulatory Visit

## 2023-11-14 ENCOUNTER — Encounter

## 2023-11-21 ENCOUNTER — Other Ambulatory Visit (HOSPITAL_COMMUNITY): Payer: Self-pay

## 2023-11-21 ENCOUNTER — Telehealth: Payer: Self-pay

## 2023-11-21 NOTE — Telephone Encounter (Signed)
 Pharmacy Patient Advocate Encounter   Received notification from CoverMyMeds that prior authorization for Qulipta  30MG  tablets is required/requested.   Insurance verification completed.   The patient is insured through [facility] [employer] .   Per test claim: PA required; PA submitted to above mentioned insurance via CoverMyMeds Key/confirmation #/EOC [id number] Status is pending

## 2023-11-21 NOTE — Telephone Encounter (Signed)
 Pharmacy Patient Advocate Encounter  Received notification from [employer] that Prior Authorization for Qulipta  30MG  tablets has been APPROVED from 11/21/23 to 11/20/24. Ran test claim, Copay is $0. This test claim was processed through [facility]- copay amounts may vary at other pharmacies due to pharmacy/plan contracts, or as the patient moves through the different stages of their insurance plan.   PA #/Case ID/Reference #: [id number]

## 2023-11-29 ENCOUNTER — Other Ambulatory Visit

## 2023-11-29 ENCOUNTER — Encounter

## 2023-12-20 ENCOUNTER — Other Ambulatory Visit: Payer: Self-pay | Admitting: Obstetrics and Gynecology

## 2023-12-20 ENCOUNTER — Ambulatory Visit
Admission: RE | Admit: 2023-12-20 | Discharge: 2023-12-20 | Disposition: A | Discharge status: Home / Self Care | Source: Ambulatory Visit | Attending: Obstetrics and Gynecology | Admitting: Obstetrics and Gynecology

## 2023-12-20 DIAGNOSIS — N6310 Unspecified lump in the right breast, unspecified quadrant: Secondary | ICD-10-CM

## 2023-12-20 DIAGNOSIS — N6320 Unspecified lump in the left breast, unspecified quadrant: Secondary | ICD-10-CM

## 2023-12-20 DIAGNOSIS — N6489 Other specified disorders of breast: Secondary | ICD-10-CM

## 2023-12-27 ENCOUNTER — Other Ambulatory Visit: Payer: Self-pay | Admitting: Family Medicine

## 2023-12-27 ENCOUNTER — Ambulatory Visit
Admission: RE | Admit: 2023-12-27 | Discharge: 2023-12-27 | Disposition: A | Discharge status: Home / Self Care | Source: Ambulatory Visit | Attending: Obstetrics and Gynecology | Admitting: Obstetrics and Gynecology

## 2023-12-27 ENCOUNTER — Other Ambulatory Visit: Payer: Self-pay | Admitting: General Practice

## 2023-12-27 DIAGNOSIS — N6489 Other specified disorders of breast: Secondary | ICD-10-CM

## 2023-12-27 HISTORY — PX: BREAST BIOPSY: SHX20

## 2023-12-27 NOTE — Telephone Encounter (Signed)
 Last office visit 10/09/2023 for Epigastric Pain and Migraines.  Last refilled 10/09/2023 for #90 with 1 refill.  I see Qulipta  30 mg PA was approved but I don't see it on her medication list.  I wasn't sure if patient is suppose to be taking both?  Please advise.

## 2023-12-28 LAB — SURGICAL PATHOLOGY

## 2024-02-26 NOTE — H&P (Signed)
 Tiffany Donovan is an 38 y.o. female. She has known fibroids. U/S in office 11/16/23 notes fibroids of 5.9 cm, 3.6 cm, and 1.3 cm. Menses are very heavy and painful taking her off of her feet every month. A trial of an IUD gave no relief.  Pertinent Gynecological History: Menses: flow is excessive with use of many pads or tampons on heaviest days Bleeding:  Contraception: none DES exposure: denies Blood transfusions: none Sexually transmitted diseases: no past history Previous GYN Procedures:   Last mammogram:  Date:  Last pap: abnormal: HR HPV positive (16/18/45 negative) Date: 10/30/23 OB History: G4, P2   Menstrual History: Menarche age:  No LMP recorded.    Past Medical History:  Diagnosis Date   Anxiety    Depression    Diverticulitis    Diverticulosis    Low back pain    Migraine headache    Obesity    Premenstrual dysphoric disorder     Past Surgical History:  Procedure Laterality Date   BREAST BIOPSY Right 12/27/2023   MM RT BREAST BX W LOC DEV 1ST LESION IMAGE BX SPEC STEREO GUIDE 12/27/2023 [facility] MAM[facility]   None      Family History  Problem Relation Age of Onset   Depression Mother    Diabetes Father    Kidney disease Father    Hypertension Father    Clotting disorder Father    Migraines Sister    Irritable bowel syndrome Sister    Migraines Sister    Diverticulosis Sister    Irritable bowel syndrome Sister    Depression Brother    Coronary artery disease Paternal Grandmother    Breast cancer Paternal Grandmother    Clotting disorder Paternal Grandmother    Coronary artery disease Paternal Grandfather    Colon cancer Paternal Grandfather    Cystic fibrosis Cousin        on mom side   Esophageal cancer Neg Hx    Stomach cancer Neg Hx    Rectal cancer Neg Hx     Social History:  reports that she has never smoked. She has never used smokeless tobacco. She reports current alcohol use. She reports that she does not use drugs.  Allergies: No Known  Allergies  No medications prior to admission.    Review of Systems  Constitutional:  Negative for fever.    There were no vitals taken for this visit. Physical Exam Cardiovascular:     Rate and Rhythm: Normal rate.  Pulmonary:     Effort: Pulmonary effort is normal.     No results found for this or any previous visit (from the past 24 hours).  No results found.  Assessment/Plan: 38 yo G4P2 with fibroids LAVH/BS-risks reviewed including infection, organ damage, bleeding/transfusion-HIV/Hep, DVT/PE, pneumonia, laparotomy.  Tiffany Donovan Tiffany Donovan II 02/26/2024, 6:23 PM

## 2024-02-27 NOTE — Pre-Procedure Instructions (Signed)
 Surgical Instructions   Your procedure is scheduled on March 06, 2024. Report to [facility] Main Entrance A at 5:30 A.M., then check in with the Admitting office. Any questions or running late day of surgery: call [phone]  Questions prior to your surgery date: call [phone], Monday-Friday, 8am-4pm. If you experience any cold or flu symptoms such as cough, fever, chills, shortness of breath, etc. between now and your scheduled surgery, please notify us  at the above number.     Remember:  Do not eat or drink after midnight the night before your surgery    Take these medicines the morning of surgery with A SIP OF WATER: NONE   STOP taking your TIRZEPATIDE one week prior to surgery. DO NOT take any doses after August 6th.   One week prior to surgery, STOP taking any Aspirin (unless otherwise instructed by your surgeon) Aleve, Naproxen, Ibuprofen, Motrin, Advil, Goody's, BC's, all herbal medications, fish oil, and non-prescription vitamins.                     Do NOT Smoke (Tobacco/Vaping) for 24 hours prior to your procedure.  If you use a CPAP at night, you may bring your mask/headgear for your overnight stay.   You will be asked to remove any contacts, glasses, piercing's, hearing aid's, dentures/partials prior to surgery. Please bring cases for these items if needed.    Patients discharged the day of surgery will not be allowed to drive home, and someone needs to stay with them for 24 hours.  SURGICAL WAITING ROOM VISITATION Patients may have no more than 2 support people in the waiting area - these visitors may rotate.   Pre-op nurse will coordinate an appropriate time for 1 ADULT support person, who may not rotate, to accompany patient in pre-op.  Children under the age of 74 must have an adult with them who is not the patient and must remain in the main waiting area with an adult.  If the patient needs to stay at the hospital during part of their recovery, the  visitor guidelines for inpatient rooms apply.  Please refer to the [facility] website for the visitor guidelines for any additional information.   If you received a COVID test during your pre-op visit  it is requested that you wear a mask when out in public, stay away from anyone that may not be feeling well and notify your surgeon if you develop symptoms. If you have been in contact with anyone that has tested positive in the last 10 days please notify you surgeon.      Pre-operative CHG Bathing Instructions   You can play a key role in reducing the risk of infection after surgery. Your skin needs to be as free of germs as possible. You can reduce the number of germs on your skin by washing with CHG (chlorhexidine  gluconate) soap before surgery. CHG is an antiseptic soap that kills germs and continues to kill germs even after washing.   DO NOT use if you have an allergy to chlorhexidine /CHG or antibacterial soaps. If your skin becomes reddened or irritated, stop using the CHG and notify one of our RNs at [phone].              TAKE A SHOWER THE NIGHT BEFORE SURGERY AND THE DAY OF SURGERY    Please keep in mind the following:  DO NOT shave, including legs and underarms, 48 hours prior to surgery.   You may shave  your face before/day of surgery.  Place clean sheets on your bed the night before surgery Use a clean washcloth (not used since being washed) for each shower. DO NOT sleep with pet's night before surgery.  CHG Shower Instructions:  Wash your face and private area with normal soap. If you choose to wash your hair, wash first with your normal shampoo.  After you use shampoo/soap, rinse your hair and body thoroughly to remove shampoo/soap residue.  Turn the water OFF and apply half the bottle of CHG soap to a CLEAN washcloth.  Apply CHG soap ONLY FROM YOUR NECK DOWN TO YOUR TOES (washing for 3-5 minutes)  DO NOT use CHG soap on face, private areas, open wounds, or sores.  Pay  special attention to the area where your surgery is being performed.  If you are having back surgery, having someone wash your back for you may be helpful. Wait 2 minutes after CHG soap is applied, then you may rinse off the CHG soap.  Pat dry with a clean towel  Put on clean pajamas    Additional instructions for the day of surgery: DO NOT APPLY any lotions, deodorants, cologne, or perfumes.   Do not wear jewelry or makeup Do not wear nail polish, gel polish, artificial nails, or any other type of covering on natural nails (fingers and toes) Do not bring valuables to the hospital. [facility] is not responsible for valuables/personal belongings. Put on clean/comfortable clothes.  Please brush your teeth.  Ask your nurse before applying any prescription medications to the skin.

## 2024-02-28 ENCOUNTER — Encounter (HOSPITAL_COMMUNITY)
Admission: RE | Admit: 2024-02-28 | Discharge: 2024-02-28 | Disposition: A | Discharge status: Home / Self Care | Source: Ambulatory Visit | Attending: Obstetrics and Gynecology | Admitting: Obstetrics and Gynecology

## 2024-02-28 ENCOUNTER — Encounter (HOSPITAL_COMMUNITY): Payer: Self-pay

## 2024-02-28 ENCOUNTER — Other Ambulatory Visit: Payer: Self-pay

## 2024-02-28 VITALS — BP 114/72 | HR 76 | Temp 98.0°F | Resp 17 | Ht 63.0 in | Wt 195.0 lb

## 2024-02-28 DIAGNOSIS — Z01812 Encounter for preprocedural laboratory examination: Secondary | ICD-10-CM | POA: Diagnosis present

## 2024-02-28 DIAGNOSIS — D219 Benign neoplasm of connective and other soft tissue, unspecified: Secondary | ICD-10-CM | POA: Diagnosis not present

## 2024-02-28 DIAGNOSIS — Z01818 Encounter for other preprocedural examination: Secondary | ICD-10-CM

## 2024-02-28 LAB — TYPE AND SCREEN
ABO/RH(D): O POS
Antibody Screen: NEGATIVE

## 2024-02-28 LAB — CBC
HCT: 39.1 % (ref 36.0–46.0)
Hemoglobin: 13.6 g/dL (ref 12.0–15.0)
MCH: 31.6 pg (ref 26.0–34.0)
MCHC: 34.8 g/dL (ref 30.0–36.0)
MCV: 90.7 fL (ref 80.0–100.0)
Platelets: 244 K/uL (ref 150–400)
RBC: 4.31 MIL/uL (ref 3.87–5.11)
RDW: 12.5 % (ref 11.5–15.5)
WBC: 4 K/uL (ref 4.0–10.5)
nRBC: 0 % (ref 0.0–0.2)

## 2024-02-28 NOTE — Progress Notes (Signed)
 PCP - Dr. Greig Ring Cardiologist - denies  PPM/ICD - denies   Chest x-ray - 04/04/19 EKG - 10/08/23 Stress Test - denies ECHO - denies Cardiac Cath - denies  Sleep Study - denies   DM- denies  Last dose of GLP1 agonist-  02/27/24 GLP1 instructions: Pt knows to hold until after surgery  ASA/Blood Thinner Instructions: n/a   ERAS Protcol - no, NPO   COVID TEST- n/a   Anesthesia review: no  Patient denies shortness of breath, fever, cough and chest pain at PAT appointment   All instructions explained to the patient, with a verbal understanding of the material. Patient agrees to go over the instructions while at home for a better understanding. The opportunity to ask questions was provided.

## 2024-03-05 DIAGNOSIS — Z01818 Encounter for other preprocedural examination: Secondary | ICD-10-CM

## 2024-03-06 ENCOUNTER — Ambulatory Visit (HOSPITAL_COMMUNITY): Admitting: Anesthesiology

## 2024-03-06 ENCOUNTER — Other Ambulatory Visit: Payer: Self-pay

## 2024-03-06 ENCOUNTER — Encounter (HOSPITAL_COMMUNITY)
Admission: AD | Disposition: A | Discharge status: Home / Self Care | Payer: Self-pay | Source: Home / Self Care | Attending: Obstetrics and Gynecology

## 2024-03-06 ENCOUNTER — Encounter (HOSPITAL_COMMUNITY): Payer: Self-pay | Admitting: Obstetrics and Gynecology

## 2024-03-06 ENCOUNTER — Inpatient Hospital Stay (HOSPITAL_COMMUNITY)
Admission: AD | Admit: 2024-03-06 | Discharge: 2024-03-10 | DRG: 743 | Disposition: A | Discharge status: Home / Self Care | Attending: Obstetrics and Gynecology | Admitting: Obstetrics and Gynecology

## 2024-03-06 DIAGNOSIS — Z8249 Family history of ischemic heart disease and other diseases of the circulatory system: Secondary | ICD-10-CM

## 2024-03-06 DIAGNOSIS — D252 Subserosal leiomyoma of uterus: Principal | ICD-10-CM | POA: Diagnosis present

## 2024-03-06 DIAGNOSIS — D219 Benign neoplasm of connective and other soft tissue, unspecified: Principal | ICD-10-CM | POA: Diagnosis present

## 2024-03-06 DIAGNOSIS — Z79899 Other long term (current) drug therapy: Secondary | ICD-10-CM

## 2024-03-06 DIAGNOSIS — Z90710 Acquired absence of both cervix and uterus: Secondary | ICD-10-CM

## 2024-03-06 DIAGNOSIS — Z833 Family history of diabetes mellitus: Secondary | ICD-10-CM

## 2024-03-06 DIAGNOSIS — Z01818 Encounter for other preprocedural examination: Secondary | ICD-10-CM

## 2024-03-06 DIAGNOSIS — I959 Hypotension, unspecified: Secondary | ICD-10-CM | POA: Diagnosis not present

## 2024-03-06 DIAGNOSIS — R102 Pelvic and perineal pain: Secondary | ICD-10-CM | POA: Diagnosis present

## 2024-03-06 DIAGNOSIS — E8809 Other disorders of plasma-protein metabolism, not elsewhere classified: Secondary | ICD-10-CM | POA: Diagnosis present

## 2024-03-06 HISTORY — PX: LAPAROSCOPIC VAGINAL HYSTERECTOMY WITH SALPINGECTOMY: SHX6680

## 2024-03-06 LAB — CBC
HCT: 34.2 % — ABNORMAL LOW (ref 36.0–46.0)
Hemoglobin: 11.8 g/dL — ABNORMAL LOW (ref 12.0–15.0)
MCH: 31.3 pg (ref 26.0–34.0)
MCHC: 34.5 g/dL (ref 30.0–36.0)
MCV: 90.7 fL (ref 80.0–100.0)
Platelets: 222 K/uL (ref 150–400)
RBC: 3.77 MIL/uL — ABNORMAL LOW (ref 3.87–5.11)
RDW: 12.6 % (ref 11.5–15.5)
WBC: 8.4 K/uL (ref 4.0–10.5)
nRBC: 0 % (ref 0.0–0.2)

## 2024-03-06 LAB — POCT PREGNANCY, URINE: Preg Test, Ur: NEGATIVE

## 2024-03-06 LAB — ABO/RH: ABO/RH(D): O POS

## 2024-03-06 SURGERY — HYSTERECTOMY, VAGINAL, LAPAROSCOPY-ASSISTED, WITH SALPINGECTOMY
Anesthesia: General | Laterality: Bilateral

## 2024-03-06 MED ORDER — GABAPENTIN 300 MG PO CAPS
300.0000 mg | ORAL_CAPSULE | ORAL | Status: AC
  Administered 2024-03-06: 300 mg via ORAL
  Filled 2024-03-06: qty 1

## 2024-03-06 MED ORDER — MEPERIDINE HCL 25 MG/ML IJ SOLN: 6.2500 mg | INTRAMUSCULAR | Status: DC | PRN

## 2024-03-06 MED ORDER — ONDANSETRON HCL 4 MG/2ML IJ SOLN: 4.0000 mg | Freq: Once | INTRAMUSCULAR | Status: DC | PRN

## 2024-03-06 MED ORDER — LIDOCAINE 2% (20 MG/ML) 5 ML SYRINGE
INTRAMUSCULAR | Status: DC | PRN
  Administered 2024-03-06: 60 mg via INTRAVENOUS

## 2024-03-06 MED ORDER — SUGAMMADEX SODIUM 200 MG/2ML IV SOLN
INTRAVENOUS | Status: DC | PRN
  Administered 2024-03-06: 200 mg via INTRAVENOUS

## 2024-03-06 MED ORDER — TRANEXAMIC ACID-NACL 1000-0.7 MG/100ML-% IV SOLN
1000.0000 mg | Freq: Once | INTRAVENOUS | Status: AC
  Administered 2024-03-06: 1000 mg via INTRAVENOUS
  Filled 2024-03-06: qty 100

## 2024-03-06 MED ORDER — HYDROMORPHONE HCL 1 MG/ML IJ SOLN
0.2000 mg | INTRAMUSCULAR | Status: DC | PRN
  Administered 2024-03-06: 0.4 mg via INTRAVENOUS
  Filled 2024-03-06: qty 1

## 2024-03-06 MED ORDER — CHLORHEXIDINE GLUCONATE 0.12 % MT SOLN
15.0000 mL | Freq: Once | OROMUCOSAL | Status: AC
  Administered 2024-03-06: 15 mL via OROMUCOSAL
  Filled 2024-03-06: qty 15

## 2024-03-06 MED ORDER — ACETAMINOPHEN 500 MG PO TABS
1000.0000 mg | ORAL_TABLET | ORAL | Status: AC
  Administered 2024-03-06: 1000 mg via ORAL
  Filled 2024-03-06: qty 2

## 2024-03-06 MED ORDER — HYDROMORPHONE HCL 1 MG/ML IJ SOLN
0.2500 mg | INTRAMUSCULAR | Status: DC | PRN
  Administered 2024-03-06 (×4): 0.5 mg via INTRAVENOUS

## 2024-03-06 MED ORDER — HYDROMORPHONE HCL 1 MG/ML IJ SOLN
INTRAMUSCULAR | Status: DC | PRN
  Administered 2024-03-06 (×2): .25 mg via INTRAVENOUS

## 2024-03-06 MED ORDER — PROPOFOL 10 MG/ML IV BOLUS
INTRAVENOUS | Status: AC
  Filled 2024-03-06: qty 20

## 2024-03-06 MED ORDER — OXYCODONE HCL 5 MG/5ML PO SOLN: 5.0000 mg | Freq: Once | ORAL | Status: AC | PRN

## 2024-03-06 MED ORDER — HYDROMORPHONE HCL 1 MG/ML IJ SOLN
INTRAMUSCULAR | Status: AC
  Filled 2024-03-06: qty 1

## 2024-03-06 MED ORDER — HYDROMORPHONE HCL 1 MG/ML IJ SOLN
INTRAMUSCULAR | Status: AC
  Filled 2024-03-06: qty 0.5

## 2024-03-06 MED ORDER — KETOROLAC TROMETHAMINE 30 MG/ML IJ SOLN
INTRAMUSCULAR | Status: DC | PRN
  Administered 2024-03-06: 30 mg via INTRAVENOUS

## 2024-03-06 MED ORDER — MIDAZOLAM HCL 2 MG/2ML IJ SOLN
INTRAMUSCULAR | Status: AC
  Filled 2024-03-06: qty 2

## 2024-03-06 MED ORDER — KETOROLAC TROMETHAMINE 30 MG/ML IJ SOLN
30.0000 mg | Freq: Four times a day (QID) | INTRAMUSCULAR | Status: AC
  Administered 2024-03-06 (×2): 30 mg via INTRAVENOUS
  Filled 2024-03-06 (×2): qty 1

## 2024-03-06 MED ORDER — OXYCODONE HCL 5 MG PO TABS
ORAL_TABLET | ORAL | Status: AC
  Filled 2024-03-06: qty 1

## 2024-03-06 MED ORDER — TOPIRAMATE 25 MG PO TABS
150.0000 mg | ORAL_TABLET | Freq: Every day | ORAL | Status: DC
  Administered 2024-03-06 – 2024-03-09 (×4): 150 mg via ORAL
  Filled 2024-03-06 (×7): qty 2

## 2024-03-06 MED ORDER — KETAMINE HCL 50 MG/5ML IJ SOSY
PREFILLED_SYRINGE | INTRAMUSCULAR | Status: DC | PRN
  Administered 2024-03-06: 20 mg via INTRAVENOUS
  Administered 2024-03-06: 10 mg via INTRAVENOUS

## 2024-03-06 MED ORDER — PROPOFOL 10 MG/ML IV BOLUS
INTRAVENOUS | Status: DC | PRN
  Administered 2024-03-06: 200 mg via INTRAVENOUS

## 2024-03-06 MED ORDER — KETOROLAC TROMETHAMINE 30 MG/ML IJ SOLN
INTRAMUSCULAR | Status: AC
  Filled 2024-03-06: qty 1

## 2024-03-06 MED ORDER — LACTATED RINGERS IV SOLN: INTRAVENOUS | Status: AC

## 2024-03-06 MED ORDER — DEXMEDETOMIDINE HCL IN NACL 80 MCG/20ML IV SOLN
INTRAVENOUS | Status: AC
  Filled 2024-03-06: qty 20

## 2024-03-06 MED ORDER — DEXAMETHASONE SODIUM PHOSPHATE 10 MG/ML IJ SOLN
INTRAMUSCULAR | Status: DC | PRN
  Administered 2024-03-06: 10 mg via INTRAVENOUS

## 2024-03-06 MED ORDER — ACETAMINOPHEN 500 MG PO TABS
1000.0000 mg | ORAL_TABLET | Freq: Four times a day (QID) | ORAL | Status: DC
  Administered 2024-03-06 – 2024-03-10 (×14): 1000 mg via ORAL
  Filled 2024-03-06 (×15): qty 2

## 2024-03-06 MED ORDER — DEXMEDETOMIDINE HCL IN NACL 80 MCG/20ML IV SOLN
INTRAVENOUS | Status: DC | PRN
  Administered 2024-03-06: 8 ug via INTRAVENOUS

## 2024-03-06 MED ORDER — KETOROLAC TROMETHAMINE 30 MG/ML IJ SOLN: 30.0000 mg | Freq: Once | INTRAMUSCULAR | Status: DC | PRN

## 2024-03-06 MED ORDER — FENTANYL CITRATE (PF) 250 MCG/5ML IJ SOLN
INTRAMUSCULAR | Status: DC | PRN
  Administered 2024-03-06 (×3): 50 ug via INTRAVENOUS
  Administered 2024-03-06: 100 ug via INTRAVENOUS

## 2024-03-06 MED ORDER — SCOPOLAMINE 1 MG/3DAYS TD PT72
1.0000 | MEDICATED_PATCH | TRANSDERMAL | Status: DC
  Administered 2024-03-06: 1.5 mg via TRANSDERMAL
  Filled 2024-03-06: qty 1

## 2024-03-06 MED ORDER — LACTATED RINGERS IV SOLN: INTRAVENOUS | Status: DC

## 2024-03-06 MED ORDER — SOD CITRATE-CITRIC ACID 500-334 MG/5ML PO SOLN
30.0000 mL | ORAL | Status: AC
  Administered 2024-03-06: 30 mL via ORAL
  Filled 2024-03-06: qty 30

## 2024-03-06 MED ORDER — ACETAMINOPHEN 500 MG PO TABS
1000.0000 mg | ORAL_TABLET | Freq: Once | ORAL | Status: DC
Start: 2024-03-06 — End: 2024-03-06

## 2024-03-06 MED ORDER — OXYCODONE HCL 5 MG PO TABS
5.0000 mg | ORAL_TABLET | ORAL | Status: DC | PRN
  Administered 2024-03-06 (×2): 5 mg via ORAL
  Administered 2024-03-07 (×3): 10 mg via ORAL
  Filled 2024-03-06: qty 2
  Filled 2024-03-06 (×2): qty 1
  Filled 2024-03-06 (×2): qty 2

## 2024-03-06 MED ORDER — KETAMINE HCL 50 MG/5ML IJ SOSY
PREFILLED_SYRINGE | INTRAMUSCULAR | Status: AC
Start: 2024-03-06 — End: 2024-03-06
  Filled 2024-03-06: qty 5

## 2024-03-06 MED ORDER — LIDOCAINE 2% (20 MG/ML) 5 ML SYRINGE
INTRAMUSCULAR | Status: AC
  Filled 2024-03-06: qty 5

## 2024-03-06 MED ORDER — DEXAMETHASONE SODIUM PHOSPHATE 10 MG/ML IJ SOLN
INTRAMUSCULAR | Status: AC
Start: 2024-03-06 — End: 2024-03-06
  Filled 2024-03-06: qty 1

## 2024-03-06 MED ORDER — ROCURONIUM BROMIDE 10 MG/ML (PF) SYRINGE
PREFILLED_SYRINGE | INTRAVENOUS | Status: AC
  Filled 2024-03-06: qty 10

## 2024-03-06 MED ORDER — ONDANSETRON HCL 4 MG PO TABS
4.0000 mg | ORAL_TABLET | Freq: Four times a day (QID) | ORAL | Status: DC | PRN
  Administered 2024-03-08 – 2024-03-10 (×3): 4 mg via ORAL
  Filled 2024-03-06 (×3): qty 1

## 2024-03-06 MED ORDER — ONDANSETRON HCL 4 MG/2ML IJ SOLN
INTRAMUSCULAR | Status: DC | PRN
  Administered 2024-03-06: 4 mg via INTRAVENOUS

## 2024-03-06 MED ORDER — BUPIVACAINE HCL (PF) 0.5 % IJ SOLN
INTRAMUSCULAR | Status: AC
Start: 2024-03-06 — End: 2024-03-06
  Filled 2024-03-06: qty 30

## 2024-03-06 MED ORDER — SENNA 8.6 MG PO TABS
1.0000 | ORAL_TABLET | Freq: Two times a day (BID) | ORAL | Status: DC
  Administered 2024-03-06 – 2024-03-09 (×7): 8.6 mg via ORAL
  Filled 2024-03-06 (×8): qty 1

## 2024-03-06 MED ORDER — ONDANSETRON HCL 4 MG/2ML IJ SOLN
INTRAMUSCULAR | Status: AC
  Filled 2024-03-06: qty 2

## 2024-03-06 MED ORDER — ORAL CARE MOUTH RINSE: 15.0000 mL | Freq: Once | OROMUCOSAL | Status: AC

## 2024-03-06 MED ORDER — LACTATED RINGERS IV BOLUS
500.0000 mL | Freq: Once | INTRAVENOUS | Status: AC
  Administered 2024-03-06: 500 mL via INTRAVENOUS

## 2024-03-06 MED ORDER — METHOCARBAMOL 1000 MG/10ML IJ SOLN
500.0000 mg | Freq: Four times a day (QID) | INTRAMUSCULAR | Status: DC | PRN
  Administered 2024-03-06: 500 mg via INTRAVENOUS
  Filled 2024-03-06 (×3): qty 5

## 2024-03-06 MED ORDER — LACTATED RINGERS IV BOLUS
250.0000 mL | Freq: Once | INTRAVENOUS | Status: AC
  Administered 2024-03-06: 250 mL via INTRAVENOUS

## 2024-03-06 MED ORDER — POVIDONE-IODINE 10 % EX SWAB
2.0000 | Freq: Once | CUTANEOUS | Status: AC
  Administered 2024-03-06: 2 via TOPICAL

## 2024-03-06 MED ORDER — BUPIVACAINE HCL (PF) 0.5 % IJ SOLN
INTRAMUSCULAR | Status: DC | PRN
  Administered 2024-03-06: 10 mL

## 2024-03-06 MED ORDER — BUPROPION HCL ER (XL) 300 MG PO TB24
300.0000 mg | ORAL_TABLET | Freq: Every day | ORAL | Status: DC
  Filled 2024-03-06: qty 1

## 2024-03-06 MED ORDER — PHENYLEPHRINE 80 MCG/ML (10ML) SYRINGE FOR IV PUSH (FOR BLOOD PRESSURE SUPPORT)
PREFILLED_SYRINGE | INTRAVENOUS | Status: DC | PRN
  Administered 2024-03-06 (×2): 80 ug via INTRAVENOUS

## 2024-03-06 MED ORDER — FENTANYL CITRATE (PF) 250 MCG/5ML IJ SOLN
INTRAMUSCULAR | Status: AC
  Filled 2024-03-06: qty 5

## 2024-03-06 MED ORDER — OXYCODONE HCL 5 MG PO TABS
5.0000 mg | ORAL_TABLET | Freq: Once | ORAL | Status: AC | PRN
  Administered 2024-03-06: 5 mg via ORAL

## 2024-03-06 MED ORDER — PHENYLEPHRINE 80 MCG/ML (10ML) SYRINGE FOR IV PUSH (FOR BLOOD PRESSURE SUPPORT)
PREFILLED_SYRINGE | INTRAVENOUS | Status: AC
  Filled 2024-03-06: qty 10

## 2024-03-06 MED ORDER — ROCURONIUM BROMIDE 10 MG/ML (PF) SYRINGE
PREFILLED_SYRINGE | INTRAVENOUS | Status: DC | PRN
  Administered 2024-03-06 (×2): 10 mg via INTRAVENOUS
  Administered 2024-03-06: 80 mg via INTRAVENOUS
  Administered 2024-03-06: 10 mg via INTRAVENOUS

## 2024-03-06 MED ORDER — IBUPROFEN 600 MG PO TABS
600.0000 mg | ORAL_TABLET | Freq: Four times a day (QID) | ORAL | Status: DC | PRN
  Administered 2024-03-07 – 2024-03-10 (×7): 600 mg via ORAL
  Filled 2024-03-06 (×7): qty 1

## 2024-03-06 MED ORDER — MIDAZOLAM HCL 2 MG/2ML IJ SOLN
INTRAMUSCULAR | Status: DC | PRN
  Administered 2024-03-06: 2 mg via INTRAVENOUS

## 2024-03-06 MED ORDER — ONDANSETRON HCL 4 MG/2ML IJ SOLN
4.0000 mg | Freq: Four times a day (QID) | INTRAMUSCULAR | Status: DC | PRN
  Administered 2024-03-08: 4 mg via INTRAVENOUS
  Filled 2024-03-06: qty 2

## 2024-03-06 MED ORDER — ALUM & MAG HYDROXIDE-SIMETH 200-200-20 MG/5ML PO SUSP
30.0000 mL | ORAL | Status: DC | PRN
Start: 2024-03-06 — End: 2024-03-10
  Filled 2024-03-06: qty 30

## 2024-03-06 MED ORDER — CEFAZOLIN SODIUM-DEXTROSE 2-4 GM/100ML-% IV SOLN
2.0000 g | INTRAVENOUS | Status: AC
  Administered 2024-03-06: 2 g via INTRAVENOUS
  Filled 2024-03-06: qty 100

## 2024-03-06 MED ORDER — AMISULPRIDE (ANTIEMETIC) 5 MG/2ML IV SOLN: 10.0000 mg | Freq: Once | INTRAVENOUS | Status: DC | PRN

## 2024-03-06 SURGICAL SUPPLY — 36 items
APPLICATOR ARISTA FLEXITIP XL (MISCELLANEOUS) IMPLANT
CATH ROBINSON RED A/P 16FR (CATHETERS) ×2 IMPLANT
CHLORAPREP W/TINT 26 (MISCELLANEOUS) ×2 IMPLANT
COVER BACK TABLE 60X90IN (DRAPES) ×2 IMPLANT
COVER MAYO STAND STRL (DRAPES) ×2 IMPLANT
DERMABOND ADVANCED .7 DNX12 (GAUZE/BANDAGES/DRESSINGS) ×2 IMPLANT
DRAPE SURG IRRIG POUCH 19X23 (DRAPES) ×2 IMPLANT
DRSG OPSITE POSTOP 3X4 (GAUZE/BANDAGES/DRESSINGS) ×2 IMPLANT
ELECTRODE REM PT RTRN 9FT ADLT (ELECTROSURGICAL) ×2 IMPLANT
GLOVE BIO SURGEON STRL SZ8 (GLOVE) ×6 IMPLANT
GLOVE SURG UNDER POLY LF SZ7 (GLOVE) ×4 IMPLANT
GOWN STRL REUS W/ TWL XL LVL3 (GOWN DISPOSABLE) ×6 IMPLANT
HEMOSTAT ARISTA ABSORB 3G PWDR (HEMOSTASIS) IMPLANT
IRRIGATION SUCT STRKRFLW 2 WTP (MISCELLANEOUS) IMPLANT
KIT PINK PAD W/HEAD ARM REST (MISCELLANEOUS) ×2 IMPLANT
KIT TURNOVER KIT B (KITS) ×2 IMPLANT
LIGASURE IMPACT 36 18CM CVD LR (INSTRUMENTS) ×2 IMPLANT
NDL INSUFFLATION 14GA 120MM (NEEDLE) ×2 IMPLANT
NEEDLE INSUFFLATION 14GA 120MM (NEEDLE) ×1 IMPLANT
NS IRRIG 1000ML POUR BTL (IV SOLUTION) ×2 IMPLANT
PACK LAVH (CUSTOM PROCEDURE TRAY) ×2 IMPLANT
SEALER TISSUE X1 CVD 45CM (MISCELLANEOUS) ×2 IMPLANT
SET TUBE SMOKE EVAC HIGH FLOW (TUBING) ×2 IMPLANT
SPECIMEN JAR MEDIUM (MISCELLANEOUS) ×2 IMPLANT
SPIKE FLUID TRANSFER (MISCELLANEOUS) ×2 IMPLANT
SUT MNCRL 0 MO-4 VIOLET 18 CR (SUTURE) ×4 IMPLANT
SUT VIC AB 2-0 SH 27X BRD (SUTURE) IMPLANT
SUT VIC AB 4-0 PS2 18 (SUTURE) ×2 IMPLANT
SUT VICRYL 0 UR6 27IN ABS (SUTURE) ×2 IMPLANT
SYR 30ML LL (SYRINGE) ×2 IMPLANT
TOWEL GREEN STERILE FF (TOWEL DISPOSABLE) ×4 IMPLANT
TRAY FOLEY W/BAG SLVR 14FR (SET/KITS/TRAYS/PACK) ×2 IMPLANT
TROCAR 11X100 Z THREAD (TROCAR) ×2 IMPLANT
TROCAR Z-THREAD FIOS 5X100MM (TROCAR) ×2 IMPLANT
UNDERPAD 30X36 HEAVY ABSORB (UNDERPADS AND DIAPERS) ×2 IMPLANT
WARMER LAPAROSCOPE (MISCELLANEOUS) ×2 IMPLANT

## 2024-03-06 NOTE — Progress Notes (Signed)
 No changes to H&P per patient history  Reviewed procedure-laparoscopically assisted vaginal hysterectomy with bilateral salpingectomy NKDA All questions answered. She states she understands and agrees

## 2024-03-06 NOTE — Progress Notes (Signed)
 On feet for orthostatic VS>no dizziness Tolerating juice and crackers. No N/V Has RLQ pain mostly with movement. She is comfortable when still.  No shoulder pain  Today's Vitals   03/06/24 1817 03/06/24 1819 03/06/24 1820 03/06/24 1824  BP: (!) 98/32 105/61 (!) 119/95 (!) 95/57  Pulse: (!) 108 67 79 83  Resp:      Temp:      TempSrc:      SpO2:      Weight:      Height:      PainSc:       Body mass index is 34.19 kg/m.  UO- >600 ml of dilute, clear urine in last hour  Smiling, NAD When up for orthostatics and getting into bed she is moving well, without dizziness, good color in her face.  Abdomen-BS good x 4, soft, NT  Results for orders placed or performed during the hospital encounter of 03/06/24 (from the past 24 hours)  Pregnancy, urine POC     Status: None   Collection Time: 03/06/24  6:25 AM  Result Value Ref Range   Preg Test, Ur NEGATIVE NEGATIVE  ABO/Rh     Status: None   Collection Time: 03/06/24  7:10 AM  Result Value Ref Range   ABO/RH(D)      O POS Performed at [facility], [address], [place], [place] [address]   CBC     Status: Abnormal   Collection Time: 03/06/24  2:46 PM  Result Value Ref Range   WBC 8.4 4.0 - 10.5 K/uL   RBC 3.77 (L) 3.87 - 5.11 MIL/uL   Hemoglobin 11.8 (L) 12.0 - 15.0 g/dL   HCT 65.7 (L) 63.9 - 53.9 %   MCV 90.7 80.0 - 100.0 fL   MCH 31.3 26.0 - 34.0 pg   MCHC 34.5 30.0 - 36.0 g/dL   RDW 87.3 88.4 - 84.4 %   Platelets 222 150 - 400 K/uL   nRBC 0.0 0.0 - 0.2 %    A/P: PO         Initially BPs 90s/49-50s. Now back to basline. She received IV fluids 500 then 250 ml bolus. Excellent UO and hgb is C/W recent surgery. Abdominal exam is reassuring.          Will add methocarbamal to pain medications.          D/W patient above.

## 2024-03-06 NOTE — Progress Notes (Signed)
 03/06/2024  9:39 AM  PATIENT:  Tiffany Donovan  38 y.o. female  PRE-OPERATIVE DIAGNOSIS:  FIBROIDS  PELVIC PAIN  POST-OPERATIVE DIAGNOSIS:  FIBROIDS PELVIC PAIN  PROCEDURE:  Procedure(s): HYSTERECTOMY, VAGINAL, LAPAROSCOPY-ASSISTED, WITH SALPINGECTOMY (Bilateral)  SURGEON:  Surgeons and Role:    * Curlene Agent, MD - Primary  PHYSICIAN ASSISTANT: Truman Corona MD  ASSISTANTS:    ANESTHESIA:   general  EBL:  150 mL   BLOOD ADMINISTERED:none  DRAINS: Urinary Catheter (Foley)   LOCAL MEDICATIONS USED:  MARCAINE     and Amount: 9 ml  SPECIMEN:  Source of Specimen:  uterus, bilateral tubes  DISPOSITION OF SPECIMEN:  PATHOLOGY  COUNTS:  YES  TOURNIQUET:  * No tourniquets in log *  DICTATION: .Other Dictation: Dictation Number [id number]  PLAN OF CARE: Admit for overnight observation  PATIENT DISPOSITION:  PACU - hemodynamically stable.   Delay start of Pharmacological VTE agent (>24hrs) due to surgical blood loss or risk of bleeding: not applicable

## 2024-03-06 NOTE — Transfer of Care (Signed)
 Immediate Anesthesia Transfer of Care Note  Patient: Tiffany Donovan  Procedure(s) Performed: HYSTERECTOMY, VAGINAL, LAPAROSCOPY-ASSISTED, WITH SALPINGECTOMY (Bilateral)  Patient Location: PACU  Anesthesia Type:General  Level of Consciousness: awake, alert , and oriented  Airway & Oxygen Therapy: Patient Spontanous Breathing and Patient connected to face mask oxygen  Post-op Assessment: Report given to RN and Post -op Vital signs reviewed and stable  Post vital signs: Reviewed and stable  Last Vitals:  Vitals Value Taken Time  BP 94/47 03/06/24 09:52  Temp    Pulse 73 03/06/24 09:56  Resp 15 03/06/24 09:56  SpO2 97 % 03/06/24 09:56  Vitals shown include unfiled device data.  Last Pain:  Vitals:   03/06/24 0616  TempSrc:   PainSc: 5       Patients Stated Pain Goal: 4 (03/06/24 9383)  Complications: No notable events documented.

## 2024-03-06 NOTE — Anesthesia Postprocedure Evaluation (Signed)
 Anesthesia Post Note  Patient: Tiffany Donovan  Procedure(s) Performed: HYSTERECTOMY, VAGINAL, LAPAROSCOPY-ASSISTED, WITH SALPINGECTOMY (Bilateral)     Patient location during evaluation: PACU Anesthesia Type: General Level of consciousness: awake and alert, oriented and patient cooperative Pain management: pain level controlled Vital Signs Assessment: post-procedure vital signs reviewed and stable Respiratory status: spontaneous breathing, nonlabored ventilation and respiratory function stable Cardiovascular status: blood pressure returned to baseline and stable Postop Assessment: no apparent nausea or vomiting Anesthetic complications: no   No notable events documented.  Last Vitals:  Vitals:   03/06/24 1221 03/06/24 1252  BP: (!) 91/53 (!) 100/59  Pulse: (!) 58 66  Resp: 11   Temp: 36.4 C (!) 36.4 C  SpO2: 100% 100%                 Almarie CHRISTELLA Marchi

## 2024-03-06 NOTE — Anesthesia Procedure Notes (Signed)
 Procedure Name: Intubation Date/Time: 03/06/2024 7:34 AM  Performed by: Vera Rochele PARAS, CRNAPre-anesthesia Checklist: Patient identified, Emergency Drugs available, Suction available and Patient being monitored Patient Re-evaluated:Patient Re-evaluated prior to induction Oxygen Delivery Method: Circle System Utilized Preoxygenation: Pre-oxygenation with 100% oxygen Induction Type: IV induction Ventilation: Mask ventilation without difficulty Laryngoscope Size: Miller and 3 Grade View: Grade I Tube type: Oral Tube size: 7.0 mm Number of attempts: 1 Airway Equipment and Method: Stylet and Oral airway Placement Confirmation: ETT inserted through vocal cords under direct vision, positive ETCO2 and breath sounds checked- equal and bilateral Secured at: 21 cm Tube secured with: Tape Dental Injury: Teeth and Oropharynx as per pre-operative assessment

## 2024-03-06 NOTE — Anesthesia Preprocedure Evaluation (Addendum)
 Anesthesia Evaluation  Patient identified by MRN, date of birth, ID band Patient awake    Reviewed: Allergy & Precautions, H&P , NPO status , Patient's Chart, lab work & pertinent test results  Airway Mallampati: III  TM Distance: >3 FB Neck ROM: Full    Dental  (+) Teeth Intact, Dental Advisory Given   Pulmonary neg pulmonary ROS   Pulmonary exam normal breath sounds clear to auscultation       Cardiovascular negative cardio ROS Normal cardiovascular exam Rhythm:Regular Rate:Normal     Neuro/Psych  Headaches PSYCHIATRIC DISORDERS Anxiety Depression       GI/Hepatic negative GI ROS, Neg liver ROS,,,  Endo/Other  BMI 34  Renal/GU negative Renal ROS  negative genitourinary   Musculoskeletal negative musculoskeletal ROS (+)    Abdominal  (+) + obese  Peds negative pediatric ROS (+)  Hematology negative hematology ROS (+) Hb 13.6, plt 244   Anesthesia Other Findings Tirzepatide LD: 8d  Reproductive/Obstetrics negative OB ROS                              Anesthesia Physical Anesthesia Plan  ASA: 2  Anesthesia Plan: General   Post-op Pain Management: Tylenol  PO (pre-op)*, Toradol  IV (intra-op)*, Ketamine  IV*, Dilaudid  IV and Precedex    Induction: Intravenous  PONV Risk Score and Plan: 4 or greater and Ondansetron , Dexamethasone , Midazolam , Scopolamine  patch - Pre-op and Treatment may vary due to age or medical condition  Airway Management Planned: Oral ETT  Additional Equipment: None  Intra-op Plan:   Post-operative Plan: Extubation in OR  Informed Consent: I have reviewed the patients History and Physical, chart, labs and discussed the procedure including the risks, benefits and alternatives for the proposed anesthesia with the patient or authorized representative who has indicated his/her understanding and acceptance.     Dental advisory given  Plan Discussed with:  CRNA  Anesthesia Plan Comments:          Anesthesia Quick Evaluation

## 2024-03-07 ENCOUNTER — Encounter (HOSPITAL_COMMUNITY): Payer: Self-pay | Admitting: Obstetrics and Gynecology

## 2024-03-07 ENCOUNTER — Observation Stay (HOSPITAL_COMMUNITY)

## 2024-03-07 DIAGNOSIS — D259 Leiomyoma of uterus, unspecified: Secondary | ICD-10-CM | POA: Diagnosis present

## 2024-03-07 DIAGNOSIS — I959 Hypotension, unspecified: Secondary | ICD-10-CM | POA: Diagnosis not present

## 2024-03-07 DIAGNOSIS — Z8249 Family history of ischemic heart disease and other diseases of the circulatory system: Secondary | ICD-10-CM | POA: Diagnosis not present

## 2024-03-07 DIAGNOSIS — R102 Pelvic and perineal pain: Secondary | ICD-10-CM | POA: Diagnosis present

## 2024-03-07 DIAGNOSIS — Z833 Family history of diabetes mellitus: Secondary | ICD-10-CM | POA: Diagnosis not present

## 2024-03-07 DIAGNOSIS — Z90710 Acquired absence of both cervix and uterus: Secondary | ICD-10-CM

## 2024-03-07 DIAGNOSIS — R012 Other cardiac sounds: Secondary | ICD-10-CM | POA: Diagnosis not present

## 2024-03-07 DIAGNOSIS — E8809 Other disorders of plasma-protein metabolism, not elsewhere classified: Secondary | ICD-10-CM | POA: Diagnosis present

## 2024-03-07 DIAGNOSIS — Z79899 Other long term (current) drug therapy: Secondary | ICD-10-CM | POA: Diagnosis not present

## 2024-03-07 DIAGNOSIS — D219 Benign neoplasm of connective and other soft tissue, unspecified: Secondary | ICD-10-CM | POA: Diagnosis not present

## 2024-03-07 DIAGNOSIS — T8111XA Postprocedural  cardiogenic shock, initial encounter: Secondary | ICD-10-CM | POA: Diagnosis not present

## 2024-03-07 DIAGNOSIS — D252 Subserosal leiomyoma of uterus: Secondary | ICD-10-CM | POA: Diagnosis present

## 2024-03-07 LAB — COMPREHENSIVE METABOLIC PANEL WITH GFR
ALT: 11 U/L (ref 0–44)
AST: 13 U/L — ABNORMAL LOW (ref 15–41)
Albumin: 2.8 g/dL — ABNORMAL LOW (ref 3.5–5.0)
Alkaline Phosphatase: 26 U/L — ABNORMAL LOW (ref 38–126)
Anion gap: 4 — ABNORMAL LOW (ref 5–15)
BUN: 8 mg/dL (ref 6–20)
CO2: 21 mmol/L — ABNORMAL LOW (ref 22–32)
Calcium: 8 mg/dL — ABNORMAL LOW (ref 8.9–10.3)
Chloride: 115 mmol/L — ABNORMAL HIGH (ref 98–111)
Creatinine, Ser: 0.79 mg/dL (ref 0.44–1.00)
GFR, Estimated: >60 mL/min (ref >60)
Glucose, Bld: 91 mg/dL (ref 70–99)
Potassium: 3.7 mmol/L (ref 3.5–5.1)
Sodium: 140 mmol/L (ref 135–145)
Total Bilirubin: 0.6 mg/dL (ref 0.0–1.2)
Total Protein: 5.1 g/dL — ABNORMAL LOW (ref 6.5–8.1)

## 2024-03-07 LAB — CBC
HCT: 30.5 % — ABNORMAL LOW (ref 36.0–46.0)
HCT: 30.3 % — ABNORMAL LOW (ref 36.0–46.0)
Hemoglobin: 10.4 g/dL — ABNORMAL LOW (ref 12.0–15.0)
Hemoglobin: 10.5 g/dL — ABNORMAL LOW (ref 12.0–15.0)
MCH: 31.1 pg (ref 26.0–34.0)
MCH: 31.6 pg (ref 26.0–34.0)
MCHC: 34.1 g/dL (ref 30.0–36.0)
MCHC: 34.7 g/dL (ref 30.0–36.0)
MCV: 91.3 fL (ref 80.0–100.0)
MCV: 91.3 fL (ref 80.0–100.0)
Platelets: 215 K/uL (ref 150–400)
Platelets: 211 K/uL (ref 150–400)
RBC: 3.34 MIL/uL — ABNORMAL LOW (ref 3.87–5.11)
RBC: 3.32 MIL/uL — ABNORMAL LOW (ref 3.87–5.11)
RDW: 12.8 % (ref 11.5–15.5)
RDW: 13 % (ref 11.5–15.5)
WBC: 6 K/uL (ref 4.0–10.5)
WBC: 5.4 K/uL (ref 4.0–10.5)
nRBC: 0 % (ref 0.0–0.2)
nRBC: 0 % (ref 0.0–0.2)

## 2024-03-07 LAB — SURGICAL PATHOLOGY

## 2024-03-07 MED ORDER — LACTATED RINGERS IV BOLUS
500.0000 mL | Freq: Once | INTRAVENOUS | Status: AC
  Administered 2024-03-07: 500 mL via INTRAVENOUS

## 2024-03-07 MED ORDER — IOHEXOL 350 MG/ML SOLN
120.0000 mL | Freq: Once | INTRAVENOUS | Status: AC | PRN
  Administered 2024-03-07: 120 mL via INTRAVENOUS

## 2024-03-07 MED ORDER — METHOCARBAMOL 500 MG PO TABS
500.0000 mg | ORAL_TABLET | Freq: Four times a day (QID) | ORAL | Status: DC | PRN
  Administered 2024-03-07 – 2024-03-08 (×4): 500 mg via ORAL
  Filled 2024-03-07 (×6): qty 1

## 2024-03-07 MED ORDER — SIMETHICONE 80 MG PO CHEW
80.0000 mg | CHEWABLE_TABLET | Freq: Four times a day (QID) | ORAL | Status: DC | PRN
  Administered 2024-03-07 (×2): 80 mg via ORAL
  Filled 2024-03-07 (×2): qty 1

## 2024-03-07 MED ORDER — OXYCODONE HCL 5 MG PO TABS
5.0000 mg | ORAL_TABLET | Freq: Four times a day (QID) | ORAL | Status: DC | PRN
  Administered 2024-03-08 – 2024-03-10 (×5): 5 mg via ORAL
  Filled 2024-03-07 (×5): qty 1

## 2024-03-07 MED ORDER — LACTATED RINGERS IV BOLUS
1000.0000 mL | Freq: Once | INTRAVENOUS | Status: AC
  Administered 2024-03-07: 1000 mL via INTRAVENOUS

## 2024-03-07 MED ORDER — KETOROLAC TROMETHAMINE 30 MG/ML IJ SOLN
30.0000 mg | Freq: Once | INTRAMUSCULAR | Status: AC
  Administered 2024-03-07: 30 mg via INTRAVENOUS
  Filled 2024-03-07: qty 1

## 2024-03-07 MED ORDER — LACTATED RINGERS IV SOLN: INTRAVENOUS | Status: AC

## 2024-03-07 NOTE — Progress Notes (Signed)
 Feels a little swimmy headed  Today's Vitals   03/07/24 0908 03/07/24 0910 03/07/24 0925 03/07/24 0930  BP: (!) 83/30 (!) 81/39 (!) 96/33 (!) 84/51  Pulse: 67 65 63 64  Resp:  14    Temp:  98.4 F (36.9 C)    TempSrc:  Oral    SpO2:  100%    Weight:      Height:      PainSc:       Body mass index is 34.19 kg/m.   NAD, smiling  A/P: D/W patient vital signs and need to evaluate for ongoing postoperative bleeding and evaluate ureters. CBC, CMET ordered.  D/W radiologist>CT W/WO contrast, hematuria protocol IV fluid bolus running. Patient states she understands and agrees

## 2024-03-07 NOTE — Op Note (Signed)
 NAME: Tiffany Donovan, Tiffany Donovan MEDICAL RECORD NO: [id number] ACCOUNT NO: [id number] DATE OF BIRTH: Jan 27, 1986 FACILITY: [facility] LOCATION: [facility] PHYSICIAN: Lynwood BRAVO. Zykera Abella II, MD  Operative Report   DATE OF PROCEDURE: 03/06/2024  PREOPERATIVE DIAGNOSIS:  Uterine leiomyomata.  POSTOPERATIVE DIAGNOSIS:  Uterine leiomyomata.  PROCEDURE:  Laparoscopically assisted vaginal hysterectomy with bilateral salpingectomy.  SURGEON:  Lynwood Clubs, MD.  ASSISTANT:  Truman Mayer, MD.  ESTIMATED BLOOD LOSS:  150 mL.  SPECIMENS:  Uterus and bilateral fallopian tubes to pathology.  INDICATIONS AND CONSENT:  This patient is a 38 year old patient with known uterine leiomyomata and secondary pain and heavy bleeding.  Details are dictated in the history and physical.  Laparoscopically assisted vaginal hysterectomy and bilateral  salpingectomy with removal of an ovary only if abnormal has been discussed preoperatively.  Potential risks and complications have been discussed preoperatively, including, but not limited to, infection, organ damage, bleeding requiring transfusion of  blood products with HIV and hepatitis acquisition, DVT, PE, pneumonia, and laparotomy.  The patient states understands and agrees, and consent is signed on the chart.  FINDINGS:  Upper abdomen is grossly normal.  In the pelvis, the uterus has a 6-cm subserosal fibroid on the anterior uterine fundus and a 2-cm subserosal fibroid in the midline anterior just below the larger one.  Tubes and ovaries were normal  bilaterally.  The anterior and posterior cul-de-sacs were normal.  DESCRIPTION OF PROCEDURE:  The patient was taken to the operating room, where she was identified, placed in the dorsal supine position, and general anesthesia was induced via endotracheal intubation.  She was placed in the dorsal lithotomy position.  She  was prepped vaginally with Hibiclens.  She was straight catheterized, and the abdomen was prepped with ChloraPrep.   Timeout was done.  She was draped in a sterile fashion.  A Hulka tenaculum has been placed in the uterus as a manipulator.  The  infraumbilical and suprapubic areas were injected in the midline with approximately 9 mL of 0.5% plain Marcaine.  A small infraumbilical incision was made.  A disposable Veress needle was placed on the first attempt without difficulty, and good syringe  and drop tests were noted.  Two liters of gas were insufflated under low pressure with good tympany in the right upper quadrant.  Veress needle was removed, and a 10-11 bladeless disposable trocar sleeve was placed using direct visualization without  difficulty.  After placement, the operative scope was used.  A small suprapubic incision was made and a 5-mm disposable trocar sleeve were placed under direct visualization without difficulty.  Inspection reveals the above findings.  Then, using the NCL  bipolar cautery cutting instrument, the right fallopian tube was taken down to its origin in the uterus.  The proximal ligaments were taken down to the level of vesicouterine peritoneum.  A similar procedure was carried out on the left side.   Vesicouterine peritoneum was taken down across the midline.  Good hemostasis was noted, and instruments were removed, and attention was turned to the vagina.  Posterior cul-de-sac was entered sharply, and the cervix was circumscribed with unipolar  cautery.  Mucosa was advanced sharply and bluntly.  Progressive bites with the LigaSure bipolar cautery cutting instrument were used to take down the uterosacral ligaments, bladder pillars, cardinal ligaments, and the uterine vessels bilaterally.  This  frees the entire specimen, which was delivered vaginally without difficulty.  All suture will be 0 Monocryl unless otherwise designated.  The uterosacral ligaments were plicated to the cuff bilaterally.  The cuff was then closed with figure-of-eight  sutures.  Also, bleeders on the left cuff were ligated  with figure-of-eights prior to this as well.  A Foley catheter was placed, and clear urine was noted.  Attention was returned to the abdomen.  Lavage was carried out.  Minor bipolar cautery was used  to obtain complete hemostasis at peritoneal edges.  Inspection under reduced pneumoperitoneum revealed good hemostasis all around.  Excess fluid was removed, and Arista was placed over the surgical bed.  Suprapubic trocar sleeve was removed.   Pneumoperitoneum was reduced, and the umbilical trocar sleeve was removed.  The umbilical incision was closed with 0 Vicryl in the deeper layers under excellent visualization, and the skin on both incisions was closed with interrupted 2-0 Vicryl.   Dermabond was placed on both sites as well.  All counts are correct.  The patient was awakened and taken to the recovery room in stable condition.    MUK D: 03/06/2024 9:46:40 am T: 03/07/2024 1:37:00 am  JOB: [id number]/ [id number]

## 2024-03-07 NOTE — Progress Notes (Signed)
 POD #1 Up to BR, voiding, passing flatus  Today's Vitals   03/07/24 0406 03/07/24 0656 03/07/24 0741 03/07/24 0748  BP: (!) 91/41     Pulse: (!) 56     Resp: 16     Temp: 98 F (36.7 C)     TempSrc:      SpO2:      Weight:      Height:      PainSc:  7  6  6     Body mass index is 34.19 kg/m.   Abdomen soft, BS+, NT Incisions healing well  Results for orders placed or performed during the hospital encounter of 03/06/24 (from the past 24 hours)  CBC     Status: Abnormal   Collection Time: 03/06/24  2:46 PM  Result Value Ref Range   WBC 8.4 4.0 - 10.5 K/uL   RBC 3.77 (L) 3.87 - 5.11 MIL/uL   Hemoglobin 11.8 (L) 12.0 - 15.0 g/dL   HCT 65.7 (L) 63.9 - 53.9 %   MCV 90.7 80.0 - 100.0 fL   MCH 31.3 26.0 - 34.0 pg   MCHC 34.5 30.0 - 36.0 g/dL   RDW 87.3 88.4 - 84.4 %   Platelets 222 150 - 400 K/uL   nRBC 0.0 0.0 - 0.2 %  CBC     Status: Abnormal   Collection Time: 03/07/24  7:22 AM  Result Value Ref Range   WBC 6.0 4.0 - 10.5 K/uL   RBC 3.34 (L) 3.87 - 5.11 MIL/uL   Hemoglobin 10.4 (L) 12.0 - 15.0 g/dL   HCT 69.4 (L) 63.9 - 53.9 %   MCV 91.3 80.0 - 100.0 fL   MCH 31.1 26.0 - 34.0 pg   MCHC 34.1 30.0 - 36.0 g/dL   RDW 87.1 88.4 - 84.4 %   Platelets 215 150 - 400 K/uL   nRBC 0.0 0.0 - 0.2 %    A/P: Repeat CBC at noon         D/W patient

## 2024-03-07 NOTE — Progress Notes (Signed)
 03/07/2024 Reviewed chart with primary surgeon.  Agree BP/HR could be related to meds and relative fluid shifts. Baseline Bps on review of chart are anywhere between 90-100/ 55-70 so may not be that far off.  EKG benign sinus brady.  Would do some additional fluids, consider dropping oxycodone in half and test orthostatic vitals in AM.  If still symptomatic could consider echo and/or cortisol check.  If doing okay should be fine for discharge and OP f/u.  Happy to see formally if needed.  Rolan Sharps MD Pulm Crit Care

## 2024-03-07 NOTE — Progress Notes (Signed)
 Tiffany Donovan has right pelvic and flank pain-using Kpad. Mildly swimmy headed when up to BR.  No N/V, passing flatus  Today's Vitals   03/07/24 1248 03/07/24 1251 03/07/24 1300 03/07/24 1354  BP: (!) 94/28 (!) 92/47 (!) 84/49   Pulse: (!) 59 61    Resp:   17   Temp:  (!) 97.5 F (36.4 C) 98.2 F (36.8 C)   TempSrc:  Oral    SpO2:   100%   Weight:      Height:      PainSc:    7    Body mass index is 34.19 kg/m.   Results for orders placed or performed during the hospital encounter of 03/06/24 (from the past 24 hours)  CBC     Status: Abnormal   Collection Time: 03/06/24  2:46 PM  Result Value Ref Range   WBC 8.4 4.0 - 10.5 K/uL   RBC 3.77 (L) 3.87 - 5.11 MIL/uL   Hemoglobin 11.8 (L) 12.0 - 15.0 g/dL   HCT 65.7 (L) 63.9 - 53.9 %   MCV 90.7 80.0 - 100.0 fL   MCH 31.3 26.0 - 34.0 pg   MCHC 34.5 30.0 - 36.0 g/dL   RDW 87.3 88.4 - 84.4 %   Platelets 222 150 - 400 K/uL   nRBC 0.0 0.0 - 0.2 %  CBC     Status: Abnormal   Collection Time: 03/07/24  7:22 AM  Result Value Ref Range   WBC 6.0 4.0 - 10.5 K/uL   RBC 3.34 (L) 3.87 - 5.11 MIL/uL   Hemoglobin 10.4 (L) 12.0 - 15.0 g/dL   HCT 69.4 (L) 63.9 - 53.9 %   MCV 91.3 80.0 - 100.0 fL   MCH 31.1 26.0 - 34.0 pg   MCHC 34.1 30.0 - 36.0 g/dL   RDW 87.1 88.4 - 84.4 %   Platelets 215 150 - 400 K/uL   nRBC 0.0 0.0 - 0.2 %  CBC     Status: Abnormal   Collection Time: 03/07/24 10:22 AM  Result Value Ref Range   WBC 5.4 4.0 - 10.5 K/uL   RBC 3.32 (L) 3.87 - 5.11 MIL/uL   Hemoglobin 10.5 (L) 12.0 - 15.0 g/dL   HCT 69.6 (L) 63.9 - 53.9 %   MCV 91.3 80.0 - 100.0 fL   MCH 31.6 26.0 - 34.0 pg   MCHC 34.7 30.0 - 36.0 g/dL   RDW 86.9 88.4 - 84.4 %   Platelets 211 150 - 400 K/uL   nRBC 0.0 0.0 - 0.2 %  Comprehensive metabolic panel     Status: Abnormal   Collection Time: 03/07/24 10:22 AM  Result Value Ref Range   Sodium 140 135 - 145 mmol/L   Potassium 3.7 3.5 - 5.1 mmol/L   Chloride 115 (H) 98 - 111 mmol/L   CO2 21 (L) 22 - 32  mmol/L   Glucose, Bld 91 70 - 99 mg/dL   BUN 8 6 - 20 mg/dL   Creatinine, Ser 9.20 0.44 - 1.00 mg/dL   Calcium 8.0 (L) 8.9 - 10.3 mg/dL   Total Protein 5.1 (L) 6.5 - 8.1 g/dL   Albumin  2.8 (L) 3.5 - 5.0 g/dL   AST 13 (L) 15 - 41 U/L   ALT 11 0 - 44 U/L   Alkaline Phosphatase 26 (L) 38 - 126 U/L   Total Bilirubin 0.6 0.0 - 1.2 mg/dL   GFR, Estimated >39 >39 mL/min   Anion gap 4 (L) 5 - 15  CLINICAL DATA:  Status post hysterectomy and salpingectomy on 03/06/2024 with right-sided abdominal pain and hypotension.   EXAM: CT ABDOMEN AND PELVIS WITHOUT AND WITH CONTRAST   TECHNIQUE: Multidetector CT imaging of the abdomen and pelvis was performed following the standard protocol before and following the bolus administration of intravenous contrast.   RADIATION DOSE REDUCTION: This exam was performed according to the departmental dose-optimization program which includes automated exposure control, adjustment of the mA and/or kV according to patient size and/or use of iterative reconstruction technique.   CONTRAST:  OMNIPAQUE  IOHEXOL  350 MG/ML SOLN   COMPARISON:  CT abdomen and pelvis dated 09/14/2021   FINDINGS: Lower chest: No focal consolidation or pulmonary nodule in the lung bases. No pleural effusion or pneumothorax demonstrated. Partially imaged heart size is normal.   Hepatobiliary: No focal hepatic lesions. No intra or extrahepatic biliary ductal dilation. Normal gallbladder.   Pancreas: No focal lesions or main ductal dilation.   Spleen: Normal in size without focal abnormality.   Adrenals/Urinary Tract: No adrenal nodules. No suspicious renal mass, calculi or hydronephrosis. Small volume intraluminal gas within the urinary bladder. No mural thickening.   Stomach/Bowel: Normal appearance of the stomach. No evidence of bowel wall thickening, distention, or inflammatory changes. No suspicious filling defect visualized within the opacified portions of the  collecting systems or ureters on delayed imaging. Gradual tapering of the ureters to the level of the ureterovesical junctions bilaterally. Normal appendix.   Vascular/Lymphatic: No significant vascular findings are present. No enlarged abdominal or pelvic lymph nodes.   Reproductive: Postsurgical changes of hysterectomy. Enhancement of the vaginal cuff, which contains trace fluid and gas. No adnexal masses.   Other: Small volume free air, likely postsurgical. Small volume pelvic free fluid and diffuse pelvic stranding.   Musculoskeletal: No acute or abnormal lytic or blastic osseous lesions. Trace subcutaneous emphysema at the umbilicus, likely postsurgical.   IMPRESSION: 1. Postsurgical changes of hysterectomy with enhancement of the vaginal cuff, which contains trace fluid and gas. 2. Small volume pelvic free fluid and diffuse pelvic stranding, likely postsurgical. 3. Small volume intraluminal gas within the urinary bladder, likely related to recent instrumentation. 4. No evidence of ureteral injury.     Electronically Signed   By: Limin  Xu M.D.   On: 03/07/2024 13:52   A/P: Appreciate Dr Theressa note. Also D/W informally with Dr Merla who was anesthesiologist for her surgery.         D/W patient.         Will change oxycodone  to 5 mg q6 hrs prn         IV fluid bolus 1000 ml now         Observe until tomorrow.

## 2024-03-08 ENCOUNTER — Inpatient Hospital Stay (HOSPITAL_COMMUNITY)

## 2024-03-08 DIAGNOSIS — T8111XA Postprocedural  cardiogenic shock, initial encounter: Secondary | ICD-10-CM | POA: Diagnosis not present

## 2024-03-08 DIAGNOSIS — D219 Benign neoplasm of connective and other soft tissue, unspecified: Secondary | ICD-10-CM

## 2024-03-08 DIAGNOSIS — R012 Other cardiac sounds: Secondary | ICD-10-CM

## 2024-03-08 LAB — ECHOCARDIOGRAM COMPLETE
AR max vel: 2.94 cm2
AV Peak grad: 4.2 mmHg
Ao pk vel: 1.02 m/s
Area-P 1/2: 3.65 cm2
Height: 63 in
S' Lateral: 2.9 cm
Weight: 3088 [oz_av]

## 2024-03-08 LAB — COMPREHENSIVE METABOLIC PANEL WITH GFR
ALT: 17 U/L (ref 0–44)
AST: 18 U/L (ref 15–41)
Albumin: 2.8 g/dL — ABNORMAL LOW (ref 3.5–5.0)
Alkaline Phosphatase: 23 U/L — ABNORMAL LOW (ref 38–126)
Anion gap: 7 (ref 5–15)
BUN: 8 mg/dL (ref 6–20)
CO2: 19 mmol/L — ABNORMAL LOW (ref 22–32)
Calcium: 7.9 mg/dL — ABNORMAL LOW (ref 8.9–10.3)
Chloride: 116 mmol/L — ABNORMAL HIGH (ref 98–111)
Creatinine, Ser: 0.82 mg/dL (ref 0.44–1.00)
GFR, Estimated: >60 mL/min (ref >60)
Glucose, Bld: 82 mg/dL (ref 70–99)
Potassium: 3.6 mmol/L (ref 3.5–5.1)
Sodium: 142 mmol/L (ref 135–145)
Total Bilirubin: 0.4 mg/dL (ref 0.0–1.2)
Total Protein: 4.7 g/dL — ABNORMAL LOW (ref 6.5–8.1)

## 2024-03-08 LAB — URINALYSIS, ROUTINE W REFLEX MICROSCOPIC
Bacteria, UA: NONE SEEN
Bilirubin Urine: NEGATIVE
Glucose, UA: NEGATIVE mg/dL
Ketones, ur: NEGATIVE mg/dL
Leukocytes,Ua: NEGATIVE
Nitrite: NEGATIVE
Protein, ur: NEGATIVE mg/dL
Specific Gravity, Urine: 1.012 (ref 1.005–1.030)
pH: 6 (ref 5.0–8.0)

## 2024-03-08 LAB — CORTISOL: Cortisol, Plasma: 6.4 ug/dL

## 2024-03-08 MED ORDER — MIDODRINE HCL 5 MG PO TABS
5.0000 mg | ORAL_TABLET | Freq: Three times a day (TID) | ORAL | Status: DC
  Administered 2024-03-08 (×2): 5 mg via ORAL
  Filled 2024-03-08 (×3): qty 1

## 2024-03-08 MED ORDER — ALBUMIN HUMAN 25 % IV SOLN
50.0000 g | Freq: Once | INTRAVENOUS | Status: AC
  Administered 2024-03-08: 50 g via INTRAVENOUS
  Filled 2024-03-08: qty 200

## 2024-03-08 MED ORDER — LIDOCAINE 5 % EX PTCH
1.0000 | MEDICATED_PATCH | CUTANEOUS | Status: DC
  Administered 2024-03-08 – 2024-03-10 (×3): 1 via TRANSDERMAL
  Filled 2024-03-08 (×3): qty 1

## 2024-03-08 MED ORDER — SUMATRIPTAN SUCCINATE 50 MG PO TABS
50.0000 mg | ORAL_TABLET | ORAL | Status: DC | PRN
  Administered 2024-03-08: 50 mg via ORAL
  Filled 2024-03-08 (×2): qty 1

## 2024-03-08 MED ORDER — MENTHOL 3 MG MT LOZG
1.0000 | LOZENGE | OROMUCOSAL | Status: DC | PRN
Start: 2024-03-08 — End: 2024-03-10
  Administered 2024-03-08: 3 mg via ORAL
  Filled 2024-03-08: qty 9

## 2024-03-08 NOTE — Progress Notes (Signed)
 Echocardiogram 2D Echocardiogram has been performed.  Thea Norlander 03/08/2024, 9:17 AM

## 2024-03-08 NOTE — Consult Note (Addendum)
 NAME:  Tiffany Donovan, MRN:  [id number], DOB:  19-Aug-1985, LOS: 1 ADMISSION DATE:  03/06/2024, CONSULTATION DATE:  03/08/24 REFERRING MD:  Curlene, CHIEF COMPLAINT:  postop   History of Present Illness:  38 year old woman presenting for definitive management of symptomatic uterine fibroids.  She underwent TAH 03/07/24 without complication.  Postop persistent hypotension refractory to fluids.  PCCM consulted to assist.  Baseline Bps in the 90s/50s.  Definitely feels dizzy when standing up postop.  Pain is somewhat controlled 7/10 R flank and abd.  Only cardiac hx is some palpitations she underwent usual cardiac workup for that have since self-resolved.  Normally MMRC0, active, no cardiopulmonary issues.  Pertinent  Medical History  Anxiety Depression Prior diverticulitis Fibrois Palpitations (see [facility] note 2021)  Significant Hospital Events: Including procedures, antibiotic start and stop dates in addition to other pertinent events     Interim History / Subjective:   Past Medical History:  Diagnosis Date   Anxiety    Depression    Diverticulitis    Diverticulosis    Low back pain    Migraine headache    Obesity    Premenstrual dysphoric disorder      Objective    Blood pressure (!) 106/49, pulse (!) 57, temperature 97.8 F (36.6 C), temperature source Oral, resp. rate 17, height 5' 3 (1.6 m), weight 87.5 kg, last menstrual period 02/07/2024, SpO2 100%.        Intake/Output Summary (Last 24 hours) at 03/08/2024 0811 Last data filed at 03/08/2024 0551 Gross per 24 hour  Intake 240 ml  Output --  Net 240 ml   Filed Weights   03/06/24 0558  Weight: 87.5 kg    Examination: General: well appearing HENT: MMM, trachea midlein Lungs: clear, no wheezing Cardiovascular: normal, no murmurs, ext warm Abdomen: incision sites dressed without strikethrough Extremities: no edema Neuro: nonfocal, moving everything Skin: no rashes  Labs/imaging reviewed. Hyperchloremic  NAGMA from IVF, CBC looks fine  Assessment and Plan  Postop vasoplegia and orthostatic intolerance- baseline low BP but not symptomatic.  Likely related to fluid shifts, changes in afterload demand on heart and potentially some hormonal effects.  Nothing on her labs or history is concerning for something that should not resolve over time.  Will do a couple more tests for completeness' sake but hopefully can go home soon.  - Check UA (flank pain probably postop but can double-check), cortisol, echo - Empiric midodrine , albumin  50g x 1 - Check orthostatics after midodrine  - If echo okay, orthostatics improved can go home on midodrine  and self-taper off as body recovers   Labs   CBC: Recent Labs  Lab 03/06/24 1446 03/07/24 0722 03/07/24 1022  WBC 8.4 6.0 5.4  HGB 11.8* 10.4* 10.5*  HCT 34.2* 30.5* 30.3*  MCV 90.7 91.3 91.3  PLT 222 215 211    Basic Metabolic Panel: Recent Labs  Lab 03/07/24 1022 03/08/24 0616  NA 140 142  K 3.7 3.6  CL 115* 116*  CO2 21* 19*  GLUCOSE 91 82  BUN 8 8  CREATININE 0.79 0.82  CALCIUM 8.0* 7.9*   GFR: Estimated Creatinine Clearance: 97.5 mL/min (by C-G formula based on SCr of 0.82 mg/dL). Recent Labs  Lab 03/06/24 1446 03/07/24 0722 03/07/24 1022  WBC 8.4 6.0 5.4    Liver Function Tests: Recent Labs  Lab 03/07/24 1022 03/08/24 0616  AST 13* 18  ALT 11 17  ALKPHOS 26* 23*  BILITOT 0.6 0.4  PROT 5.1* 4.7*  ALBUMIN  2.8* 2.8*   No results for input(s): LIPASE, AMYLASE in the last 168 hours. No results for input(s): AMMONIA in the last 168 hours.  ABG No results found for: PHART, PCO2ART, PO2ART, HCO3, TCO2, ACIDBASEDEF, O2SAT   Coagulation Profile: No results for input(s): INR, PROTIME in the last 168 hours.  Cardiac Enzymes: No results for input(s): CKTOTAL, CKMB, CKMBINDEX, TROPONINI in the last 168 hours.  HbA1C: No results found for: HGBA1C  CBG: No results for input(s): GLUCAP in  the last 168 hours.  Review of Systems:    Positive Symptoms in bold:  Constitutional fevers, chills, weight loss, fatigue, anorexia, malaise  Eyes decreased vision, double vision, eye irritation  Ears, Nose, Mouth, Throat sore throat, trouble swallowing, sinus congestion  Cardiovascular chest pain, paroxysmal nocturnal dyspnea, lower ext edema, palpitations   Respiratory SOB, cough, DOE, hemoptysis, wheezing  Gastrointestinal nausea, vomiting, diarrhea  Genitourinary burning with urination, trouble urinating  Musculoskeletal joint aches, joint swelling, back pain  Integumentary  rashes, skin lesions  Neurological focal weakness, focal numbness, trouble speaking, headaches  Psychiatric depression, anxiety, confusion  Endocrine polyuria, polydipsia, cold intolerance, heat intolerance  Hematologic abnormal bruising, abnormal bleeding, unexplained nose bleeds  Allergic/Immunologic recurrent infections, hives, swollen lymph nodes     Past Medical History:  She,  has a past medical history of Anxiety, Depression, Diverticulitis, Diverticulosis, Low back pain, Migraine headache, Obesity, and Premenstrual dysphoric disorder.   Surgical History:   Past Surgical History:  Procedure Laterality Date   BREAST BIOPSY Right 12/27/2023   MM RT BREAST BX W LOC DEV 1ST LESION IMAGE BX SPEC STEREO GUIDE 12/27/2023 [facility]   LAPAROSCOPIC VAGINAL HYSTERECTOMY WITH SALPINGECTOMY Bilateral 03/06/2024   Procedure: HYSTERECTOMY, VAGINAL, LAPAROSCOPY-ASSISTED, WITH SALPINGECTOMY;  Surgeon: Curlene Agent, MD;  Location: [facility] OR;  Service: Gynecology;  Laterality: Bilateral;     Social History:   reports that she has never smoked. She has never used smokeless tobacco. She reports that she does not currently use alcohol. She reports that she does not use drugs.   Family History:  Her family history includes Breast cancer in her paternal grandmother; Clotting disorder in her father and paternal  grandmother; Colon cancer in her paternal grandfather; Coronary artery disease in her paternal grandfather and paternal grandmother; Cystic fibrosis in her cousin; Depression in her brother and mother; Diabetes in her father; Diverticulosis in her sister; Hypertension in her father; Irritable bowel syndrome in her sister and sister; Kidney disease in her father; Migraines in her sister and sister. There is no history of Esophageal cancer, Stomach cancer, or Rectal cancer.   Allergies No Known Allergies   Home Medications  Prior to Admission medications   Medication Sig Start Date End Date Taking? Authorizing Provider  TIRZEPATIDE [place] Inject 0.48 Units into the skin every 7 (seven) days.   Yes [provider]  topiramate  (TOPAMAX ) 50 MG tablet Take 3 tablets (150 mg total) by mouth at bedtime. 12/27/23  Yes Bedsole, Amy E, MD  buPROPion  (WELLBUTRIN  XL) 300 MG 24 hr tablet TAKE ONE TABLET BY MOUTH ONE TIME DAILY Patient not taking: Reported on 02/27/2024 09/18/23   Avelina Greig BRAVO, MD  ondansetron  (ZOFRAN -ODT) 4 MG disintegrating tablet Take 1 tablet (4 mg total) by mouth every 8 (eight) hours as needed. Patient not taking: Reported on 02/27/2024 10/08/23   Small, Brooke L, PA  SUMAtriptan  (IMITREX ) 100 MG tablet Take 1 tablet (100 mg total) by mouth once for 1 dose. May repeat in 2 hours if  headache persists or recurs. Patient not taking: Reported on 02/27/2024 04/11/23 03/24/24  Bedsole, Amy E, MD

## 2024-03-08 NOTE — Progress Notes (Signed)
 POD #2 Tolerating regular diet, passing flatus, voiding Ambulated in hallway yesterday  Right flank pain responds to heat and pain meds  Today's Vitals   03/08/24 0110 03/08/24 0551 03/08/24 0604 03/08/24 0649  BP:  (!) 106/49    Pulse:  (!) 57    Resp:  17    Temp:  97.8 F (36.6 C)    TempSrc:  Oral    SpO2:  100%    Weight:      Height:      PainSc: 7   7  5     Body mass index is 34.19 kg/m.   Abdomen soft, incisions healing well  Results for orders placed or performed during the hospital encounter of 03/06/24 (from the past 24 hours)  CBC     Status: Abnormal   Collection Time: 03/07/24 10:22 AM  Result Value Ref Range   WBC 5.4 4.0 - 10.5 K/uL   RBC 3.32 (L) 3.87 - 5.11 MIL/uL   Hemoglobin 10.5 (L) 12.0 - 15.0 g/dL   HCT 69.6 (L) 63.9 - 53.9 %   MCV 91.3 80.0 - 100.0 fL   MCH 31.6 26.0 - 34.0 pg   MCHC 34.7 30.0 - 36.0 g/dL   RDW 86.9 88.4 - 84.4 %   Platelets 211 150 - 400 K/uL   nRBC 0.0 0.0 - 0.2 %  Comprehensive metabolic panel     Status: Abnormal   Collection Time: 03/07/24 10:22 AM  Result Value Ref Range   Sodium 140 135 - 145 mmol/L   Potassium 3.7 3.5 - 5.1 mmol/L   Chloride 115 (H) 98 - 111 mmol/L   CO2 21 (L) 22 - 32 mmol/L   Glucose, Bld 91 70 - 99 mg/dL   BUN 8 6 - 20 mg/dL   Creatinine, Ser 9.20 0.44 - 1.00 mg/dL   Calcium 8.0 (L) 8.9 - 10.3 mg/dL   Total Protein 5.1 (L) 6.5 - 8.1 g/dL   Albumin  2.8 (L) 3.5 - 5.0 g/dL   AST 13 (L) 15 - 41 U/L   ALT 11 0 - 44 U/L   Alkaline Phosphatase 26 (L) 38 - 126 U/L   Total Bilirubin 0.6 0.0 - 1.2 mg/dL   GFR, Estimated >39 >39 mL/min   Anion gap 4 (L) 5 - 15  Comprehensive metabolic panel     Status: Abnormal   Collection Time: 03/08/24  6:16 AM  Result Value Ref Range   Sodium 142 135 - 145 mmol/L   Potassium 3.6 3.5 - 5.1 mmol/L   Chloride 116 (H) 98 - 111 mmol/L   CO2 19 (L) 22 - 32 mmol/L   Glucose, Bld 82 70 - 99 mg/dL   BUN 8 6 - 20 mg/dL   Creatinine, Ser 9.17 0.44 - 1.00 mg/dL    Calcium 7.9 (L) 8.9 - 10.3 mg/dL   Total Protein 4.7 (L) 6.5 - 8.1 g/dL   Albumin  2.8 (L) 3.5 - 5.0 g/dL   AST 18 15 - 41 U/L   ALT 17 0 - 44 U/L   Alkaline Phosphatase 23 (L) 38 - 126 U/L   Total Bilirubin 0.4 0.0 - 1.2 mg/dL   GFR, Estimated >39 >39 mL/min   Anion gap 7 5 - 15    A/P: Doing well from a surgical standpoint         D/W discharge, instructions         Will add a Lidoderm  patch to right flank prn  Discharge home with Tylenol /ibuprofen /Robaxin /Oxycodone /Lidoderm  patch prn, iron supplement-instructions discussed.          Consult to Dr Claudene of critical care today before discharge for his evaluation of BP          FU office 2 weeks

## 2024-03-08 NOTE — Progress Notes (Signed)
 Orthostatic vital signs completed by nurse tech @ 1317  Lying: BP 94/43  HR 54 Sitting: BP 99/56  HR 59 Standing: BP 106/56       HR 56 Standing at 3 minutes: BP 103/48       HR 60

## 2024-03-09 DIAGNOSIS — D219 Benign neoplasm of connective and other soft tissue, unspecified: Secondary | ICD-10-CM | POA: Diagnosis not present

## 2024-03-09 DIAGNOSIS — T8111XA Postprocedural  cardiogenic shock, initial encounter: Secondary | ICD-10-CM | POA: Diagnosis not present

## 2024-03-09 LAB — CBC
HCT: 31.6 % — ABNORMAL LOW (ref 36.0–46.0)
Hemoglobin: 10.7 g/dL — ABNORMAL LOW (ref 12.0–15.0)
MCH: 31.2 pg (ref 26.0–34.0)
MCHC: 33.9 g/dL (ref 30.0–36.0)
MCV: 92.1 fL (ref 80.0–100.0)
Platelets: 198 K/uL (ref 150–400)
RBC: 3.43 MIL/uL — ABNORMAL LOW (ref 3.87–5.11)
RDW: 12.6 % (ref 11.5–15.5)
WBC: 4 K/uL (ref 4.0–10.5)
nRBC: 0 % (ref 0.0–0.2)

## 2024-03-09 MED ORDER — METHOCARBAMOL 500 MG PO TABS
1000.0000 mg | ORAL_TABLET | Freq: Four times a day (QID) | ORAL | Status: DC | PRN
  Administered 2024-03-09 – 2024-03-10 (×3): 1000 mg via ORAL
  Filled 2024-03-09 (×4): qty 2

## 2024-03-09 MED ORDER — ALBUMIN HUMAN 25 % IV SOLN
25.0000 g | Freq: Four times a day (QID) | INTRAVENOUS | Status: AC
  Administered 2024-03-09 – 2024-03-10 (×4): 25 g via INTRAVENOUS
  Filled 2024-03-09 (×4): qty 100

## 2024-03-09 MED ORDER — BUTALBITAL-APAP-CAFFEINE 50-325-40 MG PO TABS
2.0000 | ORAL_TABLET | Freq: Four times a day (QID) | ORAL | Status: DC | PRN
  Administered 2024-03-09: 2 via ORAL
  Filled 2024-03-09: qty 2

## 2024-03-09 NOTE — Progress Notes (Signed)
 Tolerating regular diet, passing flatus, no N/V, voiding well Feels a little dizzy when walking. Notes Hx of low BPs but not symptomatic in past Hx of migraines. Onset of HA yesterday, behind her ear, worse if she opens her mouth wide, typical of her migraines. Imitrex  yesterday gave some relief until she was up on her feet today>now returned.  Lidoderm  patch helps with right flank pain  Today's Vitals   03/09/24 0811 03/09/24 0815 03/09/24 0948 03/09/24 1100  BP:  (!) 94/42    Pulse:  63    Resp:  16    Temp:  97.9 F (36.6 C)    TempSrc:  Oral    SpO2:  100%    Weight:      Height:      PainSc: 0-No pain  6  6    Body mass index is 34.19 kg/m.   Abdomen soft, NT, incisions healing well Compression stockings off (took off for sleep last night)  Echo done  A/P: -Post op stable with good bowel/bladder function, Hgb 10.5, CT with normal post op changes. Will increase methocarbamol  to 1000mg  q6hrs prn, Kpad prn         -Migraine HA- topiramate  150mg  at bedtime                              - add Fioricet  prn         - Post op vasoplegia and orthostatic intolerance - per Dr Claudene

## 2024-03-09 NOTE — Progress Notes (Signed)
   NAME:  Tiffany Donovan, MRN:  [id number], DOB:  Jan 01, 1986, LOS: 2 ADMISSION DATE:  03/06/2024, CONSULTATION DATE:  03/08/24 REFERRING MD:  Curlene, CHIEF COMPLAINT:  postop   History of Present Illness:  38 year old woman presenting for definitive management of symptomatic uterine fibroids.  She underwent TAH 03/07/24 without complication.  Postop persistent hypotension refractory to fluids.  PCCM consulted to assist.  Baseline Bps in the 90s/50s.  Definitely feels dizzy when standing up postop.  Pain is somewhat controlled 7/10 R flank and abd.  Only cardiac hx is some palpitations she underwent usual cardiac workup for that have since self-resolved.  Normally MMRC0, active, no cardiopulmonary issues.  Pertinent  Medical History  Anxiety Depression Prior diverticulitis Fibrois Palpitations (see [facility] note 2021)  Significant Hospital Events: Including procedures, antibiotic start and stop dates in addition to other pertinent events   8/15 PCCM consulted for assistance in managing hypotension 8/16 ECHO WNL 8/17 BP remains soft but MAPs improved slightly  Interim History / Subjective:  States she feels well overall apart from headache   Past Medical History:  Diagnosis Date   Anxiety    Depression    Diverticulitis    Diverticulosis    Low back pain    Migraine headache    Obesity    Premenstrual dysphoric disorder    Objective    Blood pressure (!) 99/48, pulse 62, temperature 98.1 F (36.7 C), temperature source Oral, resp. rate 18, height 5' 3 (1.6 m), weight 87.5 kg, last menstrual period 02/07/2024, SpO2 100%.       No intake or output data in the 24 hours ending 03/09/24 1203  Filed Weights   03/06/24 0558  Weight: 87.5 kg    Examination: General: Well appearing adult  female lying in bed, in NAD HEENT: [place]/AT, MM pink/moist, PERRL,  Neuro: Alert and oriented x3 CV: s1s2 regular rate and rhythm, no murmur, rubs, or gallops,  PULM:  Clear to auscultation, no  increased work of breathing, on RA GI: soft, bowel sounds active in all 4 quadrants, non-tender, non-distended, tolerating oral diet Extremities: warm/dry, no edema  Skin: no rashes or lesions   Assessment and Plan  Postop vasoplegia and orthostatic intolerance Hypoalbuminemia  -Baseline low BP but not symptomatic.  Likely related to fluid shifts, changes in afterload demand on heart and potentially some hormonal effects.  Nothing on her labs or history is concerning for something that should not resolve over time.  Will do a couple more tests for completeness' sake but hopefully can go home soon. - Echo with EF 65 to 70% with no WMA and normal RV function P: Albumin  remains low will order scheduled albumin  over the next 24 hours Encourage mobility Encourage oral intake Continue midodrine   Signature:   Jalie Eiland D. Harris, NP-C [facility] Personal contact information can be found on Amion  If no contact or response made please call [phone] 03/09/2024, 12:06 PM

## 2024-03-10 DIAGNOSIS — T8111XA Postprocedural  cardiogenic shock, initial encounter: Secondary | ICD-10-CM | POA: Diagnosis not present

## 2024-03-10 DIAGNOSIS — D219 Benign neoplasm of connective and other soft tissue, unspecified: Secondary | ICD-10-CM | POA: Diagnosis not present

## 2024-03-10 MED ORDER — FERROUS SULFATE 325 (65 FE) MG PO TBEC: 325.0000 mg | DELAYED_RELEASE_TABLET | Freq: Every day | ORAL | 3 refills | Status: DC

## 2024-03-10 MED ORDER — LIDOCAINE 5 % EX PTCH: 1.0000 | MEDICATED_PATCH | CUTANEOUS | 0 refills | Status: DC

## 2024-03-10 MED ORDER — IBUPROFEN 600 MG PO TABS: 600.0000 mg | ORAL_TABLET | Freq: Four times a day (QID) | ORAL | 0 refills | Status: DC | PRN

## 2024-03-10 MED ORDER — OXYCODONE HCL 5 MG PO TABS: 5.0000 mg | ORAL_TABLET | Freq: Four times a day (QID) | ORAL | 0 refills | Status: DC | PRN

## 2024-03-10 MED ORDER — ONDANSETRON HCL 4 MG PO TABS: 4.0000 mg | ORAL_TABLET | Freq: Three times a day (TID) | ORAL | 0 refills | Status: DC | PRN

## 2024-03-10 MED ORDER — BUTALBITAL-APAP-CAFFEINE 50-325-40 MG PO TABS: 2.0000 | ORAL_TABLET | Freq: Four times a day (QID) | ORAL | 0 refills | Status: DC | PRN

## 2024-03-10 MED ORDER — ACETAMINOPHEN 500 MG PO TABS: 1000.0000 mg | ORAL_TABLET | Freq: Four times a day (QID) | ORAL | 0 refills | Status: AC | PRN

## 2024-03-10 MED ORDER — METHOCARBAMOL 1000 MG PO TABS: 1000.0000 mg | ORAL_TABLET | Freq: Four times a day (QID) | ORAL | 0 refills | Status: DC | PRN

## 2024-03-10 NOTE — Progress Notes (Signed)
 POD #4 Feels much better Ambulating without dizziness Tolerating regular diet, passing flatus, voiding Good relief of HA with fioricet  Right flank pain decreasing She wants to go home  Today's Vitals   03/10/24 0359 03/10/24 0816 03/10/24 0825 03/10/24 0829  BP:   (!) 111/47   Pulse:   (!) 59   Resp:   15   Temp:   97.8 F (36.6 C)   TempSrc:   Oral   SpO2:   100%   Weight:      Height:      PainSc: Asleep 0-No pain  2    Body mass index is 34.19 kg/m.   Abdomen soft, incisions healing well  Results for orders placed or performed during the hospital encounter of 03/06/24 (from the past 24 hours)  CBC     Status: Abnormal   Collection Time: 03/09/24 12:31 PM  Result Value Ref Range   WBC 4.0 4.0 - 10.5 K/uL   RBC 3.43 (L) 3.87 - 5.11 MIL/uL   Hemoglobin 10.7 (L) 12.0 - 15.0 g/dL   HCT 68.3 (L) 63.9 - 53.9 %   MCV 92.1 80.0 - 100.0 fL   MCH 31.2 26.0 - 34.0 pg   MCHC 33.9 30.0 - 36.0 g/dL   RDW 87.3 88.4 - 84.4 %   Platelets 198 150 - 400 K/uL   nRBC 0.0 0.0 - 0.2 %    A/P: Doing well PO         BPs better         D/W discharge instructions, FU @ 2weeks PO         Cardiac FU per Critical Care Team

## 2024-03-10 NOTE — Progress Notes (Signed)
   NAME:  Tiffany Donovan, MRN:  [id number], DOB:  09-24-1985, LOS: 3 ADMISSION DATE:  03/06/2024, CONSULTATION DATE:  03/08/24 REFERRING MD:  Curlene, CHIEF COMPLAINT:  postop   History of Present Illness:  38 year old woman presenting for definitive management of symptomatic uterine fibroids.  She underwent TAH 03/07/24 without complication.  Postop persistent hypotension refractory to fluids.  PCCM consulted to assist.  Baseline Bps in the 90s/50s.  Definitely feels dizzy when standing up postop.  Pain is somewhat controlled 7/10 R flank and abd.  Only cardiac hx is some palpitations she underwent usual cardiac workup for that have since self-resolved.  Normally MMRC0, active, no cardiopulmonary issues.  Pertinent  Medical History  Anxiety Depression Prior diverticulitis Fibrois Palpitations (see [facility] note 2021)  Significant Hospital Events: Including procedures, antibiotic start and stop dates in addition to other pertinent events   8/15 PCCM consulted for assistance in managing hypotension 8/16 ECHO WNL 8/17 BP remains soft but MAPs improved slightly  Interim History / Subjective:  Patient denies any complaint, stated dizziness has resolved She was walked in the hallway without any symptoms, stated feeling better  Past Medical History:  Diagnosis Date   Anxiety    Depression    Diverticulitis    Diverticulosis    Low back pain    Migraine headache    Obesity    Premenstrual dysphoric disorder    Objective    Blood pressure (!) 95/50, pulse 64, temperature 98.1 F (36.7 C), temperature source Oral, resp. rate 16, height 5' 3 (1.6 m), weight 87.5 kg, last menstrual period 02/07/2024, SpO2 100%.        Intake/Output Summary (Last 24 hours) at 03/10/2024 1256 Last data filed at 03/09/2024 2145 Gross per 24 hour  Intake 196.06 ml  Output --  Net 196.06 ml    Filed Weights   03/06/24 0558  Weight: 87.5 kg    Examination: General: Young female, lying on the  bed HEENT: [place]/AT, eyes anicteric.  moist mucus membranes Neuro: Alert, awake following commands Chest: Coarse breath sounds, no wheezes or rhonchi Heart: Regular rate and rhythm, no murmurs or gallops Abdomen: Soft, nontender, nondistended, bowel sounds present   Labs and images reviewed  Assessment and Plan  Postop vasoplegia and orthostatic intolerance, likely related to fluid shifts and hormonal changes post hysterectomy Hypoalbuminemia  Baseline low BP but not symptomatic She is asymptomatic now Her H&H is stable, which ruled out bleeding Echocardiogram showed normal EF with no wall motion abnormalities Encouraged to drink plenty of fluid and eat healthy diet Minimize pain medications  From PCCM standpoint, patient can be discharged home  Valinda Novas, MD

## 2024-03-10 NOTE — Discharge Summary (Signed)
 Physician Discharge Summary  Patient ID: RAPHAELLA LARKIN MRN: [id number] DOB/AGE: [birth year] 38 y.o.  Admit date: 03/06/2024 Discharge date: 03/10/2024  Admission Diagnoses:  Discharge Diagnoses:  Principal Problem:   Fibroids Active Problems:   Preop testing   History of laparoscopic-assisted vaginal hysterectomy   Discharged Condition: good  Hospital Course: admitted for LAVH/BS. Surgery completed with EBL 150 ml. Post operative course marked by low BPs with pulse 50s-60s and adequate urine output. On day of surgery hgb=11.8 and abdominal exam was reassuring. She C/O post operative pain and methocarbamol  prn was added to her medication list. She was given IV fluid bolus and observed closely. In the am of POD #1 hgb = 10.4 and BPs unchanged. Repeat hgb 3-4 hours later was stable at 10.5. abdomino-pelvic CT notes normal post operative changes and no evidence of bleeding, also normal ureters and bowel. Chart reviewed with Dr Claudene of Critical Care. She received additional IV fluids. By POD #2 BPs continued 80s-occasional 100s with pulse 50s-60s. Consultation with Dr Claudene was done. Cardiac echo is normal she is give IV albumin  and a trial of midodrine . This produced bradycardia and was discontinue. Compression stockings were placed. She continued to have excellent progression of bowel/bladder function. Right flank pain is improved with lidoderm  patch. IV albumin  q6 hours is started and BPs improved. At discharge she is asymptomatic and wants to go home.   Consults: pulmonary/intensive care  Significant Diagnostic Studies: labs:  Results for orders placed or performed during the hospital encounter of 03/06/24 (from the past 72 hours)  Comprehensive metabolic panel     Status: Abnormal   Collection Time: 03/08/24  6:16 AM  Result Value Ref Range   Sodium 142 135 - 145 mmol/L   Potassium 3.6 3.5 - 5.1 mmol/L   Chloride 116 (H) 98 - 111 mmol/L   CO2 19 (L) 22 - 32 mmol/L   Glucose, Bld 82 70  - 99 mg/dL    Comment: Glucose reference range applies only to samples taken after fasting for at least 8 hours.   BUN 8 6 - 20 mg/dL   Creatinine, Ser 9.17 0.44 - 1.00 mg/dL   Calcium 7.9 (L) 8.9 - 10.3 mg/dL   Total Protein 4.7 (L) 6.5 - 8.1 g/dL   Albumin  2.8 (L) 3.5 - 5.0 g/dL   AST 18 15 - 41 U/L   ALT 17 0 - 44 U/L   Alkaline Phosphatase 23 (L) 38 - 126 U/L   Total Bilirubin 0.4 0.0 - 1.2 mg/dL   GFR, Estimated >39 >39 mL/min    Comment: (NOTE) Calculated using the CKD-EPI Creatinine Equation (2021)    Anion gap 7 5 - 15    Comment: Performed at [facility], [address], [place], [place] [address]  Urinalysis, Routine w reflex microscopic -Urine, Clean Catch     Status: Abnormal   Collection Time: 03/08/24  8:41 AM  Result Value Ref Range   Color, Urine YELLOW YELLOW   APPearance CLEAR CLEAR   Specific Gravity, Urine 1.012 1.005 - 1.030   pH 6.0 5.0 - 8.0   Glucose, UA NEGATIVE NEGATIVE mg/dL   Hgb urine dipstick SMALL (A) NEGATIVE   Bilirubin Urine NEGATIVE NEGATIVE   Ketones, ur NEGATIVE NEGATIVE mg/dL   Protein, ur NEGATIVE NEGATIVE mg/dL   Nitrite NEGATIVE NEGATIVE   Leukocytes,Ua NEGATIVE NEGATIVE   RBC / HPF 0-5 0 - 5 RBC/hpf   WBC, UA 0-5 0 - 5 WBC/hpf   Bacteria, UA  NONE SEEN NONE SEEN   Squamous Epithelial / HPF 0-5 0 - 5 /HPF   Mucus PRESENT     Comment: Performed at [facility], [address], [place], [place] [address]  Cortisol     Status: None   Collection Time: 03/08/24 12:32 PM  Result Value Ref Range   Cortisol, Plasma 6.4 ug/dL    Comment: (NOTE) AM    6.7 - 22.6 ug/dL PM   <89.9       ug/dL Performed at [facility], [address], [place], [place] [address]   CBC     Status: Abnormal   Collection Time: 03/09/24 12:31 PM  Result Value Ref Range   WBC 4.0 4.0 - 10.5 K/uL   RBC 3.43 (L) 3.87 - 5.11 MIL/uL   Hemoglobin 10.7 (L) 12.0 - 15.0 g/dL   HCT 68.3 (L) 63.9 - 53.9 %   MCV 92.1 80.0 - 100.0 fL   MCH 31.2 26.0  - 34.0 pg   MCHC 33.9 30.0 - 36.0 g/dL   RDW 87.3 88.4 - 84.4 %   Platelets 198 150 - 400 K/uL   nRBC 0.0 0.0 - 0.2 %    Comment: Performed at [facility], [address], [place], [place] [address]    and radiology: IMPRESSION: 1. Postsurgical changes of hysterectomy with enhancement of the vaginal cuff, which contains trace fluid and gas. 2. Small volume pelvic free fluid and diffuse pelvic stranding, likely postsurgical. 3. Small volume intraluminal gas within the urinary bladder, likely related to recent instrumentation. 4. No evidence of ureteral injury.  Treatments: IV hydration LAVH/BS  Discharge Exam: Blood pressure (!) 95/50, pulse 64, temperature 98.1 F (36.7 C), temperature source Oral, resp. rate 16, height 5' 3 (1.6 m), weight 87.5 kg, last menstrual period 02/07/2024, SpO2 100%. GI: soft, non-tender; bowel sounds normal; no masses,  no organomegaly  Disposition: Discharge disposition: 01-Home or Self Care        Allergies as of 03/10/2024   No Known Allergies      Medication List     STOP taking these medications    SUMAtriptan  100 MG tablet Commonly known as: IMITREX        TAKE these medications    acetaminophen  500 MG tablet Commonly known as: TYLENOL  Take 2 tablets (1,000 mg total) by mouth every 6 (six) hours as needed.   buPROPion  300 MG 24 hr tablet Commonly known as: WELLBUTRIN  XL TAKE ONE TABLET BY MOUTH ONE TIME DAILY   butalbital -acetaminophen -caffeine  50-325-40 MG tablet Commonly known as: FIORICET  Take 2 tablets by mouth every 6 (six) hours as needed for headache.   ferrous sulfate  325 (65 FE) MG EC tablet Take 1 tablet (325 mg total) by mouth daily with breakfast.   ibuprofen  600 MG tablet Commonly known as: ADVIL  Take 1 tablet (600 mg total) by mouth every 6 (six) hours as needed for cramping.   lidocaine  5 % Commonly known as: LIDODERM  Place 1 patch onto the skin daily. Remove & Discard patch within 12 hours or  as directed by MD Start taking on: March 11, 2024   Methocarbamol  1000 MG Tabs Take 1,000 mg by mouth every 6 (six) hours as needed for muscle spasms.   ondansetron  4 MG disintegrating tablet Commonly known as: ZOFRAN -ODT Take 1 tablet (4 mg total) by mouth every 8 (eight) hours as needed.   ondansetron  4 MG tablet Commonly known as: ZOFRAN  Take 1 tablet (4 mg total) by mouth  every 8 (eight) hours as needed for nausea.   oxyCODONE  5 MG immediate release tablet Commonly known as: Oxy IR/ROXICODONE  Take 1 tablet (5 mg total) by mouth every 6 (six) hours as needed for moderate pain (pain score 4-6).   TIRZEPATIDE [place] Inject 0.48 Units into the skin every 7 (seven) days.   topiramate  50 MG tablet Commonly known as: TOPAMAX  Take 3 tablets (150 mg total) by mouth at bedtime.         Signed: Judi Jaffe E Jillien Yakel II 03/10/2024, 2:19 PM

## 2024-04-16 ENCOUNTER — Emergency Department (HOSPITAL_BASED_OUTPATIENT_CLINIC_OR_DEPARTMENT_OTHER)
Admission: EM | Admit: 2024-04-16 | Discharge: 2024-04-16 | Disposition: A | Discharge status: Home / Self Care | Source: Ambulatory Visit | Attending: Emergency Medicine | Admitting: Emergency Medicine

## 2024-04-16 ENCOUNTER — Emergency Department (HOSPITAL_BASED_OUTPATIENT_CLINIC_OR_DEPARTMENT_OTHER)

## 2024-04-16 ENCOUNTER — Other Ambulatory Visit: Payer: Self-pay

## 2024-04-16 DIAGNOSIS — M545 Low back pain, unspecified: Secondary | ICD-10-CM | POA: Insufficient documentation

## 2024-04-16 DIAGNOSIS — R1033 Periumbilical pain: Secondary | ICD-10-CM | POA: Insufficient documentation

## 2024-04-16 DIAGNOSIS — Z90710 Acquired absence of both cervix and uterus: Secondary | ICD-10-CM | POA: Diagnosis not present

## 2024-04-16 LAB — COMPREHENSIVE METABOLIC PANEL WITH GFR
ALT: 10 U/L (ref 0–44)
AST: 15 U/L (ref 15–41)
Albumin: 4.7 g/dL (ref 3.5–5.0)
Alkaline Phosphatase: 47 U/L (ref 38–126)
Anion gap: 12 (ref 5–15)
BUN: 14 mg/dL (ref 6–20)
CO2: 19 mmol/L — ABNORMAL LOW (ref 22–32)
Calcium: 9.4 mg/dL (ref 8.9–10.3)
Chloride: 107 mmol/L (ref 98–111)
Creatinine, Ser: 0.66 mg/dL (ref 0.44–1.00)
GFR, Estimated: >60 mL/min (ref >60)
Glucose, Bld: 84 mg/dL (ref 70–99)
Potassium: 4 mmol/L (ref 3.5–5.1)
Sodium: 139 mmol/L (ref 135–145)
Total Bilirubin: 0.3 mg/dL (ref 0.0–1.2)
Total Protein: 7.4 g/dL (ref 6.5–8.1)

## 2024-04-16 LAB — URINALYSIS, ROUTINE W REFLEX MICROSCOPIC
Bacteria, UA: NONE SEEN
Bilirubin Urine: NEGATIVE
Glucose, UA: NEGATIVE mg/dL
Hgb urine dipstick: NEGATIVE
Ketones, ur: NEGATIVE mg/dL
Nitrite: NEGATIVE
Protein, ur: NEGATIVE mg/dL
Specific Gravity, Urine: 1.015 (ref 1.005–1.030)
pH: 6.5 (ref 5.0–8.0)

## 2024-04-16 LAB — CBC WITH DIFFERENTIAL/PLATELET
Abs Immature Granulocytes: 0.01 K/uL (ref 0.00–0.07)
Basophils Absolute: 0 K/uL (ref 0.0–0.1)
Basophils Relative: 1 %
Eosinophils Absolute: 0.5 K/uL (ref 0.0–0.5)
Eosinophils Relative: 9 %
HCT: 38.8 % (ref 36.0–46.0)
Hemoglobin: 13.3 g/dL (ref 12.0–15.0)
Immature Granulocytes: 0 %
Lymphocytes Relative: 26 %
Lymphs Abs: 1.3 K/uL (ref 0.7–4.0)
MCH: 31.2 pg (ref 26.0–34.0)
MCHC: 34.3 g/dL (ref 30.0–36.0)
MCV: 91.1 fL (ref 80.0–100.0)
Monocytes Absolute: 0.3 K/uL (ref 0.1–1.0)
Monocytes Relative: 5 %
Neutro Abs: 3.1 K/uL (ref 1.7–7.7)
Neutrophils Relative %: 59 %
Platelets: 262 K/uL (ref 150–400)
RBC: 4.26 MIL/uL (ref 3.87–5.11)
RDW: 12.3 % (ref 11.5–15.5)
WBC: 5.2 K/uL (ref 4.0–10.5)
nRBC: 0 % (ref 0.0–0.2)

## 2024-04-16 LAB — PREGNANCY, URINE: Preg Test, Ur: NEGATIVE

## 2024-04-16 MED ORDER — LIDOCAINE 5 % EX PTCH: 1.0000 | MEDICATED_PATCH | CUTANEOUS | 0 refills | Status: DC

## 2024-04-16 MED ORDER — CYCLOBENZAPRINE HCL 5 MG PO TABS: 10.0000 mg | ORAL_TABLET | Freq: Three times a day (TID) | ORAL | 0 refills | Status: DC | PRN

## 2024-04-16 MED ORDER — MELOXICAM 7.5 MG PO TABS: 7.5000 mg | ORAL_TABLET | Freq: Every day | ORAL | 0 refills | Status: DC

## 2024-04-16 MED ORDER — PREDNISONE 10 MG (21) PO TBPK: ORAL_TABLET | Freq: Every day | ORAL | 0 refills | Status: DC

## 2024-04-16 MED ORDER — IOHEXOL 300 MG/ML  SOLN
80.0000 mL | Freq: Once | INTRAMUSCULAR | Status: AC | PRN
  Administered 2024-04-16: 80 mL via INTRAVENOUS

## 2024-04-16 MED ORDER — KETOROLAC TROMETHAMINE 15 MG/ML IJ SOLN
15.0000 mg | Freq: Once | INTRAMUSCULAR | Status: AC
  Administered 2024-04-16: 15 mg via INTRAVENOUS
  Filled 2024-04-16: qty 1

## 2024-04-16 NOTE — ED Provider Triage Note (Signed)
 Emergency Medicine Provider Triage Evaluation Note  Tiffany Donovan , a 38 y.o. female  was evaluated in triage.  Pt complains of back pain and lower abdominal pain.  Pt advised to come in for imaging by her Gyn  Pt had a hysterectomy 8/14  Review of Systems  Positive: Back pain Negative: fever  Physical Exam  BP (!) 117/45 (BP Location: Right Arm)   Pulse 71   Temp 98.3 F (36.8 C) (Oral)   Resp 16   SpO2 100%  Gen:   Awake, no distress   Resp:  Normal effort  MSK:   Moves extremities without difficulty  Other:  Tender mid lower back  Abdomen tender left lower abdomen  Medical Decision Making  Medically screening exam initiated at 1:34 PM.  Appropriate orders placed.  Tiffany Donovan was informed that the remainder of the evaluation will be completed by another provider, this initial triage assessment does not replace that evaluation, and the importance of remaining in the ED until their evaluation is complete.     Tiffany Sonny POUR, PA-C 04/16/24 1337

## 2024-04-16 NOTE — Discharge Instructions (Addendum)
 It was a pleasure taking care of you today.  Based on your history, physical exam, as well as your labs and imaging I feel you are stable for discharge.  Today there was no acute abnormality noted on your scan or abnormality on the CT scan of your back.  Today we have changed up therapy, I have prescribed you Flexeril , lidocaine  patches, meloxicam , as well as prednisone  to try and help with your back pain.  Please pick up all these medications and take as instructed.  You may continue to take over-the-counter Tylenol  as well for help with pain and symptoms, please keep in mind that the max daily dose of Tylenol  is 4000 mg/day.  Please do not take more than 1000 mg of Tylenol  in 1 single dose.  Please keep your appointment with your surgeon next Tuesday and follow-up with him, please make him aware of your workup today as well as all findings.  Please also make your PCP aware of all findings.  If symptoms persist or worsen recommend follow-up within 1 week.  If you experience any of the following symptoms including but not limited to fever, chills, unexplained weakness, issues with coordination, worsening pain, radiating numbness/tingling, chest pain, shortness of breath, severe abdominal pain, intractable nausea/vomiting, pelvic pain, or other concerning symptom please return to the emergency department or seek further medical care.

## 2024-04-16 NOTE — ED Provider Notes (Signed)
 [place] EMERGENCY DEPARTMENT AT [facility] Provider Note   CSN: [id number] Arrival date & time: 04/16/24  1255     Patient presents with: Back Pain   Tiffany Donovan is a 38 y.o. female who presents to the emergency department with a chief complaint of low back pain, abdominal pain surrounding her umbilicus, as well as episodic right pelvic pain.  Of note patient had a recent hysterectomy a little over a month ago, she states that she has been recovering somewhat well but has had some postoperative pain.  She was prescribed a muscle relaxant medication for her low back pain by her surgeon approximately 1 week ago.  Patient has also been taking ibuprofen  800mg  with some relief. She states that the low back pain began last Tuesday and has persisted.  Denies pain radiation, numbness/tingling, weakness.  Denies history of previous back surgeries.  Denies chronic low back pain.  Denies fever, chills, chest pain, shortness of breath.  Patient states she was somewhat nauseated in the lobby but this has now subsided, denies vomiting.  Denies groin numbness/tingling, urinary/bowel incontinence or retention.  Patient is ambulatory without assistance.  Patient states that last Tuesday she went to go and clean out her cat litter boxes for the first time since her surgery and then woke up the next day with back pain.  She denies known back injury.  Past medical history significant for obesity, premenstrual dysphoric disorder, major depressive disorder, chronic low back pain without sciatica, generalized anxiety disorder, history of laparoscopic-assisted vaginal hysterectomy.    Back Pain      Prior to Admission medications   Medication Sig Start Date End Date Taking? Authorizing Provider  cyclobenzaprine  (FLEXERIL ) 5 MG tablet Take 2 tablets (10 mg total) by mouth 3 (three) times daily as needed for muscle spasms. 04/16/24  Yes Alexzavier Girardin F, PA-C  lidocaine  (LIDODERM ) 5 % Place 1 patch  onto the skin daily. Remove & Discard patch within 12 hours or as directed by MD 04/16/24  Yes Melena Hayes F, PA-C  meloxicam  (MOBIC ) 7.5 MG tablet Take 1 tablet (7.5 mg total) by mouth daily. 04/16/24  Yes Jj Enyeart F, PA-C  predniSONE  (STERAPRED UNI-PAK 21 TAB) 10 MG (21) TBPK tablet Take by mouth daily. Take 6 tabs by mouth daily  for 2 days, then 5 tabs for 2 days, then 4 tabs for 2 days, then 3 tabs for 2 days, 2 tabs for 2 days, then 1 tab by mouth daily for 2 days 04/16/24  Yes Darci Lykins F, PA-C  acetaminophen  (TYLENOL ) 500 MG tablet Take 2 tablets (1,000 mg total) by mouth every 6 (six) hours as needed. 03/10/24   Tomblin, James, MD  buPROPion  (WELLBUTRIN  XL) 300 MG 24 hr tablet TAKE ONE TABLET BY MOUTH ONE TIME DAILY Patient not taking: Reported on 02/27/2024 09/18/23   Avelina Greig BRAVO, MD  butalbital -acetaminophen -caffeine  (FIORICET ) 50-325-40 MG tablet Take 2 tablets by mouth every 6 (six) hours as needed for headache. 03/10/24   Curlene Agent, MD  ferrous sulfate  325 (65 FE) MG EC tablet Take 1 tablet (325 mg total) by mouth daily with breakfast. 03/10/24   Curlene Agent, MD  ondansetron  (ZOFRAN ) 4 MG tablet Take 1 tablet (4 mg total) by mouth every 8 (eight) hours as needed for nausea. 03/10/24   Tomblin, James, MD  ondansetron  (ZOFRAN -ODT) 4 MG disintegrating tablet Take 1 tablet (4 mg total) by mouth every 8 (eight) hours as needed. Patient not taking: Reported on 02/27/2024 10/08/23  Small, Brooke L, PA  oxyCODONE  (OXY IR/ROXICODONE ) 5 MG immediate release tablet Take 1 tablet (5 mg total) by mouth every 6 (six) hours as needed for moderate pain (pain score 4-6). 03/10/24   Tomblin, James, MD  TIRZEPATIDE [place] Inject 0.48 Units into the skin every 7 (seven) days.    [provider]  topiramate  (TOPAMAX ) 50 MG tablet Take 3 tablets (150 mg total) by mouth at bedtime. 12/27/23   Avelina Greig BRAVO, MD    Allergies: Patient has no known allergies.    Review of Systems   Musculoskeletal:  Positive for back pain.    Updated Vital Signs BP (!) 102/56   Pulse 68   Temp 98 F (36.7 C) (Oral)   Resp 16   SpO2 100%   Physical Exam Vitals and nursing note reviewed.  Constitutional:      General: She is not in acute distress.    Appearance: Normal appearance. She is not ill-appearing, toxic-appearing or diaphoretic.  HENT:     Head: Normocephalic and atraumatic.  Eyes:     General: No scleral icterus. Cardiovascular:     Rate and Rhythm: Normal rate and regular rhythm.  Pulmonary:     Effort: Pulmonary effort is normal. No respiratory distress.     Breath sounds: No wheezing, rhonchi or rales.  Abdominal:     General: There is no distension.     Tenderness: There is no abdominal tenderness. There is no right CVA tenderness, left CVA tenderness or guarding.  Musculoskeletal:        General: Normal range of motion.     Right lower leg: No edema.     Left lower leg: No edema.     Comments: Equal grip strength bilaterally, normal range of motion of bilateral upper extremities as well as lower extremities, good strength against resistance including plantar and dorsiflexion of bilateral lower extremities, bilateral upper and lower extremities neurovascularly intact, patient ambulatory without assistance  Paraspinal muscle tenderness of right and left low back worse on the left side of lumbar spine, no tenderness directly over lumbar spine  Skin:    General: Skin is warm.     Capillary Refill: Capillary refill takes less than 2 seconds.  Neurological:     General: No focal deficit present.     Mental Status: She is alert and oriented to person, place, and time.  Psychiatric:        Mood and Affect: Mood normal.        Behavior: Behavior normal.     (all labs ordered are listed, but only abnormal results are displayed) Labs Reviewed  URINALYSIS, ROUTINE W REFLEX MICROSCOPIC - Abnormal; Notable for the following components:      Result Value    Leukocytes,Ua SMALL (*)    All other components within normal limits  COMPREHENSIVE METABOLIC PANEL WITH GFR - Abnormal; Notable for the following components:   CO2 19 (*)    All other components within normal limits  CBC WITH DIFFERENTIAL/PLATELET  PREGNANCY, URINE    EKG: None  Radiology: CT L-SPINE NO CHARGE Result Date: 04/16/2024 CLINICAL DATA:  Back pain and lower abdominal pain. EXAM: CT LUMBAR SPINE WITHOUT CONTRAST TECHNIQUE: Multidetector CT imaging of the lumbar spine was performed without intravenous contrast administration. Multiplanar CT image reconstructions were also generated. RADIATION DOSE REDUCTION: This exam was performed according to the departmental dose-optimization program which includes automated exposure control, adjustment of the mA and/or kV according to patient size and/or use of  iterative reconstruction technique. COMPARISON:  Lumbar spine radiographs 06/09/2016. CT abdomen and pelvis 03/07/2024 FINDINGS: Segmentation: 5 lumbar type vertebral bodies. Normal alignment of the lumbar spine. Alignment: Normal. Vertebrae: No vertebral compression deformities. No focal bone lesion or bone destruction. Bone cortex appears intact. Intervertebral disc space heights are normal. Visualized sacrum appears intact. Paraspinal and other soft tissues: No abnormal paraspinal soft tissue mass or infiltration. Paraspinal musculature is symmetrical. Disc levels: Intervertebral disc space heights are normal. No evidence of bone encroachment upon the central canal. IMPRESSION: Normal alignment of the lumbar spine. No acute displaced fractures are identified. Electronically Signed   By: Elsie Gravely M.D.   On: 04/16/2024 17:21   CT ABDOMEN PELVIS W CONTRAST Result Date: 04/16/2024 CLINICAL DATA:  Acute nonlocalized abdominal pain. EXAM: CT ABDOMEN AND PELVIS WITH CONTRAST TECHNIQUE: Multidetector CT imaging of the abdomen and pelvis was performed using the standard protocol following  bolus administration of intravenous contrast. RADIATION DOSE REDUCTION: This exam was performed according to the departmental dose-optimization program which includes automated exposure control, adjustment of the mA and/or kV according to patient size and/or use of iterative reconstruction technique. CONTRAST:  80mL OMNIPAQUE  IOHEXOL  300 MG/ML  SOLN COMPARISON:  03/07/2024 FINDINGS: Lower chest: Lung bases are clear. Hepatobiliary: Focal area of low-attenuation in the medial segment left lobe of liver likely representing focal fatty infiltration. Gallbladder and bile ducts are normal. Pancreas: Unremarkable. No pancreatic ductal dilatation or surrounding inflammatory changes. Spleen: Normal in size without focal abnormality. Adrenals/Urinary Tract: Adrenal glands are unremarkable. Kidneys are normal, without renal calculi, focal lesion, or hydronephrosis. Bladder is unremarkable. Stomach/Bowel: Stomach is within normal limits. Appendix appears normal. No evidence of bowel wall thickening, distention, or inflammatory changes. Vascular/Lymphatic: No significant vascular findings are present. No enlarged abdominal or pelvic lymph nodes. Reproductive: Status post hysterectomy. Cystic structure demonstrated in the lateral right pelvis measuring 2.3 cm diameter. This is probably the right ovary demonstrating lateral displacement post operatively. Cyst is likely physiologic. Other: No abdominal wall hernia or abnormality. No abdominopelvic ascites. Musculoskeletal: No acute or significant osseous findings. IMPRESSION: 1. No acute process demonstrated in the abdomen or pelvis. No evidence of bowel obstruction or inflammation. 2. Mild fatty infiltration of the liver with focal fat in the left lobe. 3. Previous hysterectomy. Right ovary appears to be displaced laterally with 2.3 cm diameter cyst, likely physiologic. No follow-up imaging recommended. Note: This recommendation does not apply to premenarchal patients and to  those with increased risk (genetic, family history, elevated tumor markers or other high-risk factors) of ovarian cancer. Reference: JACR 2020 Feb; 17(2):248-254 Electronically Signed   By: Elsie Gravely M.D.   On: 04/16/2024 17:18     Procedures   Medications Ordered in the ED  iohexol  (OMNIPAQUE ) 300 MG/ML solution 80 mL (80 mLs Intravenous Contrast Given 04/16/24 1646)  ketorolac  (TORADOL ) 15 MG/ML injection 15 mg (15 mg Intravenous Given 04/16/24 1742)                                    Medical Decision Making Amount and/or Complexity of Data Reviewed Labs: ordered. Radiology: ordered.  Risk Prescription drug management.   Patient presents to the ED for concern of low back pain, this involves an extensive number of treatment options, and is a complaint that carries with it a high risk of complications and morbidity.  The differential diagnosis includes equina syndrome, musculoskeletal strain, kidney stone, mass, etc.  Co morbidities that complicate the patient evaluation  obesity, premenstrual dysphoric disorder, major depressive disorder, chronic low back pain without sciatica, generalized anxiety disorder, history of laparoscopic-assisted vaginal hysterectomy   Additional history obtained:  Chart review reviewed where patient had a recent hysterectomy completed on 03/06/2024   Lab Tests:  I Ordered, and personally interpreted labs.  The pertinent results include: Urinalysis not consistent with infection or kidney stone, pregnancy negative, CBC unremarkable, CMP unremarkable   Imaging Studies ordered:  I ordered imaging studies including CT L-spine no charge, CT abdomen pelvis with contrast I independently visualized and interpreted imaging which showed no acute abnormality of the lumbar spine, no acute process demonstrated in the abdomen or pelvis, no evidence of bowel obstruction or inflammation, mild fatty infiltration of the liver, previous hysterectomy, right  ovarian cyst I agree with the radiologist interpretation   Medicines ordered and prescription drug management:  I ordered medication including Toradol  for pain Reevaluation of the patient after these medicines showed that the patient improved I have reviewed the patients home medicines and have made adjustments as needed   Test Considered:  None   Critical Interventions:  None   Problem List / ED Course:  38 year old female presents emergency department with chief complaint of low back pain, status post hysterectomy approximately month ago, denies systemic symptoms On physical exam patient overall well-appearing however does have tenderness with lumbar spine paraspinal muscle area on right and left, left greater than right, neuro logically intact in bilateral upper and lower extremities Lab work reassuring as well as imaging, showed no acute pathology however patient educated on chronic findings including pelvic cyst Low clinical suspicion for acute life-threatening intra-abdominal process or lumbar spinous process, low clinical suspicion for cauda equina syndrome as patient is ambulatory without assistance has no urinary/bowel symptoms and no groin numbness/tingling Plan to continue to treat what appears to be musculoskeletal etiology of pain, due to previous outpatient regimen not working will try steroids time and change up anti-inflammatory medication, muscle relaxant and give Lidoderm  patches again, patient has good follow-up with surgeon on Tuesday, instructed her to make this provider aware of emergency department visit and workup today Return precautions given Patient discharged Unknown diagnosis for low back pain at this time however suspicious that it may be postsurgical in etiology, we will attempt to symptomatically treat with strict return precautions, however physical exam and workup today reassuring   Reevaluation:  After the interventions noted above, I reevaluated  the patient and found that they have :improved   Social Determinants of Health:  None   Dispostion:  After consideration of the diagnostic results and the patients response to treatment, I feel that the patent would benefit from discharge and outpatient therapy as described, follow-up with surgeon on Tuesday.       Final diagnoses:  Acute left-sided low back pain without sciatica  Periumbilical abdominal pain  Status post hysterectomy    ED Discharge Orders          Ordered    predniSONE  (STERAPRED UNI-PAK 21 TAB) 10 MG (21) TBPK tablet  Daily        04/16/24 1924    cyclobenzaprine  (FLEXERIL ) 5 MG tablet  3 times daily PRN        04/16/24 1924    meloxicam  (MOBIC ) 7.5 MG tablet  Daily        04/16/24 1924    lidocaine  (LIDODERM ) 5 %  Every 24 hours        04/16/24 1924  Janetta Terrall FALCON, PA-C 04/16/24 2106    Ruthe Cornet, DO 04/16/24 2241

## 2024-04-16 NOTE — ED Notes (Signed)
 Call to lab to add pregnancy to urine already sent

## 2024-04-16 NOTE — ED Triage Notes (Signed)
 Pt caox4, ambulatory c/o R lower back pain for approx 1-1.5 wks. Denies any trauma or injury.

## 2024-04-23 ENCOUNTER — Other Ambulatory Visit: Payer: Self-pay | Admitting: Family Medicine

## 2024-06-24 ENCOUNTER — Encounter: Payer: Self-pay | Admitting: Family Medicine

## 2024-06-24 NOTE — Telephone Encounter (Signed)
 Patient was last seen 10/09/23. Ok to have drop off form or do you need to see in office to fill out?

## 2024-06-25 ENCOUNTER — Telehealth: Payer: Self-pay | Admitting: Family Medicine

## 2024-06-25 NOTE — Telephone Encounter (Signed)
 Type of form received: FMLA    Additional comments:   Received ab:[id number]     Form should be Faxed to: no fax number    Form should be mailed to:     Is patient requesting call for pickup: yes please call to pick up      Form placed:  In [employer] charge sheet. Y    Individual made aware of 3-5 business day turn around (Y/N)? Y

## 2024-06-26 NOTE — Telephone Encounter (Signed)
 Forms received   Will reach out to patient when forms are complete to pick up and give to employer as no fax number was given.  Forms placed on providers desk for review.

## 2024-06-27 DIAGNOSIS — Z0279 Encounter for issue of other medical certificate: Secondary | ICD-10-CM

## 2024-06-27 NOTE — Telephone Encounter (Signed)
 Completed forms received   Copy sent to scan.  Two copies left at the front desk for patient to pick up for employer and self.  Pt. Notified via phone call, will pick up.

## 2024-07-01 NOTE — Telephone Encounter (Signed)
 Patient picked up paperwork.

## 2024-08-05 ENCOUNTER — Ambulatory Visit: Payer: Self-pay

## 2024-08-05 NOTE — Telephone Encounter (Signed)
 Called Tiffany Donovan and offered her an appointment today at 3:00 pm with Dr. Avelina.  She is unable to come in this afternoon due to work.  She will keep appointment at [facility] as scheduled for tomorrow.

## 2024-08-05 NOTE — Telephone Encounter (Signed)
 FYI Only or Action Required?: FYI only for provider: appointment scheduled on 08/06/24.  Patient was last seen in primary care on 10/09/2023 by Avelina Greig BRAVO, MD.  Called Nurse Triage reporting Head Injury.  Symptoms began 2 days ago.  Interventions attempted: OTC medications: tylenol  and Goody's or BC powder.  Symptoms are: stable.  Triage Disposition: See PCP When Office is Open (Within 3 Days)  Patient/caregiver understands and will follow disposition?: Yes                  Copied from CRM #[id number]. Topic: Clinical - Red Word Triage >> Aug 05, 2024 12:22 PM Mia F wrote: Red Word that prompted transfer to Nurse Triage: A box fell on her head on Sunday and since she has had bad headaches that are like migraines. She says when it happened she had a little dizziness/lightheadedness. She's says she has been nauseous on and off. There is not a cut or bumps where she was hit but it is tender to touch. Reason for Disposition  [1] After 3 days AND [2] headache persists  Answer Assessment - Initial Assessment Questions 1. MECHANISM: How did the injury happen? For falls, ask: What height did you fall from? and What surface did you fall against?      She is a truck driver for [employer]. [employer] box about 5-6 pounds fell off pallet about 7 feet up. Corner of box fell on top of her head and hit her sunglasses.   2. ONSET: When did the injury happen? (e.g., minutes, hours ago)      Sunday.  3. NEUROLOGIC SYMPTOMS: Was there any loss of consciousness? Are there any other neurological symptoms?      No.  4. MENTAL STATUS: Does the person know who they are, who you are, and where they are?      Alert oriented x4 at this time.  5. LOCATION: What part of the head was hit?      Top of head  6. SCALP APPEARANCE: What does the scalp look like? Is it bleeding now? If Yes, ask: Is it difficult to stop?      No bleeding, bruising, swelling. 7. SIZE: For cuts,  bruises, or swelling, ask: How large is it? (e.g., inches or centimeters)      None.  8. PAIN: Is there any pain? If Yes, ask: How bad is it? (Scale 0-10; or none, mild, moderate, severe)     6/10; treating with Tylenol , Goodys or BC powder  9. TETANUS: For any breaks in the skin, ask: When was your last tetanus booster?     N/A.  10. BLOOD THINNERS: Do you take any blood thinners? (e.g., aspirin, clopidogrel / Plavix, coumadin, heparin). Notes: Other strong blood thinners include: Arixtra (fondaparinux), Eliquis (apixaban), Pradaxa (dabigatran), and Xarelto (rivaroxaban).       No.  11. OTHER SYMPTOMS: Do you have any other symptoms? (e.g., neck pain, vomiting)       Migraine headache started yesterday; Back of head and neck pinching sensation.Vomiting this morning after eating pasta last night ; same episode happened last week after eating pasta No neck stiffness, changes in vision, slurred speech or changes in speech, numbness or weakness, current dizziness/lightheaded  Protocols used: Head Injury-A-AH

## 2024-08-06 ENCOUNTER — Encounter: Payer: Self-pay | Admitting: Family Medicine

## 2024-08-06 ENCOUNTER — Ambulatory Visit: Admitting: Family Medicine

## 2024-08-06 VITALS — BP 122/68 | HR 66 | Resp 18 | Ht 63.0 in | Wt 185.3 lb

## 2024-08-06 DIAGNOSIS — M542 Cervicalgia: Secondary | ICD-10-CM

## 2024-08-06 MED ORDER — ONDANSETRON HCL 4 MG PO TABS: 4.0000 mg | ORAL_TABLET | Freq: Three times a day (TID) | ORAL | 0 refills | Status: AC | PRN

## 2024-08-06 MED ORDER — CYCLOBENZAPRINE HCL 5 MG PO TABS: 5.0000 mg | ORAL_TABLET | Freq: Three times a day (TID) | ORAL | 0 refills | Status: AC | PRN

## 2024-08-06 NOTE — Progress Notes (Signed)
 Name: Tiffany Donovan   MRN: [id number]    DOB: Jun 14, 1986   Date:08/06/2024       Progress Note  Subjective  Chief Complaint  Chief Complaint  Patient presents with   Headache    Box fell on head at work on sunday   Neck Pain    Discussed the use of AI scribe software for clinical note transcription with the patient, who gave verbal consent to proceed.  History of Present Illness The patient presents with neck pain following a work-related head injury.  She experienced a work-related head injury when a box weighing approximately five to six pounds fell on her head on Sunday. The box struck her glasses, causing them to press into her head. She did not lose consciousness, fall, or experience dizziness, but felt the need to take a moment to recover.  Since the incident, she has been experiencing neck pain, described as tension in the back of her neck. She has been managing the pain with Tylenol , BC powder, and a heating pad. No tingling, numbness, or weakness in her arms. She reports some pain with neck movement but has a good range of motion   She has a history of migraines and believes the incident may have triggered a flare-up. She is currently taking Topiramate  as a preventative medication for migraines and uses Zofran  as needed, although she is currently out of Zofran . She has previously used Flexeril  for muscle relaxation and prefers it over other muscle relaxers she has tried.    Patient Active Problem List   Diagnosis Date Noted   History of laparoscopic-assisted vaginal hysterectomy 03/07/2024   Fibroids 03/06/2024   Preop testing 03/05/2024   Acute non-recurrent maxillary sinusitis 01/03/2023   Stress incontinence 10/05/2021   Pelvic pain 08/10/2021   Epigastric pain 03/22/2021   LLQ abdominal pain 11/28/2018   Intractable chronic migraine without aura and without status migrainosus 12/29/2016   Low back pain 02/14/2016   Chronic low back pain without sciatica 06/02/2015    GAD (generalized anxiety disorder) 06/02/2015   Moderate major depression (HCC) 04/02/2012   PMDD (premenstrual dysphoric disorder) 02/21/2011   Obesity (BMI 35.0-39.9 without comorbidity) 11/25/2008   Allergic rhinitis due to allergen 11/25/2008    Social History   Tobacco Use   Smoking status: Never   Smokeless tobacco: Never  Substance Use Topics   Alcohol use: Not Currently    Current Medications[1]  Allergies[2]  ROS  Ten systems reviewed and is negative except as mentioned in HPI    Objective  Vitals:   08/06/24 0851  BP: 122/68  Pulse: 66  Resp: 18  SpO2: 99%  Weight: 185 lb 4.8 oz (84.1 kg)  Height: 5' 3 (1.6 m)    Body mass index is 32.82 kg/m.   Physical Exam CONSTITUTIONAL: Patient appears well-developed and well-nourished. No distress. HEENT: Head atraumatic, normocephalic, neck supple and non-tender. Full rom CARDIOVASCULAR: Normal rate, regular rhythm and normal heart sounds. No murmur heard. No BLE edema. PULMONARY: Effort normal and breath sounds normal. No respiratory distress. ABDOMINAL: There is no tenderness or distention. MUSCULOSKELETAL: Normal gait. Without gross motor or sensory deficit. PSYCHIATRIC: Patient has a normal mood and affect. Behavior is normal. Judgment and thought content normal.    Assessment & Plan Cervicalgia Pain likely musculoskeletal. No neurological symptoms. - Prescribed Flexeril  5 mg, 30 tablets, up to three times daily as needed, preferably at night. - Advised against beauty powders due to rebound headaches and gastrointestinal issues. - Recommended Tylenol   for pain. - Suggested occasional ibuprofen  or naproxen for additional relief.  Migraine Migraine managed with Topiramate . Possible flare-up due to recent incident. - Refilled Zofran  prescription for management.            [1]  Current Outpatient Medications:    acetaminophen  (TYLENOL ) 500 MG tablet, Take 2 tablets (1,000 mg total) by mouth  every 6 (six) hours as needed., Disp: 30 tablet, Rfl: 0   butalbital -acetaminophen -caffeine  (FIORICET ) 50-325-40 MG tablet, Take 2 tablets by mouth every 6 (six) hours as needed for headache., Disp: 14 tablet, Rfl: 0   cyclobenzaprine  (FLEXERIL ) 5 MG tablet, Take 2 tablets (10 mg total) by mouth 3 (three) times daily as needed for muscle spasms., Disp: 30 tablet, Rfl: 0   ferrous sulfate  325 (65 FE) MG EC tablet, Take 1 tablet (325 mg total) by mouth daily with breakfast., Disp: 30 tablet, Rfl: 3   lidocaine  (LIDODERM ) 5 %, Place 1 patch onto the skin daily. Remove & Discard patch within 12 hours or as directed by MD, Disp: 30 patch, Rfl: 0   meloxicam  (MOBIC ) 7.5 MG tablet, Take 1 tablet (7.5 mg total) by mouth daily., Disp: 10 tablet, Rfl: 0   ondansetron  (ZOFRAN ) 4 MG tablet, Take 1 tablet (4 mg total) by mouth every 8 (eight) hours as needed for nausea., Disp: 20 tablet, Rfl: 0   ondansetron  (ZOFRAN -ODT) 4 MG disintegrating tablet, Take 1 tablet (4 mg total) by mouth every 8 (eight) hours as needed., Disp: 20 tablet, Rfl: 0   oxyCODONE  (OXY IR/ROXICODONE ) 5 MG immediate release tablet, Take 1 tablet (5 mg total) by mouth every 6 (six) hours as needed for moderate pain (pain score 4-6)., Disp: 30 tablet, Rfl: 0   predniSONE  (STERAPRED UNI-PAK 21 TAB) 10 MG (21) TBPK tablet, Take by mouth daily. Take 6 tabs by mouth daily  for 2 days, then 5 tabs for 2 days, then 4 tabs for 2 days, then 3 tabs for 2 days, 2 tabs for 2 days, then 1 tab by mouth daily for 2 days, Disp: 42 tablet, Rfl: 0   TIRZEPATIDE [place], Inject 0.48 Units into the skin every 7 (seven) days., Disp: , Rfl:    topiramate  (TOPAMAX ) 50 MG tablet, TAKE THREE TABLETS BY MOUTH ONE TIME DAILY AT BEDTIME, Disp: 90 tablet, Rfl: 2   buPROPion  (WELLBUTRIN  XL) 300 MG 24 hr tablet, TAKE ONE TABLET BY MOUTH ONE TIME DAILY (Patient not taking: Reported on 02/27/2024), Disp: 90 tablet, Rfl: 1 [2] No Known Allergies

## 2024-08-26 ENCOUNTER — Other Ambulatory Visit: Payer: Self-pay | Admitting: Family Medicine

## 2024-08-26 DIAGNOSIS — Z13220 Encounter for screening for lipoid disorders: Secondary | ICD-10-CM

## 2024-08-26 NOTE — Telephone Encounter (Signed)
 Please schedule CPE with fasting labs prior for Dr. Ermalene Searing.

## 2024-08-26 NOTE — Telephone Encounter (Signed)
 Called and schedule pt for Cpe / labs

## 2024-09-08 ENCOUNTER — Other Ambulatory Visit

## 2024-09-17 ENCOUNTER — Encounter: Admitting: Family Medicine
